# Patient Record
Sex: Female | Born: 1950
Health system: Southern US, Community
[De-identification: ages and names within clinical notes are randomized; demographics above are authoritative.]

## PROBLEM LIST (undated history)

## (undated) DIAGNOSIS — M1990 Unspecified osteoarthritis, unspecified site: Secondary | ICD-10-CM

## (undated) DIAGNOSIS — K5790 Diverticulosis of intestine, part unspecified, without perforation or abscess without bleeding: Secondary | ICD-10-CM

## (undated) DIAGNOSIS — J449 Chronic obstructive pulmonary disease, unspecified: Secondary | ICD-10-CM

## (undated) DIAGNOSIS — E119 Type 2 diabetes mellitus without complications: Secondary | ICD-10-CM

## (undated) DIAGNOSIS — R011 Cardiac murmur, unspecified: Secondary | ICD-10-CM

## (undated) DIAGNOSIS — N059 Unspecified nephritic syndrome with unspecified morphologic changes: Secondary | ICD-10-CM

## (undated) DIAGNOSIS — I10 Essential (primary) hypertension: Secondary | ICD-10-CM

## (undated) DIAGNOSIS — M1612 Unilateral primary osteoarthritis, left hip: Secondary | ICD-10-CM

## (undated) HISTORY — DX: Unspecified osteoarthritis, unspecified site: M19.90

## (undated) HISTORY — PX: TOTAL HIP ARTHROPLASTY: SHX124

---

## 1999-05-05 ENCOUNTER — Other Ambulatory Visit: Admission: RE | Admit: 1999-05-05 | Discharge: 1999-05-05 | Payer: Self-pay | Admitting: *Deleted

## 1999-10-03 ENCOUNTER — Encounter: Payer: Self-pay | Admitting: Family Medicine

## 1999-10-03 ENCOUNTER — Ambulatory Visit (HOSPITAL_COMMUNITY): Admission: RE | Admit: 1999-10-03 | Discharge: 1999-10-03 | Payer: Self-pay | Admitting: Family Medicine

## 1999-10-30 ENCOUNTER — Encounter: Payer: Self-pay | Admitting: *Deleted

## 1999-10-30 ENCOUNTER — Encounter: Admission: RE | Admit: 1999-10-30 | Discharge: 1999-10-30 | Payer: Self-pay | Admitting: *Deleted

## 2001-07-06 ENCOUNTER — Encounter: Payer: Self-pay | Admitting: *Deleted

## 2001-07-06 ENCOUNTER — Encounter: Admission: RE | Admit: 2001-07-06 | Discharge: 2001-07-06 | Payer: Self-pay | Admitting: *Deleted

## 2002-10-17 ENCOUNTER — Encounter: Admission: RE | Admit: 2002-10-17 | Discharge: 2002-10-17 | Payer: Self-pay | Admitting: *Deleted

## 2002-10-17 ENCOUNTER — Encounter: Payer: Self-pay | Admitting: *Deleted

## 2002-12-05 ENCOUNTER — Other Ambulatory Visit: Admission: RE | Admit: 2002-12-05 | Discharge: 2002-12-05 | Payer: Self-pay | Admitting: *Deleted

## 2004-04-20 ENCOUNTER — Encounter: Admission: RE | Admit: 2004-04-20 | Discharge: 2004-04-20 | Payer: Self-pay | Admitting: Family Medicine

## 2004-04-24 ENCOUNTER — Other Ambulatory Visit: Admission: RE | Admit: 2004-04-24 | Discharge: 2004-04-24 | Payer: Self-pay | Admitting: *Deleted

## 2004-04-29 ENCOUNTER — Encounter: Admission: RE | Admit: 2004-04-29 | Discharge: 2004-04-29 | Payer: Self-pay | Admitting: Family Medicine

## 2004-06-09 ENCOUNTER — Encounter: Admission: RE | Admit: 2004-06-09 | Discharge: 2004-09-07 | Payer: Self-pay

## 2004-07-22 ENCOUNTER — Encounter (INDEPENDENT_AMBULATORY_CARE_PROVIDER_SITE_OTHER): Payer: Self-pay | Admitting: Specialist

## 2004-07-22 ENCOUNTER — Ambulatory Visit (HOSPITAL_COMMUNITY): Admission: RE | Admit: 2004-07-22 | Discharge: 2004-07-22 | Payer: Self-pay | Admitting: Gastroenterology

## 2004-10-04 HISTORY — PX: COLONOSCOPY W/ POLYPECTOMY: SHX1380

## 2005-09-08 ENCOUNTER — Encounter: Admission: RE | Admit: 2005-09-08 | Discharge: 2005-09-08 | Payer: Self-pay | Admitting: Family Medicine

## 2006-05-06 ENCOUNTER — Other Ambulatory Visit: Admission: RE | Admit: 2006-05-06 | Discharge: 2006-05-06 | Payer: Self-pay | Admitting: *Deleted

## 2006-09-23 ENCOUNTER — Encounter: Admission: RE | Admit: 2006-09-23 | Discharge: 2006-09-23 | Payer: Self-pay | Admitting: Family Medicine

## 2007-09-14 ENCOUNTER — Other Ambulatory Visit: Admission: RE | Admit: 2007-09-14 | Discharge: 2007-09-14 | Payer: Self-pay | Admitting: Family Medicine

## 2007-10-30 ENCOUNTER — Encounter: Admission: RE | Admit: 2007-10-30 | Discharge: 2007-10-30 | Payer: Self-pay | Admitting: Family Medicine

## 2008-05-04 ENCOUNTER — Encounter: Admission: RE | Admit: 2008-05-04 | Discharge: 2008-05-04 | Payer: Self-pay | Admitting: Family Medicine

## 2008-09-17 ENCOUNTER — Other Ambulatory Visit: Admission: RE | Admit: 2008-09-17 | Discharge: 2008-09-17 | Payer: Self-pay | Admitting: Family Medicine

## 2008-11-06 ENCOUNTER — Encounter: Admission: RE | Admit: 2008-11-06 | Discharge: 2008-11-06 | Payer: Self-pay | Admitting: Family Medicine

## 2009-02-13 ENCOUNTER — Encounter: Admission: RE | Admit: 2009-02-13 | Discharge: 2009-02-13 | Payer: Self-pay | Admitting: Sports Medicine

## 2009-05-27 ENCOUNTER — Inpatient Hospital Stay (HOSPITAL_COMMUNITY): Admission: RE | Admit: 2009-05-27 | Discharge: 2009-05-30 | Payer: Self-pay | Admitting: Orthopedic Surgery

## 2009-11-19 ENCOUNTER — Encounter: Admission: RE | Admit: 2009-11-19 | Discharge: 2009-11-19 | Payer: Self-pay | Admitting: Family Medicine

## 2010-11-11 ENCOUNTER — Other Ambulatory Visit: Payer: Self-pay | Admitting: Family Medicine

## 2010-11-11 DIAGNOSIS — Z1231 Encounter for screening mammogram for malignant neoplasm of breast: Secondary | ICD-10-CM

## 2010-11-23 ENCOUNTER — Ambulatory Visit
Admission: RE | Admit: 2010-11-23 | Discharge: 2010-11-23 | Disposition: A | Discharge status: Home / Self Care | Payer: 59 | Source: Ambulatory Visit | Attending: Family Medicine | Admitting: Family Medicine

## 2010-11-23 DIAGNOSIS — Z1231 Encounter for screening mammogram for malignant neoplasm of breast: Secondary | ICD-10-CM

## 2011-01-09 LAB — BASIC METABOLIC PANEL
BUN: 3 mg/dL — ABNORMAL LOW (ref 6–23)
BUN: 4 mg/dL — ABNORMAL LOW (ref 6–23)
BUN: 7 mg/dL (ref 6–23)
CO2: 31 mEq/L (ref 19–32)
Calcium: 9.8 mg/dL (ref 8.4–10.5)
Chloride: 101 mEq/L (ref 96–112)
Chloride: 103 mEq/L (ref 96–112)
GFR calc Af Amer: >60 mL/min (ref >60)
GFR calc Af Amer: >60 mL/min (ref >60)
GFR calc non Af Amer: >60 mL/min (ref >60)
GFR calc non Af Amer: >60 mL/min (ref >60)
Glucose, Bld: 152 mg/dL — ABNORMAL HIGH (ref 70–99)
Glucose, Bld: 229 mg/dL — ABNORMAL HIGH (ref 70–99)
Potassium: 3.9 mEq/L (ref 3.5–5.1)
Potassium: 4 mEq/L (ref 3.5–5.1)
Potassium: 4.1 mEq/L (ref 3.5–5.1)
Potassium: 4.1 mEq/L (ref 3.5–5.1)
Sodium: 133 mEq/L — ABNORMAL LOW (ref 135–145)
Sodium: 134 mEq/L — ABNORMAL LOW (ref 135–145)
Sodium: 140 mEq/L (ref 135–145)

## 2011-01-09 LAB — URINALYSIS, ROUTINE W REFLEX MICROSCOPIC
Bilirubin Urine: NEGATIVE
Glucose, UA: 250 mg/dL — AB
Hgb urine dipstick: NEGATIVE
Ketones, ur: NEGATIVE mg/dL
Specific Gravity, Urine: 1.026 (ref 1.005–1.030)
pH: 5.5 (ref 5.0–8.0)

## 2011-01-09 LAB — CBC
HCT: 29 % — ABNORMAL LOW (ref 36.0–46.0)
Hemoglobin: 14.7 g/dL (ref 12.0–15.0)
MCHC: 34.1 g/dL (ref 30.0–36.0)
MCHC: 34.7 g/dL (ref 30.0–36.0)
MCV: 90.5 fL (ref 78.0–100.0)
MCV: 90.8 fL (ref 78.0–100.0)
Platelets: 246 10*3/uL (ref 150–400)
RBC: 3.19 MIL/uL — ABNORMAL LOW (ref 3.87–5.11)
RBC: 3.35 MIL/uL — ABNORMAL LOW (ref 3.87–5.11)
RBC: 3.37 MIL/uL — ABNORMAL LOW (ref 3.87–5.11)
RDW: 12.9 % (ref 11.5–15.5)
RDW: 13.4 % (ref 11.5–15.5)
RDW: 13.9 % (ref 11.5–15.5)
WBC: 10.6 10*3/uL — ABNORMAL HIGH (ref 4.0–10.5)
WBC: 11.2 10*3/uL — ABNORMAL HIGH (ref 4.0–10.5)
WBC: 9 10*3/uL (ref 4.0–10.5)

## 2011-01-09 LAB — TYPE AND SCREEN: ABO/RH(D): O POS

## 2011-01-09 LAB — PROTIME-INR
INR: 1.4 (ref 0.00–1.49)
Prothrombin Time: 13.7 seconds (ref 11.6–15.2)
Prothrombin Time: 17.2 seconds — ABNORMAL HIGH (ref 11.6–15.2)
Prothrombin Time: 17.2 seconds — ABNORMAL HIGH (ref 11.6–15.2)

## 2011-01-09 LAB — APTT: aPTT: 27 seconds (ref 24–37)

## 2011-02-16 NOTE — Discharge Summary (Signed)
NAME:  Melissa, Waters NO.:  [id number]   MEDICAL RECORD NO.:  [id number]          PATIENT TYPE:  INP   LOCATION:  5018                         FACILITY:  [facility]   PHYSICIAN:  Eulas Post, MD    DATE OF BIRTH:  08/13/1951   DATE OF ADMISSION:  05/27/2009  DATE OF DISCHARGE:  05/30/2009                               DISCHARGE SUMMARY   ADMISSION DIAGNOSIS:  Right hip osteoarthritis.   DISCHARGE DIAGNOSIS:  Right hip osteoarthritis.   PRIMARY PROCEDURE:  Right total hip arthroplasty performed on May 27, 2009.   DISCHARGE MEDICATIONS:  Coumadin, Phenergan, Percocet, and resume home  medications.   HOSPITAL COURSE:  Mrs. [name] is a 60 year old woman who had end-  stage right hip osteoarthritis.  She elected to undergo the above-named  procedures.  She tolerated the procedure well and did not have any  complications postoperatively.  Her pain was initially controlled on a  PCA Dilaudid, and then switched to p.o. analgesics.  She also received  perioperative Ancef for antimicrobial prophylaxis.  She had sequential  compression devices, early ambulation, and Coumadin for DVT prophylaxis.  She had made significant strides with physical therapy and her wounds  were clean, dry, and intact throughout her hospital stay.  Her  hemoglobin remained stable at approximately 10.  She benefited maximally  from her hospital stay and is being discharged home with followup with  me in approximately 2 weeks.  She will observe posterior total hip  precautions.      Eulas Post, MD  Electronically Signed     JPL/MEDQ  D:  05/29/2009  T:  05/30/2009  Job:  [id number]

## 2011-02-16 NOTE — Op Note (Signed)
NAME:  Melissa Waters, Melissa Waters NO.:  [id number]   MEDICAL RECORD NO.:  [id number]          PATIENT TYPE:  INP   LOCATION:  5018                         FACILITY:  [facility]   PHYSICIAN:  Eulas Post, MD    DATE OF BIRTH:  01-31-1951   DATE OF PROCEDURE:  05/27/2009  DATE OF DISCHARGE:                               OPERATIVE REPORT   ATTENDING SURGEON:  Eulas Post, MD   ASSISTANT:  Laural Benes. Su Hilt, PA-C   PREOPERATIVE DIAGNOSIS:  Right hip osteoarthritis.   POSTOPERATIVE DIAGNOSIS:  Right hip osteoarthritis.   OPERATIVE PROCEDURE:  Right total hip arthroplasty.   OPERATIVE IMPLANTS:  DePuy Pinnacle sector II size 50-mm acetabular cup  with a size 32-mm +4 10 degrees lipped acetabular liner pinnacle  marathon, and a single 6.5 x 25-mm cancellous bone screw with a size 4  standard femoral stem noncemented and an Articuleze +9 x 32-mm femoral  head.   PREOPERATIVE INDICATIONS:  Melissa Waters is a 60 year old woman who  complained of severe debilitating right hip pain.  She elected to  undergo a right total hip arthroplasty after failing conservative  measures.  The risks, benefits and alternatives were discussed  preoperatively including but not limited to risks of infection,  bleeding, nerve injury, the need for revision surgery, hip dislocation,  leg-length discrepancy, dislocation, cardiopulmonary complications,  blood clots, among others and she was willing to proceed.   OPERATIVE FINDINGS:  There was grade 4 chondromalacia of the femoral  head with severe deformity, as well as abnormalities on the acetabulum.   OPERATIVE PROCEDURE:  The patient was brought to the operating room and  placed in the supine position.  General anesthesia was administered.  Intravenous Ancef 1 g was given.  A Foley was placed.  The patient was  turned to the lateral decubitus position and all bony prominences were  padded.  Posterolateral approach was performed after a  sterile prep and  drape.  The external rotators were tagged along with the capsule.  A T-  type capsulotomy was performed.  The hip was dislocated and the femoral  neck resected.  She actually had a fairly short femoral neck.  I left  slightly less than a fingerbreadth, because a fingerbreadth actually was  the entire length of the femoral neck.  A cutting guide was also  utilized.  The femoral head was removed and the acetabulum exposed.  The  labrum was excised.  A metallic wing was used as a tractor.  This was  removed at the completion of the case.  The acetabulum was cleared of  all debris and the ligamentum teres removed.  I sequentially reamed up  to a size 49.  Initially, I placed the size 50 acetabular cup, but I  felt that the cup ended up somewhat flat, and I took an intraoperative x-  ray to confirm.  I then repositioned the cup into a more appropriate  position and secured this with the screw confirming the placement with  an additional x-ray.  On the first x-ray, I had also prepared the  femur  and trialed the +9, which eliminated the shock and provided good  stability and better restoration of leg length.  This was seen on the x-  ray as well.   Therefore, I did place a single 25 mm screw into the anterior most hole  on the acetabular cup, which was into the best bone stock.  This  provided satisfactory bite.  We trialed with a neutral as well as a  lipped liner and the lipped liner appeared to have better stability.   We had already prepared the femur.  We used the lateralizing reamer  followed by sequential broaching up to a size 4.  I did feel that she  could possibly have accommodated a 5, however, I did not want to push it  and had excellent stability with the 4 with rotation.  Therefore, the 4  was selected and after we had placed our acetabular liner, we impacted  our femoral component and placed the +9 femoral head.  The hip was now  extremely stable throughout  functional range of motion.  Leg length  restored to an equal position.  The wounds were irrigated copiously.  The capsule and external rotators were repaired through drill holes in  the greater tuberosity using FiberWire.  Excellent capsular closure was  achieved.  We then closed the fascia with Ethibond followed by 3-0  Vicryl for the subcutaneous tissue and 4-0 Monocryl with Steri-Strips  and sterile gauze.  The wounds were injected with Marcaine as well.  She  tolerated the procedure well and there were no complications.      Eulas Post, MD  Electronically Signed     JPL/MEDQ  D:  05/27/2009  T:  05/28/2009  Job:  [id number]

## 2011-02-19 NOTE — Op Note (Signed)
NAME:  Melissa Waters, Melissa Waters NO.:  [id number]   MEDICAL RECORD NO.:  [id number]          PATIENT TYPE:  AMB   LOCATION:  ENDO                         [facility]ACILITY:  [facility]   PHYSICIAN:  John C. Madilyn Fireman, M.D.    DATE OF BIRTH:  [birth year]   DATE OF PROCEDURE:  07/22/2004  DATE OF DISCHARGE:                                 OPERATIVE REPORT   PROCEDURE:  Colonoscopy.   INDICATIONS FOR PROCEDURE:  Average-risk colon cancer screening.   PROCEDURE:  The patient was placed in the left lateral decubitus position  and placed on the pulse monitor with continuous low flow oxygen delivered by  nasal cannula.  She was sedated with 62.5 mcg IV fentanyl and 6 mg IV  Versed.  The Olympus video colonoscope is inserted into the rectum and  advanced to the cecum, confirmed by transillumination at McBurney's point  and visualization of the ileocecal valve and appendiceal orifice.  Prep is  excellent.  The cecum, ascending, and transverse colon appeared normal with  no masses, polyps, diverticula, or other mucosal abnormalities.  Within the  descending colon and sigmoid colon, there were multiple diverticula, most  concentrated in the sigmoid.  In the proximal rectum, there were 3-4 small  polyps, no more than 5 mm in diameter, and the largest of these was hot  biopsied.  The remainder of the rectum appeared normal.  The scope was then  withdrawn, and the patient returned to the recovery room in stable  condition.  She tolerated the procedure well, and there were no immediate  complications.   IMPRESSION:  1.  Rectal polyp.  2.  Diverticulosis.   PLAN:  Await histology to determine method and interval for future colon  screening.      JCH/MEDQ  D:  07/22/2004  T:  07/22/2004  Job:  [id number]   cc:   Miguel Aschoff, M.D.  [address], [address]  [place]  [place] [address]  Fax: [phone]

## 2011-11-12 ENCOUNTER — Other Ambulatory Visit: Payer: Self-pay | Admitting: Family Medicine

## 2011-11-12 ENCOUNTER — Other Ambulatory Visit (HOSPITAL_COMMUNITY)
Admission: RE | Admit: 2011-11-12 | Discharge: 2011-11-12 | Disposition: A | Discharge status: Home / Self Care | Payer: 59 | Source: Ambulatory Visit | Attending: Family Medicine | Admitting: Family Medicine

## 2011-11-12 DIAGNOSIS — Z124 Encounter for screening for malignant neoplasm of cervix: Secondary | ICD-10-CM | POA: Insufficient documentation

## 2011-12-03 ENCOUNTER — Other Ambulatory Visit: Payer: Self-pay | Admitting: Family Medicine

## 2011-12-03 DIAGNOSIS — Z1231 Encounter for screening mammogram for malignant neoplasm of breast: Secondary | ICD-10-CM

## 2011-12-16 ENCOUNTER — Ambulatory Visit
Admission: RE | Admit: 2011-12-16 | Discharge: 2011-12-16 | Disposition: A | Discharge status: Home / Self Care | Payer: 59 | Source: Ambulatory Visit | Attending: Family Medicine | Admitting: Family Medicine

## 2011-12-16 DIAGNOSIS — Z1231 Encounter for screening mammogram for malignant neoplasm of breast: Secondary | ICD-10-CM

## 2013-01-04 ENCOUNTER — Other Ambulatory Visit: Payer: Self-pay

## 2013-01-04 DIAGNOSIS — Z1231 Encounter for screening mammogram for malignant neoplasm of breast: Secondary | ICD-10-CM

## 2013-02-01 ENCOUNTER — Ambulatory Visit
Admission: RE | Admit: 2013-02-01 | Discharge: 2013-02-01 | Disposition: A | Discharge status: Home / Self Care | Payer: 59 | Source: Ambulatory Visit

## 2013-02-01 DIAGNOSIS — Z1231 Encounter for screening mammogram for malignant neoplasm of breast: Secondary | ICD-10-CM

## 2013-05-23 ENCOUNTER — Encounter (HOSPITAL_COMMUNITY): Payer: Self-pay | Admitting: Pharmacy Technician

## 2013-05-26 NOTE — Pre-Procedure Instructions (Signed)
Melissa Waters  05/26/2013   Your procedure is scheduled on:  September 2  Report to [facility] at 05:30 AM.  Call this number if you have problems the morning of surgery: [phone]   Remember:   Do not eat food or drink liquids after midnight.   Take these medicines the morning of surgery with A SIP OF WATER: None   STOP Calcium, Cinnamon, Ibuprofen, Milk Thistle, Multiple Vitamins Wednesday August 27  Do not take Aspirin, Aleve, Naproxen, Advil, Ibuprofen, Vitamin, Herbs, or Supplements starting August 27  Do not wear jewelry, make-up or nail polish.  Do not wear lotions, powders, or perfumes. You may wear deodorant.  Do not shave 48 hours prior to surgery. Men may shave face and neck.  Do not bring valuables to the hospital.  [facility] is not responsible                   for any belongings or valuables.  Contacts, dentures or bridgework may not be worn into surgery.  Leave suitcase in the car. After surgery it may be brought to your room.  For patients admitted to the hospital, checkout time is 11:00 AM the day of  discharge.   Special Instructions: Shower using CHG 2 nights before surgery and the night before surgery.  If you shower the day of surgery use CHG.  Use special wash - you have one bottle of CHG for all showers.  You should use approximately 1/3 of the bottle for each shower.   Please read over the following fact sheets that you were given: Pain Booklet, Coughing and Deep Breathing, Blood Transfusion Information, Total Joint Packet and Surgical Site Infection Prevention

## 2013-05-28 ENCOUNTER — Encounter (HOSPITAL_COMMUNITY)
Admission: RE | Admit: 2013-05-28 | Discharge: 2013-05-28 | Disposition: A | Discharge status: Home / Self Care | Payer: 59 | Source: Ambulatory Visit | Attending: Orthopedic Surgery | Admitting: Orthopedic Surgery

## 2013-05-28 ENCOUNTER — Encounter (HOSPITAL_COMMUNITY): Payer: Self-pay

## 2013-05-28 ENCOUNTER — Encounter (HOSPITAL_COMMUNITY)
Admission: RE | Admit: 2013-05-28 | Discharge: 2013-05-28 | Disposition: A | Discharge status: Home / Self Care | Payer: 59 | Source: Ambulatory Visit | Attending: Anesthesiology | Admitting: Anesthesiology

## 2013-05-28 ENCOUNTER — Other Ambulatory Visit: Payer: Self-pay | Admitting: Orthopedic Surgery

## 2013-05-28 DIAGNOSIS — Z01811 Encounter for preprocedural respiratory examination: Secondary | ICD-10-CM | POA: Insufficient documentation

## 2013-05-28 DIAGNOSIS — Z01812 Encounter for preprocedural laboratory examination: Secondary | ICD-10-CM | POA: Insufficient documentation

## 2013-05-28 DIAGNOSIS — Z01818 Encounter for other preprocedural examination: Secondary | ICD-10-CM | POA: Insufficient documentation

## 2013-05-28 DIAGNOSIS — Z01810 Encounter for preprocedural cardiovascular examination: Secondary | ICD-10-CM | POA: Insufficient documentation

## 2013-05-28 HISTORY — DX: Essential (primary) hypertension: I10

## 2013-05-28 HISTORY — DX: Type 2 diabetes mellitus without complications: E11.9

## 2013-05-28 HISTORY — DX: Cardiac murmur, unspecified: R01.1

## 2013-05-28 HISTORY — DX: Chronic obstructive pulmonary disease, unspecified: J44.9

## 2013-05-28 LAB — BASIC METABOLIC PANEL
Calcium: 9.9 mg/dL (ref 8.4–10.5)
GFR calc non Af Amer: >90 mL/min (ref >90)
Sodium: 141 mEq/L (ref 135–145)

## 2013-05-28 LAB — PROTIME-INR: Prothrombin Time: 12.1 seconds (ref 11.6–15.2)

## 2013-05-28 LAB — APTT: aPTT: 30 seconds (ref 24–37)

## 2013-05-28 LAB — CBC
MCH: 31.6 pg (ref 26.0–34.0)
Platelets: 257 10*3/uL (ref 150–400)
RBC: 4.49 MIL/uL (ref 3.87–5.11)
RDW: 13.3 % (ref 11.5–15.5)
WBC: 8.2 10*3/uL (ref 4.0–10.5)

## 2013-05-28 LAB — SURGICAL PCR SCREEN: Staphylococcus aureus: NEGATIVE

## 2013-05-28 LAB — TYPE AND SCREEN: ABO/RH(D): O POS

## 2013-05-29 ENCOUNTER — Encounter (HOSPITAL_COMMUNITY): Payer: Self-pay | Admitting: Vascular Surgery

## 2013-05-29 NOTE — Progress Notes (Signed)
Anesthesia Chart Review:  Patient is a 62 year old female scheduled for left THA on 06/05/13 by Dr. Dion Saucier.  History includes smoking, HTN, COPD, diet controlled DM2, arthritis, mumur of infancy, right THA '10. BMI is 19.73. PCP is listed as Dr. Vianne Bulls.  EKg on 05/28/13 showed SB @ 58 bpm, marked sinus arrhythmia, T wave abnormality, consider inferolateral ischemia.  Anterior T wave abnormality has improved, but lateral T wave abnormality is new since 05/21/09.      CXR on 05/28/13 showed: Questionable nodule in the retrosternal clear space on the lateral examination only. CT of the chest is recommended without contrast for further evaluation.    Preoperative labs noted.  I reviewed above with anesthesiologist Dr. Michelle Piper.  Recommend preoperative cardiology evaluation due to abnormal EKG with multiple CAD risk factors.  I have notified Sherri at Dr. Shelba Flake office of need for cardiac preoperative evaluation and of abnormal CXR report.  She will review with Dr. Dion Saucier and make arrangements as necessary.  Velna Ochs [facility]/Anesthesiology Phone [phone] 05/29/2013 4:47 PM

## 2013-05-31 ENCOUNTER — Other Ambulatory Visit: Payer: Self-pay | Admitting: Orthopedic Surgery

## 2013-05-31 DIAGNOSIS — R911 Solitary pulmonary nodule: Secondary | ICD-10-CM

## 2013-05-31 DIAGNOSIS — M25551 Pain in right hip: Secondary | ICD-10-CM

## 2013-06-01 ENCOUNTER — Other Ambulatory Visit: Payer: 59

## 2013-06-01 ENCOUNTER — Ambulatory Visit
Admission: RE | Admit: 2013-06-01 | Discharge: 2013-06-01 | Disposition: A | Discharge status: Home / Self Care | Payer: 59 | Source: Ambulatory Visit | Attending: Orthopedic Surgery | Admitting: Orthopedic Surgery

## 2013-06-01 ENCOUNTER — Other Ambulatory Visit: Payer: Self-pay | Admitting: Orthopedic Surgery

## 2013-06-01 DIAGNOSIS — M25551 Pain in right hip: Secondary | ICD-10-CM

## 2013-06-01 DIAGNOSIS — R911 Solitary pulmonary nodule: Secondary | ICD-10-CM

## 2013-06-05 ENCOUNTER — Inpatient Hospital Stay (HOSPITAL_COMMUNITY): Admission: RE | Admit: 2013-06-05 | Payer: 59 | Source: Ambulatory Visit | Admitting: Orthopedic Surgery

## 2013-06-05 ENCOUNTER — Encounter (HOSPITAL_COMMUNITY): Admission: RE | Payer: Self-pay | Source: Ambulatory Visit

## 2013-06-05 SURGERY — ARTHROPLASTY, HIP, TOTAL,POSTERIOR APPROACH
Anesthesia: General | Laterality: Left

## 2013-06-06 ENCOUNTER — Encounter: Payer: Self-pay | Admitting: *Deleted

## 2013-06-11 ENCOUNTER — Ambulatory Visit (INDEPENDENT_AMBULATORY_CARE_PROVIDER_SITE_OTHER): Payer: 59 | Admitting: Cardiology

## 2013-06-11 ENCOUNTER — Encounter: Payer: Self-pay | Admitting: Cardiology

## 2013-06-11 VITALS — BP 126/66 | HR 68 | Ht 60.0 in | Wt 100.0 lb

## 2013-06-11 DIAGNOSIS — R9431 Abnormal electrocardiogram [ECG] [EKG]: Secondary | ICD-10-CM

## 2013-06-11 MED ORDER — CARVEDILOL 3.125 MG PO TABS: 3.1250 mg | ORAL_TABLET | Freq: Two times a day (BID) | ORAL | Status: DC

## 2013-06-11 NOTE — Progress Notes (Signed)
Patient ID: TEALE GOODGAME, female   DOB: 16-Jun-1951, 62 y.o.   MRN: [id number]   Patient Name: Melissa Waters Date of Encounter: 06/11/2013  Primary Care Provider:  Daisy Floro, MD Primary Cardiologist:  Tobias Alexander, MD  Patient Profile  Pre-operative evaluation  Problem List   Past Medical History  Diagnosis Date  . Hypertension   . Heart murmur     as a baby  . COPD (chronic obstructive pulmonary disease)   . Diabetes mellitus without complication     no meds   . Osteoarthritis     right hip   Past Surgical History  Procedure Laterality Date  . Total hip arthroplasty Right     05/27/2009  . Colonoscopy w/ polypectomy  2006    Allergies  Allergies  Allergen Reactions  . Amoxil [Amoxicillin] Diarrhea and Nausea And Vomiting    HPI  A delightful 62 year old female with h/o HTN, smoking, FH of CAD who was referred for a pre-operative evaluation. The patient was scheduled for a left hip replacement and as a part of pre-op exams an abnormal ECG was found. The patient is limited with physical activity by her hip pain but is walking with her dog without any chest pain or shortness of breath. She denies any prior chest pain, dizziness, syncope or palpitations as well as orthopnea or PND.  Home Medications  Prior to Admission medications   Medication Sig Start Date End Date Taking? Authorizing Provider  Calcium Carbonate-Vitamin D (CALCIUM 600 + D PO) Take 1 tablet by mouth daily.   Yes Historical Provider, MD  Carboxymethylcellul-Glycerin (CLEAR EYES FOR DRY EYES OP) Place 1 drop into both eyes 2 (two) times daily.   Yes Historical Provider, MD  Cinnamon 500 MG capsule Take 1,000 mg by mouth daily.   Yes Historical Provider, MD  clindamycin (CLEOCIN) 300 MG capsule Take 600 mg by mouth once as needed (prior to dental work).   Yes Historical Provider, MD  ibuprofen (ADVIL,MOTRIN) 200 MG tablet Take 400 mg by mouth 2 (two) times daily as needed for pain.   Yes  Historical Provider, MD  MILK THISTLE PO Take 350 mg by mouth daily.   Yes Historical Provider, MD  quinapril (ACCUPRIL) 20 MG tablet Take 20 mg by mouth daily before breakfast.    Yes Historical Provider, MD  carvedilol (COREG) 3.125 MG tablet Take 1 tablet (3.125 mg total) by mouth 2 (two) times daily. 06/11/13   Lars Masson, MD    Family History  Father - CABG in his 21' Mother - HTN Uncle - fatal MI in his 48'  Social History  History   Social History  . Marital Status: Married    Spouse Name: N/A    Number of Children: N/A  . Years of Education: N/A   Occupational History  . Not on file.   Social History Main Topics  . Smoking status: Current Every Day Smoker -- 0.20 packs/day for 44 years    Types: Cigarettes  . Smokeless tobacco: Never Used  . Alcohol Use: Yes     Comment: social  . Drug Use: No  . Sexual Activity: Not on file   Other Topics Concern  . Not on file   Social History Narrative  . No narrative on file     Review of Systems General:  No chills, fever, night sweats or weight changes.  Cardiovascular:  No chest pain, dyspnea on exertion, edema, orthopnea, palpitations, paroxysmal nocturnal dyspnea. Dermatological:  No rash, lesions/masses Respiratory: No cough, dyspnea Urologic: No hematuria, dysuria Abdominal:   No nausea, vomiting, diarrhea, bright red blood per rectum, melena, or hematemesis Neurologic:  No visual changes, wkns, changes in mental status. All other systems reviewed and are otherwise negative except as noted above.  Physical Exam  Blood pressure 126/66, pulse 68, height 5' (1.524 m), weight 100 lb (45.36 kg).  General: Pleasant, NAD Psych: Normal affect. Neuro: Alert and oriented X 3. Moves all extremities spontaneously. HEENT: Normal  Neck: Supple without bruits or JVD. Lungs:  Resp regular and unlabored, CTA. Heart: RRR no s3, s4, or murmurs. Abdomen: Soft, non-tender, non-distended, BS + x 4.  Extremities: No  clubbing, cyanosis or edema. DP/PT/Radials 2+ and equal bilaterally.  Accessory Clinical Findings  ECG - SR, HR 68/minute, PR 152, QRS 92, QT 418 ms, negative T waves in V3-5, nonspecific changes in I, II,III  Assessment & Plan  62 year old female in need for hip replacement (intermediate risk surgery) with abnormal ECG suggesting possible ischemia. The patient has risk factors including family history of CAD, HTN and ongoing smoking. She has no prior history of CAD. She is completely asymptomatic, has no active cardiac condition and despite her orthopedic limitations she is able to reach at least 4 METS without any symptoms. She feels that postponing the surgery would limit her daily activities. Therefore we will start her on carvedilol 3.125 mg PO BID that she should continue during and after the surgery. We won't proceed any any further testing prior to the surgery unless there is change in her condition or symptoms.  We will follow the patient in our clinic shortly after the surgery.   Tobias Alexander, Rexene Edison, MD 06/11/2013, 1:36 PM

## 2013-06-11 NOTE — Patient Instructions (Addendum)
Fasting Lab  ( lipid panel,tsh ) 06/12/13 7:30 am to 12:00 noon,nothing to eat or drink after midnight tonight 06/11/13.    Start Coreg 3.125 mg twice a day    Your physician recommends that you schedule a follow-up appointment in: 6 weeks

## 2013-06-12 ENCOUNTER — Other Ambulatory Visit (INDEPENDENT_AMBULATORY_CARE_PROVIDER_SITE_OTHER): Payer: 59

## 2013-06-12 DIAGNOSIS — R9431 Abnormal electrocardiogram [ECG] [EKG]: Secondary | ICD-10-CM

## 2013-06-12 LAB — TSH: TSH: 1.21 u[IU]/mL (ref 0.35–5.50)

## 2013-06-12 LAB — LIPID PANEL
Cholesterol: 180 mg/dL (ref 0–200)
HDL: 95.8 mg/dL (ref >39.00)
LDL Cholesterol: 78 mg/dL (ref 0–99)
Total CHOL/HDL Ratio: 2
Triglycerides: 30 mg/dL (ref 0.0–149.0)
VLDL: 6 mg/dL (ref 0.0–40.0)

## 2013-06-21 ENCOUNTER — Ambulatory Visit
Admission: RE | Admit: 2013-06-21 | Discharge: 2013-06-21 | Disposition: A | Discharge status: Home / Self Care | Payer: 59 | Source: Ambulatory Visit | Attending: Orthopedic Surgery | Admitting: Orthopedic Surgery

## 2013-06-21 DIAGNOSIS — M25551 Pain in right hip: Secondary | ICD-10-CM

## 2013-06-21 DIAGNOSIS — R911 Solitary pulmonary nodule: Secondary | ICD-10-CM

## 2013-06-29 ENCOUNTER — Telehealth: Payer: Self-pay | Admitting: Cardiology

## 2013-06-29 NOTE — Telephone Encounter (Signed)
New problem  Pt states that she was advised to have a stress test before her surgery/// she believe her scheduled OVt was a stress test and it is not... There are no orders for a stress test.. Request a call back to discuss why she was not scheduled for a stress test.

## 2013-06-29 NOTE — Telephone Encounter (Signed)
LMTCB

## 2013-07-02 NOTE — Telephone Encounter (Signed)
**Note De-Identified Melissa Waters Obfuscation** Pt thought that she had to have a stress test in order to be cleared for surgery. According to last OV notes Dr Delton See did not feel that she needed a stress test to be cleared for surgery. Pt was to f/u with Dr Delton See on 10/21 (shortly after surgery) but the pt states that her surgery has not been scheduled and will not be until November. She asked that I cancel the 10/21 appt and she states that she will call and reschedule when she finds out when surgery is. Also, last OV notes faxed to Dr. Dion Saucier (surgeon).

## 2013-07-02 NOTE — Telephone Encounter (Signed)
Follow up  ° ° °Returning call back to nurse from Friday  °

## 2013-07-05 ENCOUNTER — Telehealth: Payer: Self-pay | Admitting: Cardiology

## 2013-07-05 NOTE — Telephone Encounter (Signed)
Pt  was told to call with date of surgery,  08-14-13 , would like to talk with lynn, knows it will be tomorrow

## 2013-07-11 NOTE — Telephone Encounter (Signed)
**Note De-identified Melissa Waters Obfuscation** LMTCB

## 2013-07-17 NOTE — Telephone Encounter (Signed)
**Note De-Identified Cinthya Bors Obfuscation** LMTCB. Need to schedule a f/u appt with Dr Delton See after surgery.

## 2013-07-24 ENCOUNTER — Ambulatory Visit: Payer: 59 | Admitting: Cardiology

## 2013-07-27 ENCOUNTER — Telehealth: Payer: Self-pay | Admitting: Cardiology

## 2013-07-27 NOTE — Telephone Encounter (Signed)
New Problem:  Pt states she was wanting to speak to [facility] about her hip surgery and when she would need to follow up w/ Dr. Delton See. Please advise

## 2013-07-31 NOTE — Telephone Encounter (Signed)
Left detailed message on pts VM stating that she can call to schedule her F/u appt with Dr Delton See for 1 to 2 weeks after her surgery with anyone in this office as Im having a hard time reaching her.

## 2013-08-02 ENCOUNTER — Encounter (HOSPITAL_COMMUNITY): Payer: Self-pay | Admitting: Pharmacy Technician

## 2013-08-06 ENCOUNTER — Encounter (HOSPITAL_COMMUNITY): Payer: Self-pay

## 2013-08-06 ENCOUNTER — Encounter (HOSPITAL_COMMUNITY)
Admission: RE | Admit: 2013-08-06 | Discharge: 2013-08-06 | Disposition: A | Discharge status: Home / Self Care | Payer: 59 | Source: Ambulatory Visit | Attending: Orthopedic Surgery | Admitting: Orthopedic Surgery

## 2013-08-06 DIAGNOSIS — Z01812 Encounter for preprocedural laboratory examination: Secondary | ICD-10-CM | POA: Insufficient documentation

## 2013-08-06 DIAGNOSIS — Z01818 Encounter for other preprocedural examination: Secondary | ICD-10-CM | POA: Insufficient documentation

## 2013-08-06 HISTORY — DX: Unspecified nephritic syndrome with unspecified morphologic changes: N05.9

## 2013-08-06 HISTORY — DX: Diverticulosis of intestine, part unspecified, without perforation or abscess without bleeding: K57.90

## 2013-08-06 LAB — PROTIME-INR
INR: 0.96 (ref 0.00–1.49)
Prothrombin Time: 12.6 seconds (ref 11.6–15.2)

## 2013-08-06 LAB — CBC
MCH: 31.3 pg (ref 26.0–34.0)
Platelets: 252 10*3/uL (ref 150–400)
RBC: 4.48 MIL/uL (ref 3.87–5.11)
RDW: 13.1 % (ref 11.5–15.5)

## 2013-08-06 LAB — BASIC METABOLIC PANEL
CO2: 28 mEq/L (ref 19–32)
Calcium: 9.6 mg/dL (ref 8.4–10.5)
Creatinine, Ser: 0.41 mg/dL — ABNORMAL LOW (ref 0.50–1.10)
GFR calc Af Amer: >90 mL/min (ref >90)
GFR calc non Af Amer: >90 mL/min (ref >90)
Sodium: 141 mEq/L (ref 135–145)

## 2013-08-06 LAB — SURGICAL PCR SCREEN: Staphylococcus aureus: NEGATIVE

## 2013-08-06 NOTE — Progress Notes (Signed)
Anesthesia follow-up:  See my noted from 05/28/13.  Since then patient was referred to cardiologist Dr. Tobias Alexander. According to notes from 06/11/13, no further cardiac testing was ordered at that time because patient was asymptomatic from a CV standpoint and could reach at least 4 METS without any symptoms.  She was started on low dose Coreg to continue during the perioperative period.  Her PCP Dr. Duane Lope also cleared her from a medical standpoint.  Of noted, she had an abnormal CXR on 05/28/13 but chest CT was done on 06/01/13 and showed: No evidence of pulmonary mass lesion. Mild well-defined interstitial densities medial inferior anterior right middle lobe most consistent with scarring.  Preoperative labs noted.  If no acute changes then I would anticipate that she could proceed as planned.    Velna Ochs [facility]/Anesthesiology Phone [phone] 08/06/2013 6:30 PM

## 2013-08-07 LAB — TYPE AND SCREEN: Antibody Screen: NEGATIVE

## 2013-08-09 ENCOUNTER — Other Ambulatory Visit: Payer: Self-pay

## 2013-08-12 MED ORDER — VANCOMYCIN HCL IN DEXTROSE 1-5 GM/200ML-% IV SOLN
1000.0000 mg | INTRAVENOUS | Status: AC
  Administered 2013-08-13: 1000 mg via INTRAVENOUS

## 2013-08-13 ENCOUNTER — Encounter (HOSPITAL_COMMUNITY): Payer: Self-pay | Admitting: *Deleted

## 2013-08-13 ENCOUNTER — Inpatient Hospital Stay (HOSPITAL_COMMUNITY): Payer: 59

## 2013-08-13 ENCOUNTER — Encounter (HOSPITAL_COMMUNITY): Payer: 59 | Admitting: Vascular Surgery

## 2013-08-13 ENCOUNTER — Encounter (HOSPITAL_COMMUNITY)
Admission: RE | Disposition: A | Discharge status: Home Health | Payer: Self-pay | Source: Ambulatory Visit | Attending: Orthopedic Surgery

## 2013-08-13 ENCOUNTER — Inpatient Hospital Stay (HOSPITAL_COMMUNITY): Payer: 59 | Admitting: Certified Registered Nurse Anesthetist

## 2013-08-13 ENCOUNTER — Inpatient Hospital Stay (HOSPITAL_COMMUNITY)
Admission: RE | Admit: 2013-08-13 | Discharge: 2013-08-15 | DRG: 470 | Disposition: A | Discharge status: Home Health | Payer: 59 | Source: Ambulatory Visit | Attending: Orthopedic Surgery | Admitting: Orthopedic Surgery

## 2013-08-13 DIAGNOSIS — M1612 Unilateral primary osteoarthritis, left hip: Secondary | ICD-10-CM | POA: Diagnosis present

## 2013-08-13 DIAGNOSIS — E119 Type 2 diabetes mellitus without complications: Secondary | ICD-10-CM | POA: Diagnosis present

## 2013-08-13 DIAGNOSIS — Z23 Encounter for immunization: Secondary | ICD-10-CM

## 2013-08-13 DIAGNOSIS — F17200 Nicotine dependence, unspecified, uncomplicated: Secondary | ICD-10-CM | POA: Diagnosis present

## 2013-08-13 DIAGNOSIS — J4489 Other specified chronic obstructive pulmonary disease: Secondary | ICD-10-CM | POA: Diagnosis present

## 2013-08-13 DIAGNOSIS — Z7982 Long term (current) use of aspirin: Secondary | ICD-10-CM

## 2013-08-13 DIAGNOSIS — I10 Essential (primary) hypertension: Secondary | ICD-10-CM | POA: Diagnosis present

## 2013-08-13 DIAGNOSIS — M1610 Unilateral primary osteoarthritis, unspecified hip: Principal | ICD-10-CM | POA: Diagnosis present

## 2013-08-13 DIAGNOSIS — M169 Osteoarthritis of hip, unspecified: Principal | ICD-10-CM | POA: Diagnosis present

## 2013-08-13 DIAGNOSIS — J449 Chronic obstructive pulmonary disease, unspecified: Secondary | ICD-10-CM | POA: Diagnosis present

## 2013-08-13 DIAGNOSIS — Z79899 Other long term (current) drug therapy: Secondary | ICD-10-CM

## 2013-08-13 DIAGNOSIS — Z96649 Presence of unspecified artificial hip joint: Secondary | ICD-10-CM

## 2013-08-13 HISTORY — DX: Unilateral primary osteoarthritis, left hip: M16.12

## 2013-08-13 HISTORY — PX: TOTAL HIP ARTHROPLASTY: SHX124

## 2013-08-13 LAB — CBC
Hemoglobin: 13 g/dL (ref 12.0–15.0)
MCH: 31.2 pg (ref 26.0–34.0)
MCHC: 33.9 g/dL (ref 30.0–36.0)
MCV: 90 fL (ref 78.0–100.0)
Platelets: 226 10*3/uL (ref 150–400)
Platelets: 219 10*3/uL (ref 150–400)
RDW: 13.1 % (ref 11.5–15.5)
RDW: 13 % (ref 11.5–15.5)

## 2013-08-13 LAB — BASIC METABOLIC PANEL
BUN: 11 mg/dL (ref 6–23)
GFR calc Af Amer: >90 mL/min (ref >90)
GFR calc non Af Amer: >90 mL/min (ref >90)
Glucose, Bld: 122 mg/dL — ABNORMAL HIGH (ref 70–99)
Potassium: 3.9 mEq/L (ref 3.5–5.1)
Sodium: 140 mEq/L (ref 135–145)

## 2013-08-13 LAB — GLUCOSE, CAPILLARY
Glucose-Capillary: 137 mg/dL — ABNORMAL HIGH (ref 70–99)
Glucose-Capillary: 149 mg/dL — ABNORMAL HIGH (ref 70–99)

## 2013-08-13 LAB — CREATININE, SERUM: GFR calc non Af Amer: >90 mL/min (ref >90)

## 2013-08-13 SURGERY — ARTHROPLASTY, HIP, TOTAL,POSTERIOR APPROACH
Anesthesia: General | Site: Hip | Laterality: Left | Wound class: Clean

## 2013-08-13 MED ORDER — ENOXAPARIN SODIUM 40 MG/0.4ML ~~LOC~~ SOLN
40.0000 mg | SUBCUTANEOUS | Status: DC
  Administered 2013-08-14 – 2013-08-15 (×2): 40 mg via SUBCUTANEOUS
  Filled 2013-08-13 (×4): qty 0.4

## 2013-08-13 MED ORDER — HYDROMORPHONE HCL PF 1 MG/ML IJ SOLN: 0.5000 mg | INTRAMUSCULAR | Status: DC | PRN

## 2013-08-13 MED ORDER — ENOXAPARIN SODIUM 40 MG/0.4ML ~~LOC~~ SOLN: 40.0000 mg | SUBCUTANEOUS | Status: DC

## 2013-08-13 MED ORDER — CARVEDILOL 3.125 MG PO TABS
3.1250 mg | ORAL_TABLET | Freq: Two times a day (BID) | ORAL | Status: DC
  Administered 2013-08-13 – 2013-08-15 (×4): 3.125 mg via ORAL
  Filled 2013-08-13 (×5): qty 1

## 2013-08-13 MED ORDER — ZOLPIDEM TARTRATE 5 MG PO TABS: 5.0000 mg | ORAL_TABLET | Freq: Every evening | ORAL | Status: DC | PRN

## 2013-08-13 MED ORDER — MEPERIDINE HCL 25 MG/ML IJ SOLN: 6.2500 mg | INTRAMUSCULAR | Status: DC | PRN

## 2013-08-13 MED ORDER — LACTATED RINGERS IV SOLN
INTRAVENOUS | Status: DC | PRN
  Administered 2013-08-13 (×2): via INTRAVENOUS

## 2013-08-13 MED ORDER — PROPOFOL 10 MG/ML IV BOLUS
INTRAVENOUS | Status: DC | PRN
  Administered 2013-08-13: 100 mg via INTRAVENOUS

## 2013-08-13 MED ORDER — PHENYLEPHRINE HCL 10 MG/ML IJ SOLN
INTRAMUSCULAR | Status: DC | PRN
  Administered 2013-08-13 (×2): 80 ug via INTRAVENOUS

## 2013-08-13 MED ORDER — DOCUSATE SODIUM 100 MG PO CAPS
100.0000 mg | ORAL_CAPSULE | Freq: Two times a day (BID) | ORAL | Status: DC
  Administered 2013-08-13 – 2013-08-15 (×4): 100 mg via ORAL
  Filled 2013-08-13 (×4): qty 1

## 2013-08-13 MED ORDER — HYDROMORPHONE HCL PF 1 MG/ML IJ SOLN
INTRAMUSCULAR | Status: AC
  Filled 2013-08-13: qty 1

## 2013-08-13 MED ORDER — BISACODYL 5 MG PO TBEC: 5.0000 mg | DELAYED_RELEASE_TABLET | Freq: Every day | ORAL | Status: DC | PRN

## 2013-08-13 MED ORDER — OXYCODONE-ACETAMINOPHEN 5-325 MG PO TABS: 1.0000 | ORAL_TABLET | Freq: Four times a day (QID) | ORAL | Status: DC | PRN

## 2013-08-13 MED ORDER — PROMETHAZINE HCL 25 MG/ML IJ SOLN: 6.2500 mg | INTRAMUSCULAR | Status: DC | PRN

## 2013-08-13 MED ORDER — METOCLOPRAMIDE HCL 10 MG PO TABS: 5.0000 mg | ORAL_TABLET | Freq: Three times a day (TID) | ORAL | Status: DC | PRN

## 2013-08-13 MED ORDER — QUINAPRIL HCL 10 MG PO TABS
20.0000 mg | ORAL_TABLET | Freq: Every day | ORAL | Status: DC
  Administered 2013-08-14 – 2013-08-15 (×2): 20 mg via ORAL
  Filled 2013-08-13 (×3): qty 2

## 2013-08-13 MED ORDER — CLINDAMYCIN PHOSPHATE 900 MG/50ML IV SOLN
900.0000 mg | INTRAVENOUS | Status: DC
  Filled 2013-08-13: qty 50

## 2013-08-13 MED ORDER — SODIUM CHLORIDE 0.9 % IR SOLN
Status: DC | PRN
  Administered 2013-08-13: 1000 mL

## 2013-08-13 MED ORDER — OXYCODONE HCL 5 MG/5ML PO SOLN: 5.0000 mg | Freq: Once | ORAL | Status: DC | PRN

## 2013-08-13 MED ORDER — ACETAMINOPHEN 325 MG PO TABS
650.0000 mg | ORAL_TABLET | Freq: Four times a day (QID) | ORAL | Status: DC | PRN
  Administered 2013-08-14 (×2): 650 mg via ORAL
  Filled 2013-08-13 (×2): qty 2

## 2013-08-13 MED ORDER — DIPHENHYDRAMINE HCL 12.5 MG/5ML PO ELIX: 12.5000 mg | ORAL_SOLUTION | ORAL | Status: DC | PRN

## 2013-08-13 MED ORDER — MIDAZOLAM HCL 5 MG/5ML IJ SOLN
INTRAMUSCULAR | Status: DC | PRN
  Administered 2013-08-13: 2 mg via INTRAVENOUS

## 2013-08-13 MED ORDER — BISACODYL 10 MG RE SUPP: 10.0000 mg | Freq: Every day | RECTAL | Status: DC | PRN

## 2013-08-13 MED ORDER — POTASSIUM CHLORIDE IN NACL 20-0.45 MEQ/L-% IV SOLN
INTRAVENOUS | Status: DC
  Administered 2013-08-13 – 2013-08-14 (×2): via INTRAVENOUS
  Filled 2013-08-13 (×5): qty 1000

## 2013-08-13 MED ORDER — VANCOMYCIN HCL IN DEXTROSE 1-5 GM/200ML-% IV SOLN
INTRAVENOUS | Status: AC
  Filled 2013-08-13: qty 200

## 2013-08-13 MED ORDER — SENNA-DOCUSATE SODIUM 8.6-50 MG PO TABS: 2.0000 | ORAL_TABLET | Freq: Every day | ORAL | Status: DC

## 2013-08-13 MED ORDER — POLYETHYLENE GLYCOL 3350 17 G PO PACK: 17.0000 g | PACK | Freq: Every day | ORAL | Status: DC | PRN

## 2013-08-13 MED ORDER — METOCLOPRAMIDE HCL 5 MG/ML IJ SOLN: 5.0000 mg | Freq: Three times a day (TID) | INTRAMUSCULAR | Status: DC | PRN

## 2013-08-13 MED ORDER — OXYCODONE HCL 5 MG PO TABS
5.0000 mg | ORAL_TABLET | ORAL | Status: DC | PRN
  Administered 2013-08-13 – 2013-08-15 (×7): 10 mg via ORAL
  Filled 2013-08-13 (×7): qty 2

## 2013-08-13 MED ORDER — ACETAMINOPHEN 650 MG RE SUPP: 650.0000 mg | Freq: Four times a day (QID) | RECTAL | Status: DC | PRN

## 2013-08-13 MED ORDER — ROCURONIUM BROMIDE 100 MG/10ML IV SOLN
INTRAVENOUS | Status: DC | PRN
  Administered 2013-08-13: 40 mg via INTRAVENOUS

## 2013-08-13 MED ORDER — ONDANSETRON HCL 4 MG/2ML IJ SOLN: 4.0000 mg | Freq: Four times a day (QID) | INTRAMUSCULAR | Status: DC | PRN

## 2013-08-13 MED ORDER — METHOCARBAMOL 100 MG/ML IJ SOLN
500.0000 mg | Freq: Four times a day (QID) | INTRAMUSCULAR | Status: DC | PRN
  Filled 2013-08-13: qty 5

## 2013-08-13 MED ORDER — METHOCARBAMOL 500 MG PO TABS
500.0000 mg | ORAL_TABLET | Freq: Four times a day (QID) | ORAL | Status: DC | PRN
  Administered 2013-08-13 – 2013-08-15 (×3): 500 mg via ORAL
  Filled 2013-08-13 (×3): qty 1

## 2013-08-13 MED ORDER — SENNA 8.6 MG PO TABS
1.0000 | ORAL_TABLET | Freq: Two times a day (BID) | ORAL | Status: DC
  Administered 2013-08-13 – 2013-08-15 (×4): 8.6 mg via ORAL
  Filled 2013-08-13 (×6): qty 1

## 2013-08-13 MED ORDER — ALUM & MAG HYDROXIDE-SIMETH 200-200-20 MG/5ML PO SUSP: 30.0000 mL | ORAL | Status: DC | PRN

## 2013-08-13 MED ORDER — NEOSTIGMINE METHYLSULFATE 1 MG/ML IJ SOLN
INTRAMUSCULAR | Status: DC | PRN
  Administered 2013-08-13: 3 mg via INTRAVENOUS

## 2013-08-13 MED ORDER — GLYCOPYRROLATE 0.2 MG/ML IJ SOLN
INTRAMUSCULAR | Status: DC | PRN
  Administered 2013-08-13: 0.4 mg via INTRAVENOUS

## 2013-08-13 MED ORDER — FENTANYL CITRATE 0.05 MG/ML IJ SOLN
INTRAMUSCULAR | Status: DC | PRN
  Administered 2013-08-13: 150 ug via INTRAVENOUS
  Administered 2013-08-13 (×2): 50 ug via INTRAVENOUS

## 2013-08-13 MED ORDER — LACTATED RINGERS IV SOLN
INTRAVENOUS | Status: DC
  Administered 2013-08-13: 12:00:00 via INTRAVENOUS

## 2013-08-13 MED ORDER — BUPIVACAINE HCL 0.5 % IJ SOLN
INTRAMUSCULAR | Status: DC | PRN
  Administered 2013-08-13: 20 mL

## 2013-08-13 MED ORDER — POLYVINYL ALCOHOL 1.4 % OP SOLN
1.0000 [drp] | Freq: Two times a day (BID) | OPHTHALMIC | Status: DC
  Administered 2013-08-13 – 2013-08-15 (×4): 1 [drp] via OPHTHALMIC
  Filled 2013-08-13: qty 15

## 2013-08-13 MED ORDER — ONDANSETRON HCL 4 MG/2ML IJ SOLN
INTRAMUSCULAR | Status: DC | PRN
  Administered 2013-08-13: 4 mg via INTRAVENOUS

## 2013-08-13 MED ORDER — OXYCODONE HCL 5 MG PO TABS: 5.0000 mg | ORAL_TABLET | Freq: Once | ORAL | Status: DC | PRN

## 2013-08-13 MED ORDER — PHENOL 1.4 % MT LIQD: 1.0000 | OROMUCOSAL | Status: DC | PRN

## 2013-08-13 MED ORDER — HYDROMORPHONE HCL PF 1 MG/ML IJ SOLN
0.2500 mg | INTRAMUSCULAR | Status: DC | PRN
  Administered 2013-08-13 (×2): 0.5 mg via INTRAVENOUS

## 2013-08-13 MED ORDER — ONDANSETRON HCL 4 MG PO TABS: 4.0000 mg | ORAL_TABLET | Freq: Four times a day (QID) | ORAL | Status: DC | PRN

## 2013-08-13 MED ORDER — LIDOCAINE HCL (CARDIAC) 20 MG/ML IV SOLN
INTRAVENOUS | Status: DC | PRN
  Administered 2013-08-13: 20 mg via INTRAVENOUS

## 2013-08-13 MED ORDER — MENTHOL 3 MG MT LOZG: 1.0000 | LOZENGE | OROMUCOSAL | Status: DC | PRN

## 2013-08-13 MED ORDER — CARBOXYMETHYLCELLUL-GLYCERIN 1-0.25 % OP SOLN: 1.0000 [drp] | Freq: Two times a day (BID) | OPHTHALMIC | Status: DC

## 2013-08-13 MED ORDER — MAGNESIUM CITRATE PO SOLN
1.0000 | Freq: Once | ORAL | Status: AC | PRN
  Filled 2013-08-13: qty 296

## 2013-08-13 SURGICAL SUPPLY — 58 items
BENZOIN TINCTURE PRP APPL 2/3 (GAUZE/BANDAGES/DRESSINGS) ×2 IMPLANT
BLADE SAW SAG 73X25 THK (BLADE) ×1
BLADE SAW SGTL 73X25 THK (BLADE) ×1 IMPLANT
BRUSH FEMORAL CANAL (MISCELLANEOUS) IMPLANT
CAPT HIP PF MOP ×2 IMPLANT
CLOTH BEACON ORANGE TIMEOUT ST (SAFETY) ×2 IMPLANT
COVER BACK TABLE 24X17X13 BIG (DRAPES) IMPLANT
COVER SURGICAL LIGHT HANDLE (MISCELLANEOUS) ×2 IMPLANT
DRAPE INCISE IOBAN 66X45 STRL (DRAPES) IMPLANT
DRAPE ORTHO SPLIT 77X108 STRL (DRAPES) ×2
DRAPE PROXIMA HALF (DRAPES) ×2 IMPLANT
DRAPE SURG ORHT 6 SPLT 77X108 (DRAPES) ×2 IMPLANT
DRAPE U-SHAPE 47X51 STRL (DRAPES) ×2 IMPLANT
DRILL BIT 5/64 (BIT) ×2 IMPLANT
DRSG MEPILEX BORDER 4X12 (GAUZE/BANDAGES/DRESSINGS) IMPLANT
DRSG MEPILEX BORDER 4X8 (GAUZE/BANDAGES/DRESSINGS) ×2 IMPLANT
DRSG PAD ABDOMINAL 8X10 ST (GAUZE/BANDAGES/DRESSINGS) IMPLANT
DURAPREP 26ML APPLICATOR (WOUND CARE) ×2 IMPLANT
ELECT CAUTERY BLADE 6.4 (BLADE) ×2 IMPLANT
ELECT REM PT RETURN 9FT ADLT (ELECTROSURGICAL) ×2
ELECTRODE REM PT RTRN 9FT ADLT (ELECTROSURGICAL) ×1 IMPLANT
GLOVE BIOGEL PI ORTHO PRO SZ8 (GLOVE) ×1
GLOVE ORTHO TXT STRL SZ7.5 (GLOVE) ×2 IMPLANT
GLOVE PI ORTHO PRO STRL SZ8 (GLOVE) ×1 IMPLANT
GLOVE SURG ORTHO 8.0 STRL STRW (GLOVE) ×4 IMPLANT
GOWN STRL NON-REIN LRG LVL3 (GOWN DISPOSABLE) IMPLANT
HANDPIECE INTERPULSE COAX TIP (DISPOSABLE)
HEAD FEM STD 32X+9 STRL (Hips) ×2 IMPLANT
HOOD PEEL AWAY FACE SHEILD DIS (HOOD) ×4 IMPLANT
KIT BASIN OR (CUSTOM PROCEDURE TRAY) ×2 IMPLANT
KIT ROOM TURNOVER OR (KITS) ×2 IMPLANT
MANIFOLD NEPTUNE II (INSTRUMENTS) ×2 IMPLANT
NEEDLE HYPO 25GX1X1/2 BEV (NEEDLE) ×2 IMPLANT
NS IRRIG 1000ML POUR BTL (IV SOLUTION) ×2 IMPLANT
PACK TOTAL JOINT (CUSTOM PROCEDURE TRAY) ×2 IMPLANT
PAD ARMBOARD 7.5X6 YLW CONV (MISCELLANEOUS) ×4 IMPLANT
PILLOW ABDUCTION HIP (SOFTGOODS) IMPLANT
PRESSURIZER FEMORAL UNIV (MISCELLANEOUS) IMPLANT
RETRIEVER SUT HEWSON (MISCELLANEOUS) ×2 IMPLANT
SET HNDPC FAN SPRY TIP SCT (DISPOSABLE) IMPLANT
SPONGE GAUZE 4X4 12PLY (GAUZE/BANDAGES/DRESSINGS) ×2 IMPLANT
SPONGE LAP 4X18 X RAY DECT (DISPOSABLE) IMPLANT
STRIP CLOSURE SKIN 1/2X4 (GAUZE/BANDAGES/DRESSINGS) ×4 IMPLANT
SUCTION FRAZIER TIP 10 FR DISP (SUCTIONS) ×2 IMPLANT
SUT FIBERWIRE #2 38 REV NDL BL (SUTURE) ×6
SUT MNCRL AB 4-0 PS2 18 (SUTURE) IMPLANT
SUT VIC AB 0 CT1 27 (SUTURE) ×1
SUT VIC AB 0 CT1 27XBRD ANBCTR (SUTURE) ×1 IMPLANT
SUT VIC AB 2-0 CT1 27 (SUTURE) ×1
SUT VIC AB 2-0 CT1 TAPERPNT 27 (SUTURE) ×1 IMPLANT
SUT VIC AB 3-0 SH 8-18 (SUTURE) ×2 IMPLANT
SUTURE FIBERWR#2 38 REV NDL BL (SUTURE) ×3 IMPLANT
SYR CONTROL 10ML LL (SYRINGE) ×2 IMPLANT
TOWEL OR 17X24 6PK STRL BLUE (TOWEL DISPOSABLE) ×2 IMPLANT
TOWEL OR 17X26 10 PK STRL BLUE (TOWEL DISPOSABLE) ×2 IMPLANT
TOWER CARTRIDGE SMART MIX (DISPOSABLE) IMPLANT
TRAY FOLEY CATH 16FRSI W/METER (SET/KITS/TRAYS/PACK) ×2 IMPLANT
WATER STERILE IRR 1000ML POUR (IV SOLUTION) ×8 IMPLANT

## 2013-08-13 NOTE — Anesthesia Preprocedure Evaluation (Addendum)
Anesthesia Evaluation  Patient identified by MRN, date of birth, ID band Patient awake    Reviewed: Allergy & Precautions, H&P , NPO status , Patient's Chart, lab work & pertinent test results, reviewed documented beta blocker date and time   History of Anesthesia Complications Negative for: history of anesthetic complications  Airway Mallampati: I TM Distance: >3 FB Neck ROM: Full    Dental  (+) Dental Advisory Given and Teeth Intact   Pulmonary COPDCurrent Smoker,  breath sounds clear to auscultation  Pulmonary exam normal       Cardiovascular hypertension, Pt. on medications and Pt. on home beta blockers - angina- Valvular Problems/MurmursRhythm:Regular Rate:Normal     Neuro/Psych negative neurological ROS     GI/Hepatic negative GI ROS, Neg liver ROS,   Endo/Other  diabetes (glu 137)  Renal/GU negative Renal ROS     Musculoskeletal   Abdominal   Peds  Hematology   Anesthesia Other Findings   Reproductive/Obstetrics                        Anesthesia Physical Anesthesia Plan  ASA: III  Anesthesia Plan: General   Post-op Pain Management:    Induction: Intravenous  Airway Management Planned: Oral ETT  Additional Equipment:   Intra-op Plan:   Post-operative Plan: Extubation in OR  Informed Consent: I have reviewed the patients History and Physical, chart, labs and discussed the procedure including the risks, benefits and alternatives for the proposed anesthesia with the patient or authorized representative who has indicated his/her understanding and acceptance.   Dental advisory given  Plan Discussed with: Anesthesiologist, Surgeon and CRNA  Anesthesia Plan Comments: (Plan routine monitors, GETA)       Anesthesia Quick Evaluation

## 2013-08-13 NOTE — Anesthesia Procedure Notes (Signed)
Procedure Name: Intubation Date/Time: 08/13/2013 1:44 PM Performed by: Teryl Lucy P Pre-anesthesia Checklist: Patient identified, Emergency Drugs available, Suction available and Patient being monitored Patient Re-evaluated:Patient Re-evaluated prior to inductionOxygen Delivery Method: Circle system utilized Preoxygenation: Pre-oxygenation with 100% oxygen Intubation Type: IV induction Ventilation: Mask ventilation without difficulty Laryngoscope Size: Miller and 2 Grade View: Grade I Tube type: Oral Tube size: 7.0 mm Number of attempts: 1 Airway Equipment and Method: Stylet Placement Confirmation: ETT inserted through vocal cords under direct vision,  positive ETCO2 and breath sounds checked- equal and bilateral Secured at: 22 cm Tube secured with: Tape Dental Injury: Teeth and Oropharynx as per pre-operative assessment

## 2013-08-13 NOTE — Anesthesia Postprocedure Evaluation (Signed)
  Anesthesia Post-op Note  Patient: CANDELA KRUL  Procedure(s) Performed: Procedure(s): TOTAL HIP ARTHROPLASTY (Left)  Patient Location: PACU  Anesthesia Type:General  Level of Consciousness: awake, alert , oriented and patient cooperative  Airway and Oxygen Therapy: Patient Spontanous Breathing and Patient connected to nasal cannula oxygen  Post-op Pain: mild  Post-op Assessment: Post-op Vital signs reviewed, Patient's Cardiovascular Status Stable, Respiratory Function Stable, Patent Airway, No signs of Nausea or vomiting and Pain level controlled  Post-op Vital Signs: Reviewed and stable  Complications: No apparent anesthesia complications

## 2013-08-13 NOTE — Preoperative (Signed)
Beta Blockers   Reason not to administer Beta Blockers:Not Applicable, patient took Carvedilol 08/13/13.

## 2013-08-13 NOTE — Transfer of Care (Signed)
Immediate Anesthesia Transfer of Care Note  Patient: Melissa Waters  Procedure(s) Performed: Procedure(s): TOTAL HIP ARTHROPLASTY (Left)  Patient Location: PACU  Anesthesia Type:General  Level of Consciousness: awake and alert   Airway & Oxygen Therapy: Patient Spontanous Breathing and Patient connected to nasal cannula oxygen  Post-op Assessment: Report given to PACU RN, Post -op Vital signs reviewed and stable and Patient moving all extremities X 4  Post vital signs: Reviewed and stable  Complications: No apparent anesthesia complications

## 2013-08-13 NOTE — Op Note (Signed)
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 female who has a diagnosis of Osteoarthritis of left hip and elected for surgical management after failing conservative treatment.  The risks benefits and alternatives were discussed with the patient including but not limited to the risks of nonoperative treatment, versus surgical intervention including infection, bleeding, nerve injury, periprosthetic fracture, the need for revision surgery, dislocation, leg length discrepancy, blood clots, cardiopulmonary complications, morbidity, mortality, among others, and they were willing to proceed.    She had a right total hip replacement done by myself about 4 years ago, and was complaining of a snapping sound that occurred when she twisted and moved on her right hip. This was intermittent, and didn't begin until about 2 or 3 years after the surgery. She denies significant pain with this, but there was concern about the possibility of subluxation of the prosthesis, and the possibility of instability.   OPERATIVE REPORT     SURGEON:  Teryl Lucy, MD    ASSISTANT:  Janace Litten, OPA-C  (Present throughout the entire procedure,  necessary for completion of procedure in a timely manner, assisting with retraction, instrumentation, and closure)     ANESTHESIA:  General    COMPLICATIONS:  None.     COMPONENTS:  [employer] fit femur size 4 with a 32 mm +5 head ball and a gription acetabular shell size 50 with a 10 degree neutral polyethylene liner    PROCEDURE IN DETAIL:   The  patient was met in the holding area and  identified.  The appropriate hip was identified and marked at the operative site.  The patient was then transported to the OR  and  placed under general anesthesia.  I examined her left hip, which had well-healed surgical wounds, and remarkably excellent motion. She had at least 0-120 of flexion, although I did not push it too far. She internally and externally rotated to at least 30. I was able to palpate a click with internal rotation as her iliotibial band snapped over the greater trochanter. This is reproducible. She did not actually dislocate, and I was able to confirm that her leg lengths remained equal during the course of the examination. The hip did not feel unstable at all.  At that point, the patient was  placed in the lateral decubitus position with the operative side up and  secured to the operating room table and all bony prominences padded.     The operative lower extremity was prepped from the iliac crest to the distal leg.  Sterile draping was performed.  Time out was performed prior to incision.      A routine posterolateral approach was utilized via sharp dissection  carried down to the subcutaneous tissue.  Gross bleeders were Bovie coagulated.  The iliotibial band was identified and incised along the length of the skin incision.  Self-retaining retractors were  inserted.  With the hip internally rotated, the short external rotators  were identified. The piriformis and capsule was tagged with FiberWire, and the hip capsule released in a T-type fashion.  The femoral neck was exposed, and I  resected the femoral neck using the appropriate jig. This was performed at approximately three quarters thumb's breadth above the lesser trochanter. She had a fairly short neck. The anatomy of the femoral head was extremely distorted.    I then exposed the deep acetabulum, cleared out any tissue including the ligamentum teres.  A wing retractor was placed.   After adequate visualization, I excised the labrum, and then sequentially reamed.  I medialized initially, going down to the medial wall, and there was a fairly substantial rim of osteophytes circumferentially around the acetabulum. I placed the trial acetabulum, which seated nicely, and then impacted the real cup into place.  Appropriate version and inclination was confirmed clinically matching her bony anatomy, and also with the use of the jig.  A trial polyethylene liner was placed and the wing retractor removed.    I then prepared the proximal femur using the cookie-cutter, the lateralizing reamer, and then sequentially reamed and broached.  A trial broach, neck, and head was utilized, and I reduced the hip and it was found to have excellent stability with functional range of motion. I debated using the 5 neck, as this had excellent stability, and a smooth arc of motion, although there was some slight shuck, and I was concerned that she might be slightly short compared to the contralateral side, versus a 9, which felt more equal in leg length, but was fairly tight. I elected to go with a 9, in order to match her other side. The trial components were then removed, and the real polyethylene liner was placed.  I then impacted the real femoral prosthesis into place into the appropriate version, matching her normal anatomy, and I impacted the real +9 head ball into place. The hip was then reduced and taken through functional range of motion and found to have excellent stability. I was concerned however because the trochanter was slightly prominent, and the external rotators did not have enough length to get back to the bone, and I was concerned that I may have over lengthened her, and this could potentially contribute to a external snapping syndrome on the left side and so I actually removed the +9 head ball and converted it to a +5 head ball, which had a more natural feel, and did not over lengthened the  leg.  Leg lengths were restored.  I then used a 2 mm drill bits to pass the FiberWire suture from the capsule and piriformis through the greater trochanter, and secured this. Excellent posterior capsular repair was achieved. I also closed the T in the capsule.  I then irrigated the hip copiously again with pulse lavage, and repaired the fascia with Vicryl, followed by Vicryl for the subcutaneous tissue, Monocryl for the skin, Steri-Strips and sterile gauze. The wounds were injected. The patient was then awakened and returned to PACU in stable and satisfactory condition. There were no complications.  Teryl Lucy, MD Orthopedic Surgeon [phone]   08/13/2013 3:46 PM

## 2013-08-13 NOTE — Plan of Care (Signed)
Problem: Consults Goal: Diagnosis- Total Joint Replacement Primary Total Hip-left        

## 2013-08-13 NOTE — H&P (Signed)
PREOPERATIVE H&P  Chief Complaint: DJD LEFT HIP  HPI: Melissa Waters is a 63 y.o. female who presents for preoperative history and physical with a diagnosis of DJD LEFT HIP. Symptoms are rated as moderate to severe, and have been worsening.  This is significantly impairing activities of daily living.  She has elected for surgical management. She has had a previous right total hip placement, and did well, although she had some clunking around the right hip that was somewhat episodic, workup with a CAT scan that demonstrated some heterotopic ossification around the greater trochanter, but well positioned components. Her right hip has decreased in the frequency of clunking, and is nonpainful. Her left hip however has severe pain, located around the groin, and has failed NSAIDs, use of a cane, etc.  Past Medical History  Diagnosis Date  . Hypertension   . Heart murmur     as a baby  . COPD (chronic obstructive pulmonary disease)   . Osteoarthritis     right hip  . Diabetes mellitus without complication     no meds   . Nephritis     Hx: of 1958  . Diverticulosis     Hx: of   Past Surgical History  Procedure Laterality Date  . Total hip arthroplasty Right     05/27/2009  . Colonoscopy w/ polypectomy  2006   History   Social History  . Marital Status: Married    Spouse Name: N/A    Number of Children: N/A  . Years of Education: N/A   Social History Main Topics  . Smoking status: Current Every Day Smoker -- 0.20 packs/day for 44 years    Types: Cigarettes  . Smokeless tobacco: Never Used  . Alcohol Use: Yes     Comment: social  . Drug Use: No  . Sexual Activity: None   Other Topics Concern  . None   Social History Narrative  . None   Family History  Problem Relation Age of Onset  . Heart Problems Father 106    CABG  . Hypertension Mother   . Heart attack  60    fatal MI   Allergies  Allergen Reactions  . Amoxil [Amoxicillin] Diarrhea and Nausea And Vomiting    Prior to Admission medications   Medication Sig Start Date End Date Taking? Authorizing Provider  Calcium Carbonate-Vitamin D (CALCIUM 600 + D PO) Take 1 tablet by mouth daily.   Yes Historical Provider, MD  Carboxymethylcellul-Glycerin (CLEAR EYES FOR DRY EYES OP) Place 1 drop into both eyes 2 (two) times daily.   Yes Historical Provider, MD  carvedilol (COREG) 3.125 MG tablet Take 1 tablet (3.125 mg total) by mouth 2 (two) times daily. 06/11/13  Yes Lars Masson, MD  Cinnamon 500 MG capsule Take 1,000 mg by mouth daily.   Yes Historical Provider, MD  clindamycin (CLEOCIN) 300 MG capsule Take 600 mg by mouth once as needed (prior to dental work).   Yes Historical Provider, MD  ibuprofen (ADVIL,MOTRIN) 200 MG tablet Take 400 mg by mouth 2 (two) times daily as needed for pain.   Yes Historical Provider, MD  MILK THISTLE PO Take 350 mg by mouth daily.   Yes Historical Provider, MD  quinapril (ACCUPRIL) 20 MG tablet Take 20 mg by mouth daily before breakfast.    Yes Historical Provider, MD  Skin Protectants, Misc. (AMERIGEL BARRIER EX) Apply 1 application topically daily as needed (applies to dry skin on feet).   Yes Historical Provider, MD  Positive ROS: All other systems have been reviewed and were otherwise negative with the exception of those mentioned in the HPI and as above.  Physical Exam: General: Alert, no acute distress Cardiovascular: No pedal edema Respiratory: No cyanosis, no use of accessory musculature GI: No organomegaly, abdomen is soft and non-tender Skin: No lesions in the area of chief complaint Neurologic: Sensation intact distally Psychiatric: Patient is competent for consent with normal mood and affect Lymphatic: No axillary or cervical lymphadenopathy  MUSCULOSKELETAL: left hip with range of motion 0-90 deg, 10 deg  IR at most, EHL, FHL firing.  Assessment: DJD LEFT HIP  Plan: Plan for Procedure(s): TOTAL HIP ARTHROPLASTY  The risks benefits and  alternatives were discussed with the patient including but not limited to the risks of nonoperative treatment, versus surgical intervention including infection, bleeding, nerve injury, periprosthetic fracture, the need for revision surgery, dislocation, leg length discrepancy, blood clots, cardiopulmonary complications, morbidity, mortality, among others, and they were willing to proceed.     Eulas Post, MD Cell [phone]   08/13/2013 1:25 PM

## 2013-08-14 LAB — BASIC METABOLIC PANEL
BUN: 6 mg/dL (ref 6–23)
CO2: 24 mEq/L (ref 19–32)
Calcium: 8.8 mg/dL (ref 8.4–10.5)
Creatinine, Ser: 0.34 mg/dL — ABNORMAL LOW (ref 0.50–1.10)
GFR calc Af Amer: >90 mL/min (ref >90)
GFR calc non Af Amer: >90 mL/min (ref >90)
Glucose, Bld: 122 mg/dL — ABNORMAL HIGH (ref 70–99)
Potassium: 4 mEq/L (ref 3.5–5.1)
Sodium: 133 mEq/L — ABNORMAL LOW (ref 135–145)

## 2013-08-14 LAB — CBC
Hemoglobin: 11.9 g/dL — ABNORMAL LOW (ref 12.0–15.0)
MCH: 30.7 pg (ref 26.0–34.0)
MCV: 90.4 fL (ref 78.0–100.0)
Platelets: 213 10*3/uL (ref 150–400)
RBC: 3.87 MIL/uL (ref 3.87–5.11)
RDW: 13 % (ref 11.5–15.5)

## 2013-08-14 LAB — GLUCOSE, CAPILLARY
Glucose-Capillary: 121 mg/dL — ABNORMAL HIGH (ref 70–99)
Glucose-Capillary: 192 mg/dL — ABNORMAL HIGH (ref 70–99)
Glucose-Capillary: 191 mg/dL — ABNORMAL HIGH (ref 70–99)

## 2013-08-14 MED ORDER — PNEUMOCOCCAL VAC POLYVALENT 25 MCG/0.5ML IJ INJ
0.5000 mL | INJECTION | INTRAMUSCULAR | Status: DC
  Filled 2013-08-14: qty 0.5

## 2013-08-14 NOTE — Progress Notes (Signed)
Physical Therapy Treatment Patient Details Name: Melissa Waters MRN: [id number] DOB: 1951/07/22 Today's Date: 08/14/2013 Time: 4098-1191 PT Time Calculation (min): 29 min  PT Assessment / Plan / Recommendation  History of Present Illness Pt is a 62 y/o female admitted s/p L THA on 08/13/13.   PT Comments   Pt progressing well towards physical therapy goals. She was able to improve ambulation distance with no reports of fatigue or SOB. Needs to practice steps prior to d/c.  Follow Up Recommendations  Home health PT     Does the patient have the potential to tolerate intense rehabilitation     Barriers to Discharge        Equipment Recommendations  None recommended by PT    Recommendations for Other Services    Frequency 7X/week   Progress towards PT Goals Progress towards PT goals: Progressing toward goals  Plan Current plan remains appropriate    Precautions / Restrictions Precautions Precautions: Fall;Posterior Hip Required Braces or Orthoses: Knee Immobilizer - Left Knee Immobilizer - Left: On when out of bed or walking Restrictions Weight Bearing Restrictions: Yes LLE Weight Bearing: Weight bearing as tolerated   Pertinent Vitals/Pain At beginning of session, pt reports that her pain has been well controlled.    Mobility  Bed Mobility Bed Mobility: Sit to Supine;Scooting to HOB Supine to Sit: 5: Supervision Sit to Supine: 4: Min assist Scooting to [facility]: 5: Supervision Details for Bed Mobility Assistance: VC's for sequencing and technique to elevate LE's into bed. Pt able to scoot up in bed well while maintaining hip precautions. Transfers Transfers: Sit to Stand;Stand to Sit Sit to Stand: 4: Min guard;From chair/3-in-1;With upper extremity assist Stand to Sit: 4: Min guard;To chair/3-in-1;With upper extremity assist Details for Transfer Assistance: Pt demonstrated proper hand placement and safety awareness with transfers. Ambulation/Gait Ambulation/Gait  Assistance: 4: Min guard Ambulation Distance (Feet): 125 Feet Assistive device: Rolling walker Ambulation/Gait Assistance Details: VC's for increased heel strike, step length and encouraged step-through gait pattern. Pt able to make corrective changes with cues. Gait Pattern: Step-to pattern;Step-through pattern;Decreased stride length Gait velocity: Decreased Stairs: No    Exercises Total Joint Exercises Ankle Circles/Pumps: 10 reps Quad Sets: 10 reps Heel Slides: 5 reps Long Arc Quad: 15 reps   PT Diagnosis:    PT Problem List:   PT Treatment Interventions:     PT Goals (current goals can now be found in the care plan section) Acute Rehab PT Goals Patient Stated Goal: To return home with husband PT Goal Formulation: With patient Time For Goal Achievement: 08/21/13 Potential to Achieve Goals: Good  Visit Information  Last PT Received On: 08/14/13 Assistance Needed: +1 History of Present Illness: Pt is a 62 y/o female admitted s/p L THA on 08/13/13.    Subjective Data  Subjective: "I am feeling better this afternoon." Patient Stated Goal: To return home with husband   Cognition  Cognition Arousal/Alertness: Awake/alert Behavior During Therapy: WFL for tasks assessed/performed Overall Cognitive Status: Within Functional Limits for tasks assessed    Balance  Static Sitting Balance Static Sitting - Level of Assistance: 6: Modified independent (Device/Increase time) Static Standing Balance Static Standing - Level of Assistance: 5: Stand by assistance  End of Session PT - End of Session Equipment Utilized During Treatment: Gait belt Activity Tolerance: Patient tolerated treatment well Patient left: in bed;with call bell/phone within reach Nurse Communication: Mobility status   GP     Melissa Waters 08/14/2013, 4:26 PM  Melissa Waters, PT, DPT  [phone]    

## 2013-08-14 NOTE — Progress Notes (Signed)
Patient ID: Melissa Waters, female   DOB: 10-03-1951, 62 y.o.   MRN: [id number]     Subjective:  Patient reports pain as mild.  Patient alert and aware of time and place.  Waiting on PT to get up  Objective:   VITALS:   Filed Vitals:   08/13/13 1800 08/13/13 2042 08/14/13 0143 08/14/13 0530  BP: 117/55 130/73 136/79 127/72  Pulse: 61 80 84 87  Temp: 98.8 F (37.1 C) 98.5 F (36.9 C) 99.8 F (37.7 C) 100 F (37.8 C)  TempSrc:      Resp: 16 18 16 16   SpO2: 98% 100% 97% 95%    ABD soft Sensation intact distally Dorsiflexion/Plantar flexion intact Incision: dressing C/D/I and scant drainage   Lab Results  Component Value Date   WBC 9.8 08/14/2013   HGB 11.9* 08/14/2013   HCT 35.0* 08/14/2013   MCV 90.4 08/14/2013   PLT 213 08/14/2013     Assessment/Plan: 1 Day Post-Op   Principal Problem:   Osteoarthritis of left hip   Advance diet Up with therapy WBAT Dry Dressing PRN Plan for DC Wed or Thurs   Haskel Khan 08/14/2013, 7:20 AM   Teryl Lucy, MD Cell [phone]

## 2013-08-14 NOTE — Evaluation (Signed)
Physical Therapy Evaluation Patient Details Name: Melissa Waters MRN: [id number] DOB: 02/04/1951 Today's Date: 08/14/2013 Time: 0940-1003 PT Time Calculation (min): 23 min  PT Assessment / Plan / Recommendation History of Present Illness  Pt is a 62 y/o female admitted s/p L THA on 08/13/13.  Clinical Impression  This patient presents with acute pain and decreased functional independence following the above mentioned procedure. At the time of PT eval, pt required min guard and occasional min assist to perform functional mobility. Plan is to d/c home with husband to help. This patient is appropriate for skilled PT interventions to address functional limitations, improve safety and independence with functional mobility, and return to PLOF.    PT Assessment  Patient needs continued PT services    Follow Up Recommendations  Home health PT    Does the patient have the potential to tolerate intense rehabilitation      Barriers to Discharge        Equipment Recommendations  None recommended by PT    Recommendations for Other Services     Frequency 7X/week    Precautions / Restrictions Precautions Precautions: Fall;Knee Required Braces or Orthoses: Knee Immobilizer - Left Knee Immobilizer - Left: On when out of bed or walking Restrictions Weight Bearing Restrictions: Yes LLE Weight Bearing: Weight bearing as tolerated   Pertinent Vitals/Pain 6/10 at rest      Mobility  Bed Mobility Bed Mobility: Supine to Sit;Sitting - Scoot to Edge of Bed Supine to Sit: 4: Min assist;HOB flat;With rails Sitting - Scoot to Edge of Bed: 4: Min guard Details for Bed Mobility Assistance: VC's for sequencing and technique. Some assist for LLE support as pt scooted to EOB. Transfers Transfers: Sit to Stand;Stand to Sit Sit to Stand: 4: Min guard;From bed;With upper extremity assist Stand to Sit: 4: Min guard;To chair/3-in-1;With upper extremity assist Details for Transfer Assistance: VC's for  hand placement on seated surface.  Ambulation/Gait Ambulation/Gait Assistance: 4: Min guard Ambulation Distance (Feet): 30 Feet Assistive device: Rolling walker Ambulation/Gait Assistance Details: VC's for sequencing and safety awareness with the walker, as well as for increased heel strike on LLE. Pt was reminded of WBAT status, and cued for increased step length on R to encourage step-through gait pattern. Gait Pattern: Step-to pattern;Decreased stride length;Trunk flexed Gait velocity: Decreased Stairs: No    Exercises Total Joint Exercises Ankle Circles/Pumps: 10 reps Quad Sets: 10 reps   PT Diagnosis: Difficulty walking;Acute pain  PT Problem List: Decreased strength;Decreased range of motion;Decreased activity tolerance;Decreased balance;Decreased mobility;Decreased knowledge of use of DME;Decreased safety awareness;Decreased knowledge of precautions;Pain PT Treatment Interventions: DME instruction;Gait training;Stair training;Functional mobility training;Therapeutic activities;Therapeutic exercise;Neuromuscular re-education;Patient/family education     PT Goals(Current goals can be found in the care plan section) Acute Rehab PT Goals Patient Stated Goal: To return home with husband PT Goal Formulation: With patient Time For Goal Achievement: 08/21/13 Potential to Achieve Goals: Good  Visit Information  Last PT Received On: 08/14/13 Assistance Needed: +1 History of Present Illness: Pt is a 62 y/o female admitted s/p L THA on 08/13/13.       Prior Functioning  Home Living Family/patient expects to be discharged to:: Private residence Living Arrangements: Spouse/significant other Available Help at Discharge: Family;Friend(s);Available 24 hours/day Type of Home: House Home Access: Stairs to enter [employer] of Steps: 2 Entrance Stairs-Rails: None Home Layout: One level Home Equipment: Walker - 2 wheels;Bedside commode;Tub bench;Shower seat;Cane - single  point Prior Function Level of Independence: Independent Communication Communication: No difficulties  Dominant Hand: Right    Cognition  Cognition Arousal/Alertness: Awake/alert Behavior During Therapy: WFL for tasks assessed/performed Overall Cognitive Status: Within Functional Limits for tasks assessed    Extremity/Trunk Assessment Upper Extremity Assessment Upper Extremity Assessment: Defer to OT evaluation Lower Extremity Assessment Lower Extremity Assessment: LLE deficits/detail LLE Deficits / Details: Decreased strength and AROM consistent with THA LLE: Unable to fully assess due to pain Cervical / Trunk Assessment Cervical / Trunk Assessment: Normal   Balance Balance Balance Assessed: Yes Static Sitting Balance Static Sitting - Balance Support: Feet supported;Bilateral upper extremity supported Static Sitting - Level of Assistance: 5: Stand by assistance Static Sitting - Comment/# of Minutes: 3 minutes at EOB Static Standing Balance Static Standing - Balance Support: Bilateral upper extremity supported Static Standing - Level of Assistance: 5: Stand by assistance (min guard) Static Standing - Comment/# of Minutes: 30 seconds prior to initiating ambulation  End of Session PT - End of Session Equipment Utilized During Treatment: Gait belt;Left knee immobilizer Activity Tolerance: Patient tolerated treatment well Patient left: in chair;with call bell/phone within reach Nurse Communication: Mobility status  GP     [name], [name] 08/14/2013, 12:38 PM  Ruthann Cancer, PT, DPT [phone]

## 2013-08-14 NOTE — Progress Notes (Signed)
08/14/13 Set up with HHPT, HHOT and HHRN with [facility] by MD office.Spoke with patient and her husband, no change in d/c plan. Patient states that she has a rolling walker and a 3N1. No other d/c needs identified. Jacquelynn Cree RN, BSN, CCM

## 2013-08-14 NOTE — Evaluation (Signed)
Occupational Therapy Evaluation Patient Details Name: Melissa Waters MRN: [id number] DOB: 25-May-1951 Today's Date: 08/14/2013 Time: 0865-7846 OT Time Calculation (min): 28 min  OT Assessment / Plan / Recommendation History of present illness Pt is a 62 y/o female admitted s/p L THA on 08/13/13.   Clinical Impression   Pt would benefit from continued skilled Melissa Waters continue to focus on dynamic standing balance, activity tolerance and functional mobility with ADL tasks while maintaining hip precautions.     OT Assessment  Patient needs continued OT Services    Follow Up Recommendations  No OT follow up    Barriers to Discharge      Equipment Recommendations       Recommendations for Other Services    Frequency  Min 2X/week    Precautions / Restrictions Precautions Precautions: Posterior Hip Required Braces or Orthoses: Knee Immobilizer - Left Knee Immobilizer - Left: On when out of bed or walking Restrictions LLE Weight Bearing: Weight bearing as tolerated   Pertinent Vitals/Pain No c/o pain; applied ice after session     ADL  Lower Body Dressing: Simulated;Minimal assistance Toilet Transfer: Simulated;Minimal assistance Toilet Transfer Method: Sit to stand Transfers/Ambulation Related to ADLs: Pt in bed finishing with PT when arrived. Performed bed mobility to come to EOB off left side of bed, sit to stand and reviewed hip precautions and AE for LB B/D tasks. Pt with h/o right THR and with knowledge of AE and DME. Pt recalled 2/3 precautions.     OT Diagnosis: Generalized weakness  OT Problem List: Decreased strength;Decreased activity tolerance;Decreased safety awareness;Cardiopulmonary status limiting activity;Pain OT Treatment Interventions: Self-care/ADL training;Therapeutic exercise;Patient/family education;Modalities;Neuromuscular education;Therapeutic activities;Balance training   OT Goals(Current goals can be found in the care plan section) Acute Rehab OT  Goals Patient Stated Goal: To return home with husband OT Goal Formulation: With patient/family Time For Goal Achievement: 08/17/13 Potential to Achieve Goals: Good ADL Goals Pt Will Perform Lower Body Bathing: with modified independence;with adaptive equipment Pt Will Perform Lower Body Dressing: with modified independence;with adaptive equipment;sit to/from stand Pt Will Transfer to Toilet: with modified independence;ambulating;stand pivot transfer Pt Will Perform Toileting - Clothing Manipulation and hygiene: with modified independence;sit to/from stand Pt Will Perform Tub/Shower Transfer: with set-up;ambulating;tub bench  Visit Information  Last OT Received On: 08/14/13 Assistance Needed: +1 History of Present Illness: Pt is a 62 y/o female admitted s/p L THA on 08/13/13.       Prior Functioning     Home Living Family/patient expects to be discharged to:: Private residence Living Arrangements: Spouse/significant other Available Help at Discharge: Family;Friend(s);Available 24 hours/day Type of Home: House Home Access: Stairs to enter [employer] of Steps: 2 Entrance Stairs-Rails: None Home Layout: One level Home Equipment: Walker - 2 wheels;Bedside commode;Tub bench;Shower seat;Cane - single point         Vision/Perception Vision - History Baseline Vision: No visual deficits Patient Visual Report: No change from baseline Vision - Assessment Eye Alignment: Within Functional Limits Perception Perception: Within Functional Limits Praxis Praxis: Intact   Cognition  Cognition Arousal/Alertness: Awake/alert Behavior During Therapy: WFL for tasks assessed/performed Overall Cognitive Status: Within Functional Limits for tasks assessed    Extremity/Trunk Assessment Upper Extremity Assessment Upper Extremity Assessment: Overall WFL for tasks assessed Lower Extremity Assessment Lower Extremity Assessment: LLE deficits/detail LLE Deficits / Details:  Decreased strength and AROM consistent with THA     Mobility Bed Mobility Supine to Sit: 5: Supervision Sit to Supine: 5: Supervision Details for Bed Mobility Assistance: VC's  for sequencing and technique.  Transfers Transfers: Sit to Stand;Stand to Sit Sit to Stand: 5: Supervision;4: Min guard Stand to Sit: 5: Supervision     Exercise     Balance Static Sitting Balance Static Sitting - Level of Assistance: 6: Modified independent (Device/Increase time) Static Standing Balance Static Standing - Level of Assistance: 5: Stand by assistance   End of Session OT - End of Session Activity Tolerance: Patient tolerated treatment well Patient left: in bed;with family/visitor present  GO     Melissa Waters 08/14/2013, 3:12 PM

## 2013-08-15 ENCOUNTER — Encounter (HOSPITAL_COMMUNITY): Payer: Self-pay | Admitting: Orthopedic Surgery

## 2013-08-15 LAB — CBC
HCT: 31.5 % — ABNORMAL LOW (ref 36.0–46.0)
Hemoglobin: 10.8 g/dL — ABNORMAL LOW (ref 12.0–15.0)
MCH: 30.8 pg (ref 26.0–34.0)
MCHC: 34.3 g/dL (ref 30.0–36.0)
RBC: 3.51 MIL/uL — ABNORMAL LOW (ref 3.87–5.11)
RDW: 13 % (ref 11.5–15.5)
WBC: 10.9 10*3/uL — ABNORMAL HIGH (ref 4.0–10.5)

## 2013-08-15 LAB — BASIC METABOLIC PANEL
BUN: 6 mg/dL (ref 6–23)
Calcium: 8.6 mg/dL (ref 8.4–10.5)
GFR calc Af Amer: >90 mL/min (ref >90)
GFR calc non Af Amer: >90 mL/min (ref >90)
Glucose, Bld: 153 mg/dL — ABNORMAL HIGH (ref 70–99)
Potassium: 3.9 mEq/L (ref 3.5–5.1)

## 2013-08-15 LAB — GLUCOSE, CAPILLARY

## 2013-08-15 MED ORDER — ASPIRIN EC 325 MG PO TBEC: 325.0000 mg | DELAYED_RELEASE_TABLET | Freq: Two times a day (BID) | ORAL | Status: DC

## 2013-08-15 NOTE — Progress Notes (Signed)
Physical Therapy Treatment Patient Details Name: Melissa Waters MRN: [id number] DOB: October 16, 1950 Today's Date: 08/15/2013 Time: 1275-1700 PT Time Calculation (min): 33 min  PT Assessment / Plan / Recommendation  History of Present Illness Pt is a 62 y/o female admitted s/p L THA on 08/13/13.   PT Comments   Pt is progressing well towards physical therapy goals. Pt was able to improve in gait distance as well as negotiate 2 steps with RW for safe entry into home. HEP was given and pt demonstrates understanding with technique. From a mobility standpoint pt is safe to d/c home with HHPT to follow.  Follow Up Recommendations  Home health PT     Does the patient have the potential to tolerate intense rehabilitation     Barriers to Discharge        Equipment Recommendations  None recommended by PT    Recommendations for Other Services    Frequency 7X/week   Progress towards PT Goals Progress towards PT goals: Progressing toward goals  Plan Current plan remains appropriate    Precautions / Restrictions Precautions Precautions: Fall;Posterior Hip Restrictions Weight Bearing Restrictions: Yes LLE Weight Bearing: Weight bearing as tolerated   Pertinent Vitals/Pain Pt reports no pain during session, and describes hip as "sore".    Mobility  Bed Mobility Bed Mobility: Supine to Sit;Sitting - Scoot to Edge of Bed Supine to Sit: 5: Supervision;HOB flat Sitting - Scoot to Edge of Bed: 5: Supervision Details for Bed Mobility Assistance: Supervision to transition to EOB. Pt did not need any assist and did not use bed rails for support. Transfers Transfers: Sit to Stand;Stand to Sit Sit to Stand: 5: Supervision;From bed;With upper extremity assist Stand to Sit: 5: Supervision;To chair/3-in-1;With upper extremity assist Details for Transfer Assistance: Proper hand placement and did not require physical assist to achieve full standing. Ambulation/Gait Ambulation/Gait Assistance: 5:  Supervision;4: Min guard Ambulation Distance (Feet): 150 Feet Assistive device: Rolling walker Ambulation/Gait Assistance Details: VC's for increased heel strike, and to keep heel down on R when stepping with L as pt was lifting herself up to advance her L leg forward. Gait Pattern: Step-through pattern;Step-to pattern;Decreased stride length Gait velocity: Decreased Stairs: Yes Stairs Assistance: 4: Min guard Stair Management Technique: Backwards;With walker Number of Stairs: 2 (ascending and descending) Wheelchair Mobility Wheelchair Mobility: No    Exercises Total Joint Exercises Ankle Circles/Pumps: 10 reps Quad Sets: 10 reps Towel Squeeze: 10 reps Short Arc Quad: 10 reps Heel Slides: 10 reps Hip ABduction/ADduction: 10 reps   PT Diagnosis:    PT Problem List:   PT Treatment Interventions:     PT Goals (current goals can now be found in the care plan section) Acute Rehab PT Goals Patient Stated Goal: To return home with husband PT Goal Formulation: With patient Time For Goal Achievement: 08/21/13 Potential to Achieve Goals: Good  Visit Information  Last PT Received On: 08/15/13 Assistance Needed: +1 History of Present Illness: Pt is a 62 y/o female admitted s/p L THA on 08/13/13.    Subjective Data  Subjective: "I need to talk to my husband about possibly going home today." Patient Stated Goal: To return home with husband   Cognition  Cognition Arousal/Alertness: Awake/alert Behavior During Therapy: WFL for tasks assessed/performed Overall Cognitive Status: Within Functional Limits for tasks assessed    Balance     End of Session PT - End of Session Equipment Utilized During Treatment: Gait belt Activity Tolerance: Patient tolerated treatment well Patient left: in chair;with call  bell/phone within reach Nurse Communication: Mobility status   GP     Rashaunda, Rahl 08/15/2013, 11:57 AM  Ruthann Cancer, PT, DPT [phone]

## 2013-08-15 NOTE — Progress Notes (Signed)
Physical Therapy Treatment Patient Details Name: TYIANA HILL MRN: [id number] DOB: 01-06-1951 Today's Date: 08/15/2013 Time: 1447-1510 PT Time Calculation (min): 23 min  PT Assessment / Plan / Recommendation  History of Present Illness Pt is a 62 y/o female admitted s/p L THA on 08/13/13.   PT Comments   Pt progressing with physical therapy and has met all current PT goals. Husband present for afternoon session and he and pt were educated on safe stair negotiation. HEP was briefly reviewed, as it was reviewed in detail during morning session. Pt reports she feels comfortable returning home at this point and does not have any further questions. At this time, pt is d/c from acute skilled PT services, with HHPT to follow upon pt return home.  Follow Up Recommendations  Home health PT     Does the patient have the potential to tolerate intense rehabilitation     Barriers to Discharge        Equipment Recommendations  None recommended by PT    Recommendations for Other Services    Frequency 7X/week   Progress towards PT Goals Progress towards PT goals: Goals met/education completed, patient discharged from PT  Plan Current plan remains appropriate    Precautions / Restrictions Precautions Precautions: Fall;Posterior Hip Restrictions Weight Bearing Restrictions: Yes LLE Weight Bearing: Weight bearing as tolerated   Pertinent Vitals/Pain Pt reports no pain throughout session.     Mobility  Bed Mobility Bed Mobility: Not assessed Supine to Sit: 5: Supervision;HOB flat Sitting - Scoot to Edge of Bed: 5: Supervision Details for Bed Mobility Assistance: Pt received in gym to perform stair training. Transfers Transfers: Sit to Stand;Stand to Sit Sit to Stand: 5: Supervision;From chair/3-in-1;With upper extremity assist Stand to Sit: 5: Supervision;To chair/3-in-1;With upper extremity assist Details for Transfer Assistance: No physical assist required to come to full stand. Pt  demonstrated proper hand placement and safety awareness. Ambulation/Gait Ambulation/Gait Assistance: 5: Supervision Ambulation Distance (Feet): 250 Feet Assistive device: Rolling walker Ambulation/Gait Assistance Details: VC's for increased heel strike, increased toe off, and increased step/stride length to encourage step-through gait pattern.  Gait Pattern: Step-to pattern;Step-through pattern;Decreased stride length Gait velocity: Decreased Stairs: Yes Stairs Assistance: 5: Supervision Stair Management Technique: Backwards;With walker Number of Stairs: 4 (2 steps ascending and descending x 2) Wheelchair Mobility Wheelchair Mobility: No    Exercises Total Joint Exercises Ankle Circles/Pumps: 10 reps Quad Sets: 10 reps Towel Squeeze: 10 reps Short Arc Quad: 10 reps Heel Slides: 10 reps Hip ABduction/ADduction: 10 reps   PT Diagnosis:    PT Problem List:   PT Treatment Interventions:     PT Goals (current goals can now be found in the care plan section) Acute Rehab PT Goals Patient Stated Goal: To return home with husband PT Goal Formulation: With patient Time For Goal Achievement: 08/21/13 Potential to Achieve Goals: Good  Visit Information  Last PT Received On: 08/15/13 Assistance Needed: +1 History of Present Illness: Pt is a 62 y/o female admitted s/p L THA on 08/13/13.    Subjective Data  Subjective: "I really do feel good about going home today." Patient Stated Goal: To return home with husband   Cognition  Cognition Arousal/Alertness: Awake/alert Behavior During Therapy: WFL for tasks assessed/performed Overall Cognitive Status: Within Functional Limits for tasks assessed    Balance     End of Session PT - End of Session Equipment Utilized During Treatment: Gait belt Activity Tolerance: Patient tolerated treatment well Patient left: in chair;with call bell/phone within  reach;with family/visitor present Nurse Communication: Mobility status   GP      Retta, Pitcher 08/15/2013, 3:29 PM  Ruthann Cancer, PT, DPT [phone]

## 2013-08-15 NOTE — Progress Notes (Signed)
Patient ID: Melissa Waters, female   DOB: 1950-12-03, 62 y.o.   MRN: [id number]     Subjective:  Patient reports pain as mild.  Patient is better today.  Was able to rest last night    Objective:   VITALS:   Filed Vitals:   08/14/13 1608 08/14/13 2046 08/15/13 0442 08/15/13 0512  BP:  98/62  116/65  Pulse:  93  86  Temp: 99.1 F (37.3 C) 101.7 F (38.7 C) 98.8 F (37.1 C) 99.9 F (37.7 C)  TempSrc:   Oral   Resp:  18  18  SpO2:  95%  98%    ABD soft Sensation intact distally Dorsiflexion/Plantar flexion intact Incision: dressing C/D/I and scant drainage   Lab Results  Component Value Date   WBC 10.9* 08/15/2013   HGB 10.8* 08/15/2013   HCT 31.5* 08/15/2013   MCV 89.7 08/15/2013   PLT 198 08/15/2013     Assessment/Plan: 2 Days Post-Op   Principal Problem:   Osteoarthritis of left hip   Advance diet Up with therapy Plan for discharge tomorrow but may be able to go home today if passes PT and pain is controlled.  WBAT Dry dressing PRN    Haskel Khan 08/15/2013, 7:28 AM   Teryl Lucy, MD Cell [phone]

## 2013-08-15 NOTE — Progress Notes (Signed)
Occupational Therapy Treatment Patient Details Name: Melissa Waters MRN: [id number] DOB: May 13, 1951 Today's Date: 08/15/2013 Time: 2130-8657 OT Time Calculation (min): 56 min  OT Assessment / Plan / Recommendation  History of present illness Pt is a 62 y/o female admitted s/p L THA on 08/13/13.   OT comments  Patient is learning to recall THP use of AE and DME from former hip replacement ~4 years ago.  Patient's husband is independent providing vcs when needed to adhere to THP.  Patient has all AE & DME from former hip surgery.  Patient is ready for discharge home when medically stable.   Follow Up Recommendations  No OT follow up    Progress towards OT Goals Progress towards OT goals: Goals met/education completed, patient discharged from OT  Plan All goals met and education completed, patient discharged from OT services    Precautions / Restrictions Precautions Precautions: Fall;Posterior Hip Restrictions Weight Bearing Restrictions: Yes LLE Weight Bearing: Weight bearing as tolerated   Pertinent Vitals/Pain 3/10 left hip, repositioned, rest    ADL  Grooming: Performed;Supervision/safety Lower Body Bathing: Simulated;Supervision/safety Lower Body Dressing: Supervision/safety;Performed Toilet Transfer: Research scientist (life sciences) Method: Sit to stand;Stand pivot Acupuncturist: Raised toilet seat with arms (or 3-in-1 over toilet) Toileting - Clothing Manipulation and Hygiene: Performed;Supervision/safety Tub/Shower Transfer: Performed;Set up Tub/Shower Transfer Method: Stand pivot Tub/Shower Transfer Equipment: Counsellor Used: Long-handled shoe horn;Long-handled sponge;Reacher;Rolling walker;Sock aid Transfers/Ambulation Related to ADLs: vcs to adhere to hip precautions ADL Comments: vcs to adhere to hip precautions.  Patient's husband independent assisting patient when needed.  Patient will have 24/7 until no longer needed.    OT Goals(current goals can now be found in the care plan section) Acute Rehab OT Goals Patient Stated Goal: To return home with husband  Visit Information  Last OT Received On: 08/15/13 Assistance Needed: +1 History of Present Illness: Pt is a 62 y/o female admitted s/p L THA on 08/13/13.    Cognition  Cognition Arousal/Alertness: Awake/alert Behavior During Therapy: WFL for tasks assessed/performed Overall Cognitive Status: Within Functional Limits for tasks assessed    Mobility  Bed Mobility Bed Mobility: Supine to Sit;Sitting - Scoot to Edge of Bed Supine to Sit: 5: Supervision;HOB flat Sitting - Scoot to Edge of Bed: 5: Supervision Details for Bed Mobility Assistance: Supervision w/ vcs to transition to EOB. Pt did not need any assist and did not use bed rails for support. Transfers Sit to Stand: 5: Supervision;From bed;With upper extremity assist;From chair/3-in-1 Stand to Sit: 5: Supervision;To chair/3-in-1;With upper extremity assist Details for Transfer Assistance: Proper hand placement and did not require physical assist to achieve full standing. vcs to adhere to THP    Exercises  Total Joint Exercises Ankle Circles/Pumps: 10 reps Quad Sets: 10 reps Towel Squeeze: 10 reps Short Arc Quad: 10 reps Heel Slides: 10 reps Hip ABduction/ADduction: 10 reps   End of Session OT - End of Session Equipment Utilized During Treatment: Rolling walker Activity Tolerance: Patient tolerated treatment well Patient left: with family/visitor present (handed off to PT in therapy gym)  GO     Melissa Waters 08/15/2013, 3:00 PM

## 2013-08-15 NOTE — Discharge Summary (Signed)
Physician Discharge Summary  Patient ID: Melissa Waters MRN: [id number] DOB/AGE: 1951/02/24 62 y.o.  Admit date: 08/13/2013 Discharge date: 08/15/2013  Admission Diagnoses:  Osteoarthritis of left hip  Discharge Diagnoses:  Principal Problem:   Osteoarthritis of left hip   Past Medical History  Diagnosis Date  . Hypertension   . Heart murmur     as a baby  . COPD (chronic obstructive pulmonary disease)   . Osteoarthritis     right hip  . Diabetes mellitus without complication     no meds   . Nephritis     Hx: of 1958  . Diverticulosis     Hx: of  . Osteoarthritis of left hip 08/13/2013    Surgeries: Procedure(s): TOTAL HIP ARTHROPLASTY on 08/13/2013   Consultants (if any):    Discharged Condition: Improved  Hospital Course: Melissa Waters is an 62 y.o. female who was admitted 08/13/2013 with a diagnosis of Osteoarthritis of left hip and went to the operating room on 08/13/2013 and underwent the above named procedures.    She was given perioperative antibiotics:  Anti-infectives   Start     Dose/Rate Route Frequency Ordered Stop   08/14/13 0600  clindamycin (CLEOCIN) IVPB 900 mg  Status:  Discontinued     900 mg 100 mL/hr over 30 Minutes Intravenous On call to O.R. 08/13/13 1313 08/13/13 1720   08/13/13 1325  vancomycin (VANCOCIN) 1 GM/200ML IVPB    Comments:  Melissa Waters   : cabinet override      08/13/13 1325 08/14/13 0129   08/13/13 0600  vancomycin (VANCOCIN) IVPB 1000 mg/200 mL premix     1,000 mg 200 mL/hr over 60 Minutes Intravenous On call to O.R. 08/12/13 1410 08/13/13 1345    .  She was given sequential compression devices, early ambulation, and lovenox initially for DVT prophylaxis, switched to aspirin for discharge per patient's request..  She benefited maximally from the hospital stay and there were no complications.    Recent vital signs:  Filed Vitals:   08/15/13 0512  BP: 116/65  Pulse: 86  Temp: 99.9 F (37.7 C)  Resp: 18     Recent laboratory studies:  Lab Results  Component Value Date   HGB 10.8* 08/15/2013   HGB 11.9* 08/14/2013   HGB 11.5* 08/13/2013   Lab Results  Component Value Date   WBC 10.9* 08/15/2013   PLT 198 08/15/2013   Lab Results  Component Value Date   INR 0.96 08/06/2013   Lab Results  Component Value Date   NA 134* 08/15/2013   K 3.9 08/15/2013   CL 98 08/15/2013   CO2 25 08/15/2013   BUN 6 08/15/2013   CREATININE 0.31* 08/15/2013   GLUCOSE 153* 08/15/2013    Discharge Medications:     Medication List    STOP taking these medications       ibuprofen 200 MG tablet  Commonly known as:  ADVIL,MOTRIN      TAKE these medications       AMERIGEL BARRIER EX  Apply 1 application topically daily as needed (applies to dry skin on feet).     aspirin EC 325 MG tablet  Take 1 tablet (325 mg total) by mouth 2 (two) times daily.     bisacodyl 5 MG EC tablet  Commonly known as:  DULCOLAX  Take 1 tablet (5 mg total) by mouth daily as needed for moderate constipation.     CALCIUM 600 + D PO  Take 1 tablet by  mouth daily.     carvedilol 3.125 MG tablet  Commonly known as:  COREG  Take 1 tablet (3.125 mg total) by mouth 2 (two) times daily.     Cinnamon 500 MG capsule  Take 1,000 mg by mouth daily.     CLEAR EYES FOR DRY EYES OP  Place 1 drop into both eyes 2 (two) times daily.     clindamycin 300 MG capsule  Commonly known as:  CLEOCIN  Take 600 mg by mouth once as needed (prior to dental work).     MILK THISTLE PO  Take 350 mg by mouth daily.     oxyCODONE-acetaminophen 5-325 MG per tablet  Commonly known as:  ROXICET  Take 1-2 tablets by mouth every 6 (six) hours as needed for severe pain.     quinapril 20 MG tablet  Commonly known as:  ACCUPRIL  Take 20 mg by mouth daily before breakfast.     sennosides-docusate sodium 8.6-50 MG tablet  Commonly known as:  SENOKOT-S  Take 2 tablets by mouth daily.        Diagnostic Studies: Dg Pelvis  Portable  08/13/2013   CLINICAL DATA:  Postop total left hip.  EXAM: PORTABLE PELVIS 1-2 VIEWS  COMPARISON:  AP pelvis 05/27/2009.  FINDINGS: Patient status post total left hip replacement with good anatomic alignment on AP view. Subcutaneous air is noted over the left hip consistent with recent surgery. Foley catheter noted. Right total hip replacement appears stable from prior study.  IMPRESSION: Total left hip with good anatomic alignment on AP view .   Electronically Signed   By: Maisie Fus  Register   On: 08/13/2013 16:57   Dg Hip Portable 1 View Left  08/13/2013   CLINICAL DATA:  62year old female status post left total hip arthroplasty.  EXAM: PORTABLE LEFT HIP - 1 VIEW  COMPARISON:  1641 hr the same day.  FINDINGS: Portable cross-table lateral view at 1643 hrs. Left hip bipolar arthroplasty components appear intact and normally aligned.  IMPRESSION: Normal alignment of left hip arthroplasty components.   Electronically Signed   By: Augusto Gamble M.D.   On: 08/13/2013 16:54    Disposition:       Discharge Orders   Future Appointments Provider Department Dept Phone   08/28/2013 9:30 AM Lars Masson, MD [facility] [place] Office [phone]   Future Orders Complete By Expires   Call MD / Call 911  As directed    Comments:     If you experience chest pain or shortness of breath, CALL 911 and be transported to the hospital emergency room.  If you develope a fever above 101 F, pus (white drainage) or increased drainage or redness at the wound, or calf pain, call your surgeon's office.   Change dressing  As directed    Comments:     You may change your dressing in 3 days, then change the dressing daily with sterile 4 x 4 inch gauze dressing and paper tape.  You may clean the incision with alcohol prior to redressing   Constipation Prevention  As directed    Comments:     Drink plenty of fluids.  Prune juice may be helpful.  You may use a stool softener, such as Colace (over the counter)  100 mg twice a day.  Use MiraLax (over the counter) for constipation as needed.   Diet general  As directed    Discharge instructions  As directed    Comments:  Change dressing in 3 days and reapply fresh dressing, unless you have a splint (half cast).  If you have a splint/cast, just leave in place until your follow-up appointment.    Keep wounds dry for 3 weeks.  Leave steri-strips in place on skin.  Do not apply lotion or anything to the wound.   Follow the hip precautions as taught in Physical Therapy  As directed    Posterior total hip precautions  As directed    TED hose  As directed    Comments:     Use stockings (TED hose) for 2 weeks on both leg(s).  You may remove them at night for sleeping.   Weight bearing as tolerated  As directed    Questions:     Laterality:     Extremity:        Follow-up Information   Follow up with Eulas Post, MD. Schedule an appointment as soon as possible for a visit in 2 weeks.   Specialty:  Orthopedic Surgery   Contact information:   [address] [place] [address] [phone]        Signed: Eulas Post 08/15/2013, 8:26 AM

## 2013-08-28 ENCOUNTER — Ambulatory Visit (INDEPENDENT_AMBULATORY_CARE_PROVIDER_SITE_OTHER): Payer: 59 | Admitting: Cardiology

## 2013-08-28 ENCOUNTER — Encounter: Payer: Self-pay | Admitting: Cardiology

## 2013-08-28 VITALS — BP 102/70 | HR 56 | Ht 60.0 in | Wt 104.0 lb

## 2013-08-28 DIAGNOSIS — R9431 Abnormal electrocardiogram [ECG] [EKG]: Secondary | ICD-10-CM

## 2013-08-28 NOTE — Patient Instructions (Addendum)
Your physician recommends that you schedule a follow-up appointment in: February 2015 with Dr Delton See  Your physician has requested that you have an echocardiogram. Echocardiography is a painless test that uses sound waves to create images of your heart. It provides your doctor with information about the size and shape of your heart and how well your heart's chambers and valves are working. This procedure takes approximately one hour. There are no restrictions for this procedure.

## 2013-08-28 NOTE — Progress Notes (Signed)
Patient ID: Melissa Waters, female   DOB: [birth year], 62 y.o.   MRN: [id number]  Patient Name: Melissa Waters Date of Encounter: 08/28/2013  Primary Care Provider:   Duane Lope, MD Primary Cardiologist:  Tobias Alexander, MD  Patient Profile  Pre-operative evaluation  Problem List   Past Medical History  Diagnosis Date  . Hypertension   . Heart murmur     as a baby  . COPD (chronic obstructive pulmonary disease)   . Osteoarthritis     right hip  . Diabetes mellitus without complication     no meds   . Nephritis     Hx: of 1958  . Diverticulosis     Hx: of  . Osteoarthritis of left hip 08/13/2013   Past Surgical History  Procedure Laterality Date  . Total hip arthroplasty Right     05/27/2009  . Colonoscopy w/ polypectomy  2006  . Total hip arthroplasty Left 08/13/2013    Procedure: TOTAL HIP ARTHROPLASTY;  Surgeon: Eulas Post, MD;  Location: [facility] OR;  Service: Orthopedics;  Laterality: Left;    Allergies  Allergies  Allergen Reactions  . Amoxil [Amoxicillin] Diarrhea and Nausea And Vomiting    HPI  A delightful 62 year old female with h/o HTN, smoking, FH of CAD who was referred for a pre-operative evaluation for a hip replacement that was on 08/14/2013.  The patient tolerated surgery very well and has started rehabilitation. She denies any symptoms currently specifically she has no chest pain, shortness of breath, palpitations, dizziness or syncope. She is still actively smoking.  Home Medications  Prior to Admission medications   Medication Sig Start Date End Date Taking? Authorizing Provider  Calcium Carbonate-Vitamin D (CALCIUM 600 + D PO) Take 1 tablet by mouth daily.   Yes Historical Provider, MD  Carboxymethylcellul-Glycerin (CLEAR EYES FOR DRY EYES OP) Place 1 drop into both eyes 2 (two) times daily.   Yes Historical Provider, MD  Cinnamon 500 MG capsule Take 1,000 mg by mouth daily.   Yes Historical Provider, MD  clindamycin (CLEOCIN) 300 MG capsule  Take 600 mg by mouth once as needed (prior to dental work).   Yes Historical Provider, MD  ibuprofen (ADVIL,MOTRIN) 200 MG tablet Take 400 mg by mouth 2 (two) times daily as needed for pain.   Yes Historical Provider, MD  MILK THISTLE PO Take 350 mg by mouth daily.   Yes Historical Provider, MD  quinapril (ACCUPRIL) 20 MG tablet Take 20 mg by mouth daily before breakfast.    Yes Historical Provider, MD  carvedilol (COREG) 3.125 MG tablet Take 1 tablet (3.125 mg total) by mouth 2 (two) times daily. 06/11/13   Lars Masson, MD    Family History  Father - CABG in his 81' Mother - HTN Uncle - fatal MI in his 30'  Social History  History   Social History  . Marital Status: Married    Spouse Name: N/A    Number of Children: N/A  . Years of Education: N/A   Occupational History  . Not on file.   Social History Main Topics  . Smoking status: Current Every Day Smoker -- 0.20 packs/day for 44 years    Types: Cigarettes  . Smokeless tobacco: Never Used  . Alcohol Use: Yes     Comment: social  . Drug Use: No  . Sexual Activity: Not on file   Other Topics Concern  . Not on file   Social History Narrative  . No  narrative on file     Review of Systems General:  No chills, fever, night sweats or weight changes.  Cardiovascular:  No chest pain, dyspnea on exertion, edema, orthopnea, palpitations, paroxysmal nocturnal dyspnea. Dermatological: No rash, lesions/masses Respiratory: No cough, dyspnea Urologic: No hematuria, dysuria Abdominal:   No nausea, vomiting, diarrhea, bright red blood per rectum, melena, or hematemesis Neurologic:  No visual changes, wkns, changes in mental status. All other systems reviewed and are otherwise negative except as noted above.  Physical Exam  Blood pressure 102/70, pulse 56, height 5' (1.524 m), weight 104 lb (47.174 kg), SpO2 89.00%.  General: Pleasant, NAD Psych: Normal affect. Neuro: Alert and oriented X 3. Moves all extremities  spontaneously. HEENT: Normal  Neck: Supple without bruits or JVD. Lungs:  Resp regular and unlabored, CTA. Heart: RRR no s3, s4, or murmurs. Abdomen: Soft, non-tender, non-distended, BS + x 4.  Extremities: No clubbing, cyanosis or edema. DP/PT/Radials 2+ and equal bilaterally.  Accessory Clinical Findings  ECG - SR, HR 68/minute, PR 152, QRS 92, QT 418 ms, negative T waves in V3-5, nonspecific changes in I, II,III  Assessment & Plan  62 year old female with h/o HTN, smoking, family h/o CAD, no known heart disease but abnormal ECG. The patient is completely asymptomatic and doesn't wish to proceed with further stress testing. We will order an echocardiogram to evaluate for left ventricular function, wall motion, and right sided pressures.  The patient will start more intense rehab, if she developes any symptoms we will consider further testing then.  Follow up in 3 months.  Hypertension - controlled on current regimen  Tobias Alexander, Rexene Edison, MD 08/28/2013, 9:50 AM

## 2013-11-14 ENCOUNTER — Ambulatory Visit (HOSPITAL_COMMUNITY): Payer: 59 | Attending: Cardiology | Admitting: Radiology

## 2013-11-14 DIAGNOSIS — J449 Chronic obstructive pulmonary disease, unspecified: Secondary | ICD-10-CM | POA: Insufficient documentation

## 2013-11-14 DIAGNOSIS — I079 Rheumatic tricuspid valve disease, unspecified: Secondary | ICD-10-CM | POA: Insufficient documentation

## 2013-11-14 DIAGNOSIS — E119 Type 2 diabetes mellitus without complications: Secondary | ICD-10-CM | POA: Insufficient documentation

## 2013-11-14 DIAGNOSIS — I10 Essential (primary) hypertension: Secondary | ICD-10-CM | POA: Insufficient documentation

## 2013-11-14 DIAGNOSIS — J4489 Other specified chronic obstructive pulmonary disease: Secondary | ICD-10-CM | POA: Insufficient documentation

## 2013-11-14 DIAGNOSIS — F17200 Nicotine dependence, unspecified, uncomplicated: Secondary | ICD-10-CM | POA: Insufficient documentation

## 2013-11-14 DIAGNOSIS — R9431 Abnormal electrocardiogram [ECG] [EKG]: Secondary | ICD-10-CM | POA: Insufficient documentation

## 2013-11-14 DIAGNOSIS — Z8249 Family history of ischemic heart disease and other diseases of the circulatory system: Secondary | ICD-10-CM | POA: Insufficient documentation

## 2013-11-14 DIAGNOSIS — R011 Cardiac murmur, unspecified: Secondary | ICD-10-CM | POA: Insufficient documentation

## 2013-11-14 NOTE — Progress Notes (Signed)
Echocardiogram performed.  

## 2013-11-23 ENCOUNTER — Ambulatory Visit (INDEPENDENT_AMBULATORY_CARE_PROVIDER_SITE_OTHER): Payer: 59 | Admitting: Cardiology

## 2013-11-23 VITALS — BP 148/85 | HR 68 | Wt 104.0 lb

## 2013-11-23 DIAGNOSIS — R9431 Abnormal electrocardiogram [ECG] [EKG]: Secondary | ICD-10-CM | POA: Insufficient documentation

## 2013-11-23 DIAGNOSIS — I10 Essential (primary) hypertension: Secondary | ICD-10-CM | POA: Insufficient documentation

## 2013-11-23 DIAGNOSIS — R079 Chest pain, unspecified: Secondary | ICD-10-CM | POA: Insufficient documentation

## 2013-11-23 MED ORDER — ASPIRIN EC 81 MG PO TBEC: 81.0000 mg | DELAYED_RELEASE_TABLET | Freq: Every day | ORAL | Status: DC

## 2013-11-23 MED ORDER — NITROGLYCERIN 0.4 MG SL SUBL: 0.4000 mg | SUBLINGUAL_TABLET | SUBLINGUAL | Status: DC | PRN

## 2013-11-23 NOTE — Progress Notes (Signed)
Patient ID: Melissa Waters, female   DOB: 01-Jul-1951, 63 y.o.   MRN: [id number]    Patient Name: Melissa Waters Date of Encounter: 11/23/2013  Primary Care Provider:   Duane Lopeoss, Alan, MD Primary Cardiologist:  Tobias AlexanderKatarina Kaleigh Spiegelman, MD  Patient Profile  Pre-operative evaluation  Problem List   Past Medical History  Diagnosis Date  . Hypertension   . Heart murmur     as a baby  . COPD (chronic obstructive pulmonary disease)   . Osteoarthritis     right hip  . Diabetes mellitus without complication     no meds   . Nephritis     Hx: of 1958  . Diverticulosis     Hx: of  . Osteoarthritis of left hip 08/13/2013   Past Surgical History  Procedure Laterality Date  . Total hip arthroplasty Right     05/27/2009  . Colonoscopy w/ polypectomy  2006  . Total hip arthroplasty Left 08/13/2013    Procedure: TOTAL HIP ARTHROPLASTY;  Surgeon: Eulas PostJoshua P Landau, MD;  Location: [facility] OR;  Service: Orthopedics;  Laterality: Left;   Allergies  Allergies  Allergen Reactions  . Amoxil [Amoxicillin] Diarrhea and Nausea And Vomiting   HPI  A delightful 63 year old female with h/o HTN, smoking, FH of CAD who was referred for a pre-operative evaluation for a hip replacement that was on 08/14/2013.  The patient tolerated surgery very well and has started rehabilitation. She denies any symptoms currently specifically she has no chest pain, shortness of breath, palpitations, dizziness or syncope. She is still actively smoking.  This is 3 months follow up. The patient underwent rehabilitation after her hip replacement and tolerated it very well. She is trying to stay active and is asymptomatic. She states that about 3x/year she develops chest tightness that might last up to half an hour, she takes aspirin for it. No syncope, palpitations.   Home Medications  Prior to Admission medications   Medication Sig Start Date End Date Taking? Authorizing Provider  Calcium Carbonate-Vitamin D (CALCIUM 600 + D PO) Take  1 tablet by mouth daily.   Yes Historical Provider, MD  Carboxymethylcellul-Glycerin (CLEAR EYES FOR DRY EYES OP) Place 1 drop into both eyes 2 (two) times daily.   Yes Historical Provider, MD  Cinnamon 500 MG capsule Take 1,000 mg by mouth daily.   Yes Historical Provider, MD  clindamycin (CLEOCIN) 300 MG capsule Take 600 mg by mouth once as needed (prior to dental work).   Yes Historical Provider, MD  ibuprofen (ADVIL,MOTRIN) 200 MG tablet Take 400 mg by mouth 2 (two) times daily as needed for pain.   Yes Historical Provider, MD  MILK THISTLE PO Take 350 mg by mouth daily.   Yes Historical Provider, MD  quinapril (ACCUPRIL) 20 MG tablet Take 20 mg by mouth daily before breakfast.    Yes Historical Provider, MD  carvedilol (COREG) 3.125 MG tablet Take 1 tablet (3.125 mg total) by mouth 2 (two) times daily. 06/11/13   Lars MassonKatarina H Kengo Sturges, MD    Family History  Father - CABG in his 5260' Mother - HTN Uncle - fatal MI in his 6760'  Social History  History   Social History  . Marital Status: Married    Spouse Name: N/A    Number of Children: N/A  . Years of Education: N/A   Occupational History  . Not on file.   Social History Main Topics  . Smoking status: Current Every Day Smoker -- 0.20 packs/day  for 44 years    Types: Cigarettes  . Smokeless tobacco: Never Used  . Alcohol Use: Yes     Comment: social  . Drug Use: No  . Sexual Activity: Not on file   Other Topics Concern  . Not on file   Social History Narrative  . No narrative on file     Review of Systems General:  No chills, fever, night sweats or weight changes.  Cardiovascular:  No chest pain, dyspnea on exertion, edema, orthopnea, palpitations, paroxysmal nocturnal dyspnea. Dermatological: No rash, lesions/masses Respiratory: No cough, dyspnea Urologic: No hematuria, dysuria Abdominal:   No nausea, vomiting, diarrhea, bright red blood per rectum, melena, or hematemesis Neurologic:  No visual changes, wkns, changes in  mental status. All other systems reviewed and are otherwise negative except as noted above.  Physical Exam  Blood pressure 148/85, pulse 68, weight 104 lb (47.174 kg).  General: Pleasant, NAD Psych: Normal affect. Neuro: Alert and oriented X 3. Moves all extremities spontaneously. HEENT: Normal  Neck: Supple without bruits or JVD. Lungs:  Resp regular and unlabored, CTA. Heart: RRR no s3, s4, or murmurs. Abdomen: Soft, non-tender, non-distended, BS + x 4.  Extremities: No clubbing, cyanosis or edema. DP/PT/Radials 2+ and equal bilaterally.  Accessory Clinical Findings  ECG - SR, HR 68/minute, PR 152, QRS 92, QT 418 ms, negative T waves in V3-5, nonspecific changes in I, II,III  Echocardiogram 11/14/2013  - Left ventricle: The cavity size was normal. Systolic function was normal. The estimated ejection fraction was in the range of 60% to 65%. Wall motion was normal; there were no regional wall motion abnormalities. Doppler parameters are consistent with abnormal left ventricular relaxation (grade 1 diastolic dysfunction). There was no evidence of elevated ventricular filling pressure by Doppler parameters. - Aortic valve: Trileaflet; normal thickness leaflets. No regurgitation. - Aortic root: The aortic root was normal in size. - Mitral valve: Structurally normal valve. No regurgitation. - Left atrium: The atrium was normal in size. - Right ventricle: Systolic function was normal. - Tricuspid valve: Mild regurgitation. - Pulmonic valve: No regurgitation. - Pulmonary arteries: Systolic pressure was within the normal range.    Assessment & Plan  63 year old female with h/o HTN, smoking, family h/o CAD, no known heart disease  1. abnormal ECG, occasional chest pains - she doesn't wish to proceed with further stress testing. The echocardiogram showed normal systolic LVEF, impaired relaxation with normal filling pressures and normal RVSP.  She has occasional chest pains but  still doesn't want to have a stress test.  We will prescribe daily aspirin 81 mg po daily and 0.4 sl NTG PRN.  She was explained in details warning signs of unstable angina or MI. She understands and will seek medical attention if she develops any of those symptoms.   2. Hypertension - controlled on current regimen  3. Lipids - followed by PCP.   4. Smoking cessation - counseling provided  Follow up in 6 months  Tobias Alexander, Rexene Edison, MD 11/23/2013, 9:51 AM

## 2013-11-23 NOTE — Patient Instructions (Addendum)
Your physician has recommended you make the following change in your medication: start taking Aspirin 81 mg daily and Nitroglycerin 0.4 mg as needed for chest pain. Please folllow instructions given to you at today's office visit and on the medications bottle.  Your physician wants you to follow-up in: 6 months. You will receive a reminder letter in the mail two months in advance. If you don't receive a letter, please call our office to schedule the follow-up appointment.

## 2014-01-16 ENCOUNTER — Other Ambulatory Visit: Payer: Self-pay

## 2014-01-16 DIAGNOSIS — R9431 Abnormal electrocardiogram [ECG] [EKG]: Secondary | ICD-10-CM

## 2014-01-16 MED ORDER — CARVEDILOL 3.125 MG PO TABS: 3.1250 mg | ORAL_TABLET | Freq: Two times a day (BID) | ORAL | Status: DC

## 2014-01-16 NOTE — Telephone Encounter (Signed)
LMTCB

## 2014-01-18 NOTE — Telephone Encounter (Signed)
This is an old message from 10/14. The pt has no questions or concerns at this time.

## 2014-04-18 ENCOUNTER — Other Ambulatory Visit: Payer: Self-pay

## 2014-04-18 DIAGNOSIS — Z1231 Encounter for screening mammogram for malignant neoplasm of breast: Secondary | ICD-10-CM

## 2014-04-29 ENCOUNTER — Ambulatory Visit
Admission: RE | Admit: 2014-04-29 | Discharge: 2014-04-29 | Disposition: A | Discharge status: Home / Self Care | Payer: 59 | Source: Ambulatory Visit

## 2014-04-29 DIAGNOSIS — Z1231 Encounter for screening mammogram for malignant neoplasm of breast: Secondary | ICD-10-CM

## 2014-05-27 ENCOUNTER — Ambulatory Visit (INDEPENDENT_AMBULATORY_CARE_PROVIDER_SITE_OTHER): Payer: 59 | Admitting: Cardiology

## 2014-05-27 VITALS — BP 110/72 | HR 57 | Ht 60.0 in | Wt 106.0 lb

## 2014-05-27 DIAGNOSIS — R9431 Abnormal electrocardiogram [ECG] [EKG]: Secondary | ICD-10-CM

## 2014-05-27 DIAGNOSIS — R072 Precordial pain: Secondary | ICD-10-CM

## 2014-05-27 DIAGNOSIS — I10 Essential (primary) hypertension: Secondary | ICD-10-CM

## 2014-05-27 NOTE — Progress Notes (Signed)
Patient ID: CORRINNA KARAPETYAN, female   DOB: 10/27/1950, 63 y.o.   MRN: [id number]    Patient Name: Melissa Waters Date of Encounter: 05/27/2014  Primary Care Provider:   Duane Lope, MD Primary Cardiologist:  Tobias Alexander, MD  Patient Profile  Pre-operative evaluation  Problem List   Past Medical History  Diagnosis Date  . Hypertension   . Heart murmur     as a baby  . COPD (chronic obstructive pulmonary disease)   . Osteoarthritis     right hip  . Diabetes mellitus without complication     no meds   . Nephritis     Hx: of 1958  . Diverticulosis     Hx: of  . Osteoarthritis of left hip 08/13/2013   Past Surgical History  Procedure Laterality Date  . Total hip arthroplasty Right     05/27/2009  . Colonoscopy w/ polypectomy  2006  . Total hip arthroplasty Left 08/13/2013    Procedure: TOTAL HIP ARTHROPLASTY;  Surgeon: Eulas Post, MD;  Location: [facility] OR;  Service: Orthopedics;  Laterality: Left;   Allergies  Allergies  Allergen Reactions  . Amoxil [Amoxicillin] Diarrhea and Nausea And Vomiting   HPI  A delightful 63 year old female with h/o HTN, smoking, FH of CAD who was referred for a pre-operative evaluation for a hip replacement that was on 08/14/2013.  The patient tolerated surgery very well and has started rehabilitation. She denies any symptoms currently specifically she has no chest pain, shortness of breath, palpitations, dizziness or syncope. She is still actively smoking.  This is 6 months follow up. The patient is experiencing exertional chest pains that are relived by NTG. No palpitations or syncope. Dyspnea on moderate exertion. She continues to smoke. He father had CABG x 4.    Home Medications  Prior to Admission medications   Medication Sig Start Date End Date Taking? Authorizing Provider  Calcium Carbonate-Vitamin D (CALCIUM 600 + D PO) Take 1 tablet by mouth daily.   Yes Historical Provider, MD  Carboxymethylcellul-Glycerin (CLEAR EYES FOR  DRY EYES OP) Place 1 drop into both eyes 2 (two) times daily.   Yes Historical Provider, MD  Cinnamon 500 MG capsule Take 1,000 mg by mouth daily.   Yes Historical Provider, MD  clindamycin (CLEOCIN) 300 MG capsule Take 600 mg by mouth once as needed (prior to dental work).   Yes Historical Provider, MD  ibuprofen (ADVIL,MOTRIN) 200 MG tablet Take 400 mg by mouth 2 (two) times daily as needed for pain.   Yes Historical Provider, MD  MILK THISTLE PO Take 350 mg by mouth daily.   Yes Historical Provider, MD  quinapril (ACCUPRIL) 20 MG tablet Take 20 mg by mouth daily before breakfast.    Yes Historical Provider, MD  carvedilol (COREG) 3.125 MG tablet Take 1 tablet (3.125 mg total) by mouth 2 (two) times daily. 06/11/13   Lars Masson, MD    Family History  Father - CABG in his 75' Mother - HTN Uncle - fatal MI in his 64'  Social History  History   Social History  . Marital Status: Married    Spouse Name: N/A    Number of Children: N/A  . Years of Education: N/A   Occupational History  . Not on file.   Social History Main Topics  . Smoking status: Current Every Day Smoker -- 0.20 packs/day for 44 years    Types: Cigarettes  . Smokeless tobacco: Never Used  .  Alcohol Use: Yes     Comment: social  . Drug Use: No  . Sexual Activity: Not on file   Other Topics Concern  . Not on file   Social History Narrative  . No narrative on file     Review of Systems General:  No chills, fever, night sweats or weight changes.  Cardiovascular:  No chest pain, dyspnea on exertion, edema, orthopnea, palpitations, paroxysmal nocturnal dyspnea. Dermatological: No rash, lesions/masses Respiratory: No cough, dyspnea Urologic: No hematuria, dysuria Abdominal:   No nausea, vomiting, diarrhea, bright red blood per rectum, melena, or hematemesis Neurologic:  No visual changes, wkns, changes in mental status. All other systems reviewed and are otherwise negative except as noted  above.  Physical Exam  Blood pressure 110/72, pulse 57, height 5' (1.524 m), weight 106 lb (48.081 kg).  General: Pleasant, NAD Psych: Normal affect. Neuro: Alert and oriented X 3. Moves all extremities spontaneously. HEENT: Normal  Neck: Supple without bruits or JVD. Lungs:  Resp regular and unlabored, CTA. Heart: RRR no s3, s4, or murmurs. Abdomen: Soft, non-tender, non-distended, BS + x 4.  Extremities: No clubbing, cyanosis or edema. DP/PT/Radials 2+ and equal bilaterally.  Accessory Clinical Findings  ECG - SR, HR 68/minute, PR 152, QRS 92, QT 418 ms, negative T waves in V3-5, nonspecific changes in I, II,III  Echocardiogram 11/14/2013  - Left ventricle: The cavity size was normal. Systolic function was normal. The estimated ejection fraction was in the range of 60% to 65%. Wall motion was normal; there were no regional wall motion abnormalities. Doppler parameters are consistent with abnormal left ventricular relaxation (grade 1 diastolic dysfunction). There was no evidence of elevated ventricular filling pressure by Doppler parameters. - Aortic valve: Trileaflet; normal thickness leaflets. No regurgitation. - Aortic root: The aortic root was normal in size. - Mitral valve: Structurally normal valve. No regurgitation. - Left atrium: The atrium was normal in size. - Right ventricle: Systolic function was normal. - Tricuspid valve: Mild regurgitation. - Pulmonic valve: No regurgitation. - Pulmonary arteries: Systolic pressure was within the normal range.    Assessment & Plan  63 year old female with h/o HTN, smoking, family h/o CAD, no known heart disease  1. abnormal ECG, exertional chest pains - she didn't wish to proceed with further stress testing in February but is agreeable now. We will schedule a Lexiscan nuclear stress test. We will continue daily aspirin 81 mg po daily and 0.4 sl NTG PRN.  She was explained in details warning signs of unstable angina or  MI. She understands and will seek medical attention if she develops any of those symptoms.   2. Hypertension - controlled on current regimen  3. Lipids - followed by PCP.   4. Smoking cessation - counseling provided  Follow up in 6 months  Lars Masson, MD 05/27/2014, 12:19 PM

## 2014-05-27 NOTE — Patient Instructions (Signed)
Your physician has requested that you have a lexiscan myoview. For further information please visit [email or web address] Please follow instruction sheet, as given.  Your physician recommends that you continue on your current medications as directed. Please refer to the Current Medication list given to you today.  Your physician recommends that you schedule a follow-up appointment after myoview with Dr. Delton See.

## 2014-06-06 ENCOUNTER — Encounter (HOSPITAL_COMMUNITY): Payer: 59

## 2014-06-17 ENCOUNTER — Ambulatory Visit: Payer: 59 | Admitting: Cardiology

## 2014-08-23 ENCOUNTER — Other Ambulatory Visit: Payer: Self-pay

## 2014-08-23 DIAGNOSIS — R9431 Abnormal electrocardiogram [ECG] [EKG]: Secondary | ICD-10-CM

## 2014-08-23 MED ORDER — CARVEDILOL 3.125 MG PO TABS: 3.1250 mg | ORAL_TABLET | Freq: Two times a day (BID) | ORAL | Status: DC

## 2014-11-20 ENCOUNTER — Other Ambulatory Visit (HOSPITAL_COMMUNITY)
Admission: RE | Admit: 2014-11-20 | Discharge: 2014-11-20 | Disposition: A | Discharge status: Home / Self Care | Payer: 59 | Source: Ambulatory Visit | Attending: Family Medicine | Admitting: Family Medicine

## 2014-11-20 ENCOUNTER — Other Ambulatory Visit: Payer: Self-pay | Admitting: Family Medicine

## 2014-11-20 DIAGNOSIS — Z124 Encounter for screening for malignant neoplasm of cervix: Secondary | ICD-10-CM | POA: Insufficient documentation

## 2014-11-21 ENCOUNTER — Other Ambulatory Visit: Payer: Self-pay | Admitting: Family Medicine

## 2014-11-21 DIAGNOSIS — N6459 Other signs and symptoms in breast: Secondary | ICD-10-CM

## 2014-11-21 LAB — CYTOLOGY - PAP

## 2014-11-22 ENCOUNTER — Other Ambulatory Visit: Payer: Self-pay | Admitting: Family Medicine

## 2014-11-22 DIAGNOSIS — N6459 Other signs and symptoms in breast: Secondary | ICD-10-CM

## 2014-11-26 ENCOUNTER — Ambulatory Visit (INDEPENDENT_AMBULATORY_CARE_PROVIDER_SITE_OTHER): Payer: 59 | Admitting: Cardiology

## 2014-11-26 VITALS — BP 122/80 | HR 58 | Ht 60.0 in | Wt 108.0 lb

## 2014-11-26 DIAGNOSIS — R9431 Abnormal electrocardiogram [ECG] [EKG]: Secondary | ICD-10-CM

## 2014-11-26 DIAGNOSIS — R079 Chest pain, unspecified: Secondary | ICD-10-CM

## 2014-11-26 DIAGNOSIS — Z720 Tobacco use: Secondary | ICD-10-CM

## 2014-11-26 DIAGNOSIS — F17200 Nicotine dependence, unspecified, uncomplicated: Secondary | ICD-10-CM

## 2014-11-26 DIAGNOSIS — I10 Essential (primary) hypertension: Secondary | ICD-10-CM

## 2014-11-26 NOTE — Progress Notes (Signed)
Patient ID: Melissa PionLaura L Samudio, female   DOB: 1951-09-09, 64 y.o.   MRN: [id number]    Patient Name: Melissa PionLaura L Bahe Date of Encounter: 11/26/2014  Primary Care Provider:   Duane Lopeoss, Alan, MD Primary Cardiologist:  Tobias AlexanderKatarina Dusten Ellinwood, MD  Patient Profile  Pre-operative evaluation  Problem List   Past Medical History  Diagnosis Date  . Hypertension   . Heart murmur     as a baby  . COPD (chronic obstructive pulmonary disease)   . Osteoarthritis     right hip  . Diabetes mellitus without complication     no meds   . Nephritis     Hx: of 1958  . Diverticulosis     Hx: of  . Osteoarthritis of left hip 08/13/2013   Past Surgical History  Procedure Laterality Date  . Total hip arthroplasty Right     05/27/2009  . Colonoscopy w/ polypectomy  2006  . Total hip arthroplasty Left 08/13/2013    Procedure: TOTAL HIP ARTHROPLASTY;  Surgeon: Eulas PostJoshua P Landau, MD;  Location: [facility] OR;  Service: Orthopedics;  Laterality: Left;   Allergies  Allergies  Allergen Reactions  . Amoxil [Amoxicillin] Diarrhea and Nausea And Vomiting   HPI  A delightful 64 year old female with h/o HTN, smoking, FH of CAD who was referred for a pre-operative evaluation for a hip replacement that was on 08/14/2013.  The patient tolerated surgery very well and has started rehabilitation. She denies any symptoms currently specifically she has no chest pain, shortness of breath, palpitations, dizziness or syncope. She is still actively smoking.  This is 6 months follow up. The patient is experiencing exertional chest pains that are relived by NTG. No palpitations or syncope. Dyspnea on moderate exertion. She continues to smoke. He father had CABG x 4.  11/26/2014 - the patient is coming after 6 months, at the last visit she was scheduled for a stress test for abnormal ECG and DOE. She has cancelled the appointment as she was anxious about it. She has been having the same symptoms along with exertional chest tightness. She  continues to smoke. No syncope, no palpitations, no LE edema.     Home Medications  Prior to Admission medications   Medication Sig Start Date End Date Taking? Authorizing Provider  Calcium Carbonate-Vitamin D (CALCIUM 600 + D PO) Take 1 tablet by mouth daily.   Yes Historical Provider, MD  Carboxymethylcellul-Glycerin (CLEAR EYES FOR DRY EYES OP) Place 1 drop into both eyes 2 (two) times daily.   Yes Historical Provider, MD  Cinnamon 500 MG capsule Take 1,000 mg by mouth daily.   Yes Historical Provider, MD  clindamycin (CLEOCIN) 300 MG capsule Take 600 mg by mouth once as needed (prior to dental work).   Yes Historical Provider, MD  ibuprofen (ADVIL,MOTRIN) 200 MG tablet Take 400 mg by mouth 2 (two) times daily as needed for pain.   Yes Historical Provider, MD  MILK THISTLE PO Take 350 mg by mouth daily.   Yes Historical Provider, MD  quinapril (ACCUPRIL) 20 MG tablet Take 20 mg by mouth daily before breakfast.    Yes Historical Provider, MD  carvedilol (COREG) 3.125 MG tablet Take 1 tablet (3.125 mg total) by mouth 2 (two) times daily. 06/11/13   Lars MassonKatarina H Endre Coutts, MD    Family History  Father - CABG in his 5860' Mother - HTN Uncle - fatal MI in his 2260'  Social History  History   Social History  . Marital Status: Married  Spouse Name: N/A  . Number of Children: N/A  . Years of Education: N/A   Occupational History  . Not on file.   Social History Main Topics  . Smoking status: Current Every Day Smoker -- 0.20 packs/day for 44 years    Types: Cigarettes  . Smokeless tobacco: Never Used  . Alcohol Use: Yes     Comment: social  . Drug Use: No  . Sexual Activity: Not on file   Other Topics Concern  . Not on file   Social History Narrative  . No narrative on file     Review of Systems General:  No chills, fever, night sweats or weight changes.  Cardiovascular:  No chest pain, dyspnea on exertion, edema, orthopnea, palpitations, paroxysmal nocturnal  dyspnea. Dermatological: No rash, lesions/masses Respiratory: No cough, dyspnea Urologic: No hematuria, dysuria Abdominal:   No nausea, vomiting, diarrhea, bright red blood per rectum, melena, or hematemesis Neurologic:  No visual changes, wkns, changes in mental status. All other systems reviewed and are otherwise negative except as noted above.  Physical Exam  Blood pressure 122/80, pulse 58, height 5' (1.524 m), weight 108 lb (48.988 kg).  General: Pleasant, NAD Psych: Normal affect. Neuro: Alert and oriented X 3. Moves all extremities spontaneously. HEENT: Normal  Neck: Supple without bruits or JVD. Lungs:  Resp regular and unlabored, CTA. Heart: RRR no s3, s4, or murmurs. Abdomen: Soft, non-tender, non-distended, BS + x 4.  Extremities: No clubbing, cyanosis or edema. DP/PT/Radials 2+ and equal bilaterally.  Accessory Clinical Findings  ECG - SR, HR 68/minute, PR 152, QRS 92, QT 418 ms, negative T waves in V3-5, nonspecific changes in I, II,III  Echocardiogram 11/14/2013  - Left ventricle: The cavity size was normal. Systolic function was normal. The estimated ejection fraction was in the range of 60% to 65%. Wall motion was normal; there were no regional wall motion abnormalities. Doppler parameters are consistent with abnormal left ventricular relaxation (grade 1 diastolic dysfunction). There was no evidence of elevated ventricular filling pressure by Doppler parameters. - Aortic valve: Trileaflet; normal thickness leaflets. No regurgitation. - Aortic root: The aortic root was normal in size. - Mitral valve: Structurally normal valve. No regurgitation. - Left atrium: The atrium was normal in size. - Right ventricle: Systolic function was normal. - Tricuspid valve: Mild regurgitation. - Pulmonic valve: No regurgitation. - Pulmonary arteries: Systolic pressure was within the normal range.    Assessment & Plan  64 year old female with h/o HTN, smoking, family h/o  CAD, no known heart disease  1. Abnormal ECG, exertional chest pains - she didn't wish to proceed with further stress testing in February but is agreeable now. He r ECG now shows new inferolateral STD and negative T waves, previously only in lateral leads. I recommended cardiac cath, she prefers a stress test.  We will schedule a Lexiscan nuclear stress test. We will continue daily aspirin 81 mg po daily and 0.4 sl NTG PRN.  She was explained in details warning signs of unstable angina or MI. She understands and will seek medical attention if she develops any of those symptoms.   2. Hypertension - controlled on current regimen  3. Lipids - followed by PCP.   4. Smoking cessation - counseling provided  Follow up in 1 months.  Lars Masson, MD 11/26/2014, 11:28 AM

## 2014-11-26 NOTE — Patient Instructions (Signed)
Your physician recommends that you continue on your current medications as directed. Please refer to the Current Medication list given to you today.    Your physician has requested that you have a lexiscan myoview. For further information please visit [email or web address]. Please follow instruction sheet, as given.    Your physician recommends that you schedule a follow-up appointment in: WITH DR NELSON BASED ON YOUR STRESS TEST RESULTS

## 2014-11-28 ENCOUNTER — Ambulatory Visit
Admission: RE | Admit: 2014-11-28 | Discharge: 2014-11-28 | Disposition: A | Discharge status: Home / Self Care | Payer: 59 | Source: Ambulatory Visit | Attending: Family Medicine | Admitting: Family Medicine

## 2014-11-28 DIAGNOSIS — N6459 Other signs and symptoms in breast: Secondary | ICD-10-CM

## 2014-12-12 ENCOUNTER — Ambulatory Visit (HOSPITAL_COMMUNITY): Payer: 59 | Attending: Cardiology | Admitting: Radiology

## 2014-12-12 DIAGNOSIS — R9431 Abnormal electrocardiogram [ECG] [EKG]: Secondary | ICD-10-CM | POA: Insufficient documentation

## 2014-12-12 DIAGNOSIS — I10 Essential (primary) hypertension: Secondary | ICD-10-CM | POA: Diagnosis not present

## 2014-12-12 DIAGNOSIS — R079 Chest pain, unspecified: Secondary | ICD-10-CM | POA: Diagnosis not present

## 2014-12-12 DIAGNOSIS — R0609 Other forms of dyspnea: Secondary | ICD-10-CM | POA: Diagnosis not present

## 2014-12-12 MED ORDER — TECHNETIUM TC 99M SESTAMIBI GENERIC - CARDIOLITE
33.0000 | Freq: Once | INTRAVENOUS | Status: AC | PRN
  Administered 2014-12-12: 33 via INTRAVENOUS

## 2014-12-12 MED ORDER — TECHNETIUM TC 99M SESTAMIBI GENERIC - CARDIOLITE
11.0000 | Freq: Once | INTRAVENOUS | Status: AC | PRN
  Administered 2014-12-12: 11 via INTRAVENOUS

## 2014-12-12 MED ORDER — REGADENOSON 0.4 MG/5ML IV SOLN
0.4000 mg | Freq: Once | INTRAVENOUS | Status: AC
  Administered 2014-12-12: 0.4 mg via INTRAVENOUS

## 2014-12-12 NOTE — Progress Notes (Signed)
[facility] 3 NUCLEAR MED [address] [place], [place] [address] [phone]    Cardiology Nuclear Med Study  Melissa Waters is a 64 y.o. female     MRN : [id number]     DOB: Jun 10, 1951  Procedure Date: 12/12/2014  Nuclear Med Background Indication for Stress Test:  Evaluation for Ischemia and Abnormal EKG History:  No known CAD Cardiac Risk Factors: Hypertension  Symptoms:  Chest Pain and DOE   Nuclear Pre-Procedure Caffeine/Decaff Intake:  None NPO After: 7:30am   Lungs:  clear O2 Sat: 98% on room air. IV 0.9% NS with Angio Cath:  22g  IV Site: R Antecubital  IV Started by:  Bonnita Levan, RN  Chest Size (in):  36 Cup Size: B  Height: 4' 11.5" (1.511 m)  Weight:  108 lb (48.988 kg)  BMI:  Body mass index is 21.46 kg/(m^2). Tech Comments:  N/A    Nuclear Med Study 1 or 2 day study: 1 day  Stress Test Type:  Lexiscan  Reading MD: N/A  Order Authorizing Provider:  Tobias Alexander, MD  Resting Radionuclide: Technetium 5m Sestamibi  Resting Radionuclide Dose: 11.0 mCi   Stress Radionuclide:  Technetium 34m Sestamibi  Stress Radionuclide Dose: 33.0 mCi           Stress Protocol Rest HR: 62 Stress HR: 86  Rest BP: 127/85 Stress BP: 131/83  Exercise Time (min): n/a METS: n/a   Predicted Max HR: 157 bpm % Max HR: 54.78 bpm Rate Pressure Product: 21308   Dose of Adenosine (mg):  n/a Dose of Lexiscan: 0.4 mg  Dose of Atropine (mg): n/a Dose of Dobutamine: n/a mcg/kg/min (at max HR)  Stress Test Technologist: Nelson Chimes, BS-ES  Nuclear Technologist:  Kerby Nora, CNMT     Rest Procedure:  Myocardial perfusion imaging was performed at rest 45 minutes following the intravenous administration of Technetium 59m Sestamibi. Rest ECG: NSR , TWI in the anterior lateral leads.   Stress Procedure:  The patient received IV Lexiscan 0.4 mg over 15-seconds.  Technetium 48m Sestamibi injected at 30-seconds.  Quantitative spect images were obtained after a 45  minute delay.  During the infusion of Lexiscan the patient complained of headache and SOB that began to subside in recovery.  Stress ECG: No significant change from baseline ECG  QPS Raw Data Images:  Normal; no motion artifact; normal heart/lung ratio. Stress Images:  There is a small area of moderate attenuation of the distal anterior wall.    Rest Images:  There is a small area of moderate attenuation of the distal anterior wall. Subtraction (SDS):  No evidence of ischemia. Transient Ischemic Dilatation (Normal <1.22):  1.14 Lung/Heart Ratio (Normal <0.45):  0.21  Quantitative Gated Spect Images QGS EDV:  96 ml QGS ESV:  47 ml  Impression Exercise Capacity:  Lexiscan with no exercise. BP Response:  Normal blood pressure response. Clinical Symptoms:  No significant symptoms noted. ECG Impression:  No significant ST segment change suggestive of ischemia. Comparison with Prior Nuclear Study: No images to compare  Overall Impression:  Low risk stress nuclear study .   There is attenuation of the distal anterior wall but no evidence of ischemia.  The contractility in that area is well preserved. .  LV Ejection Fraction: 51%.  LV Wall Motion:  NL LV Function; NL Wall Motion.     Visually the LV function is greater than the 51% calculated.    Vesta Mixer, Montez Hageman., MD, [facility] 12/12/2014,  5:14 PM [address],  [address] Office [phone]- [phone] Pager [phone]- [phone]

## 2014-12-16 ENCOUNTER — Telehealth: Payer: Self-pay | Admitting: *Deleted

## 2014-12-16 MED ORDER — ISOSORBIDE MONONITRATE ER 30 MG PO TB24: 30.0000 mg | ORAL_TABLET | Freq: Every day | ORAL | Status: DC

## 2014-12-16 NOTE — Telephone Encounter (Signed)
Notified the pt that per Dr Delton SeeNelson, she recommends the pt add Imdur 30 mg po daily to her med regimen, and schedule a follow-up appt with her in 2 months.  Confirmed the pharmacy of choice with the pt. Informed the pt that someone from our scheduling dept will be contacting her to have her 2 month follow-up appt scheduled with Dr Delton SeeNelson.  Pt verbalized understanding and agrees with this plan.

## 2014-12-16 NOTE — Telephone Encounter (Signed)
-----   Message from Lars MassonKatarina H Nelson, MD sent at 12/16/2014  8:25 AM EDT ----- I would like to add Imdur 30 mg po daily to her medical regimen and follow her in 2 months.    ----- Message -----    From: Julio SicksJennifer L Bowers, LPN    Sent: 9/[id number]/45403/08/2015   1:03 PM      To: Lars MassonKatarina H Nelson, MD  Informed pt of stress test results. Pt inquired about follow up. Informed pt from last OV it says it would be based on stress test results. Informed pt that I would contact Dr. Delton SeeNelson and see when she would like for her to follow up. Pt verbalized understanding and was in agreement with this plan.

## 2015-01-11 ENCOUNTER — Other Ambulatory Visit: Payer: Self-pay | Admitting: Cardiology

## 2015-02-07 ENCOUNTER — Encounter: Payer: Self-pay | Admitting: Cardiology

## 2015-02-07 ENCOUNTER — Ambulatory Visit (INDEPENDENT_AMBULATORY_CARE_PROVIDER_SITE_OTHER): Payer: 59 | Admitting: Cardiology

## 2015-02-07 VITALS — BP 118/54 | HR 70 | Ht 59.0 in | Wt 109.6 lb

## 2015-02-07 DIAGNOSIS — E785 Hyperlipidemia, unspecified: Secondary | ICD-10-CM | POA: Diagnosis not present

## 2015-02-07 DIAGNOSIS — Z720 Tobacco use: Secondary | ICD-10-CM

## 2015-02-07 DIAGNOSIS — R072 Precordial pain: Secondary | ICD-10-CM | POA: Diagnosis not present

## 2015-02-07 DIAGNOSIS — F17200 Nicotine dependence, unspecified, uncomplicated: Secondary | ICD-10-CM

## 2015-02-07 DIAGNOSIS — I10 Essential (primary) hypertension: Secondary | ICD-10-CM

## 2015-02-07 NOTE — Progress Notes (Signed)
Patient ID: Melissa Waters, female   DOB: May 15, 1951, 64 y.o.   MRN: [id number]    Patient Name: Melissa PionLaura L Waters Date of Encounter: 02/07/2015  Primary Care Provider:   Duane Lopeoss, Alan, MD Primary Cardiologist:  Tobias AlexanderKatarina Dalayah Deahl, MD  Chief complain: chest pain  Problem List   Past Medical History  Diagnosis Date  . Hypertension   . Heart murmur     as a baby  . COPD (chronic obstructive pulmonary disease)   . Osteoarthritis     right hip  . Diabetes mellitus without complication     no meds   . Nephritis     Hx: of 1958  . Diverticulosis     Hx: of  . Osteoarthritis of left hip 08/13/2013   Past Surgical History  Procedure Laterality Date  . Total hip arthroplasty Right     05/27/2009  . Colonoscopy w/ polypectomy  2006  . Total hip arthroplasty Left 08/13/2013    Procedure: TOTAL HIP ARTHROPLASTY;  Surgeon: Eulas PostJoshua P Landau, MD;  Location: [facility] OR;  Service: Orthopedics;  Laterality: Left;   Allergies  Allergies  Allergen Reactions  . Amoxil [Amoxicillin] Diarrhea and Nausea And Vomiting   HPI  A delightful 64 year old female with h/o HTN, smoking, FH of CAD who was referred for a pre-operative evaluation for a hip replacement that was on 08/14/2013.  The patient tolerated surgery very well and has started rehabilitation. She denies any symptoms currently specifically she has no chest pain, shortness of breath, palpitations, dizziness or syncope. She is still actively smoking.  This is 6 months follow up. The patient is experiencing exertional chest pains that are relived by NTG. No palpitations or syncope. Dyspnea on moderate exertion. She continues to smoke. He father had CABG x 4.  11/26/2014 - the patient is coming after 6 months, at the last visit she was scheduled for a stress test for abnormal ECG and DOE. She has cancelled the appointment as she was anxious about it. She has been having the same symptoms along with exertional chest tightness. She continues to smoke. No  syncope, no palpitations, no LE edema.  02/07/2015 - patient is coming after 3 months, she underwent Lexiscan nuclear stress test that was negative for infarct or ischemia. The patient states that she is she started indoor her chest pain has improved significantly and since the last visit she only had one episode. She continues to smokes 5-6 cigarettes at night. She is minimally active as she has arthritis in her left foot and is awaiting surgery. She otherwise denies palpitations or syncope. She has stable dyspnea on exertion. No orthopnea or paroxysmal nocturnal dyspnea.    Home Medications  Prior to Admission medications   Medication Sig Start Date End Date Taking? Authorizing Provider  Calcium Carbonate-Vitamin D (CALCIUM 600 + D PO) Take 1 tablet by mouth daily.   Yes Historical Provider, MD  Carboxymethylcellul-Glycerin (CLEAR EYES FOR DRY EYES OP) Place 1 drop into both eyes 2 (two) times daily.   Yes Historical Provider, MD  Cinnamon 500 MG capsule Take 1,000 mg by mouth daily.   Yes Historical Provider, MD  clindamycin (CLEOCIN) 300 MG capsule Take 600 mg by mouth once as needed (prior to dental work).   Yes Historical Provider, MD  ibuprofen (ADVIL,MOTRIN) 200 MG tablet Take 400 mg by mouth 2 (two) times daily as needed for pain.   Yes Historical Provider, MD  MILK THISTLE PO Take 350 mg by mouth daily.  Yes Historical Provider, MD  quinapril (ACCUPRIL) 20 MG tablet Take 20 mg by mouth daily before breakfast.    Yes Historical Provider, MD  carvedilol (COREG) 3.125 MG tablet Take 1 tablet (3.125 mg total) by mouth 2 (two) times daily. 06/11/13   Lars Masson, MD    Family History  Father - CABG in his 25' Mother - HTN Uncle - fatal MI in his 21'  Social History  History   Social History  . Marital Status: Married    Spouse Name: N/A  . Number of Children: N/A  . Years of Education: N/A   Occupational History  . Not on file.   Social History Main Topics  . Smoking  status: Current Every Day Smoker -- 0.20 packs/day for 44 years    Types: Cigarettes  . Smokeless tobacco: Never Used  . Alcohol Use: Yes     Comment: social  . Drug Use: No  . Sexual Activity: Not on file   Other Topics Concern  . Not on file   Social History Narrative  . No narrative on file     Review of Systems General:  No chills, fever, night sweats or weight changes.  Cardiovascular:  No chest pain, dyspnea on exertion, edema, orthopnea, palpitations, paroxysmal nocturnal dyspnea. Dermatological: No rash, lesions/masses Respiratory: No cough, dyspnea Urologic: No hematuria, dysuria Abdominal:   No nausea, vomiting, diarrhea, bright red blood per rectum, melena, or hematemesis Neurologic:  No visual changes, wkns, changes in mental status. All other systems reviewed and are otherwise negative except as noted above.  Physical Exam  There were no vitals taken for this visit.  General: Pleasant, NAD Psych: Normal affect. Neuro: Alert and oriented X 3. Moves all extremities spontaneously. HEENT: Normal  Neck: Supple without bruits or JVD. Lungs:  Resp regular and unlabored, CTA. Heart: RRR no s3, s4, or murmurs. Abdomen: Soft, non-tender, non-distended, BS + x 4.  Extremities: No clubbing, cyanosis or edema. DP/PT/Radials 2+ and equal bilaterally.  Accessory Clinical Findings  ECG - SR, HR 68/minute, PR 152, QRS 92, QT 418 ms, negative T waves in V3-5, nonspecific changes in I, II,III  Echocardiogram 11/14/2013  - Left ventricle: The cavity size was normal. Systolic function was normal. The estimated ejection fraction was in the range of 60% to 65%. Wall motion was normal; there were no regional wall motion abnormalities. Doppler parameters are consistent with abnormal left ventricular relaxation (grade 1 diastolic dysfunction). There was no evidence of elevated ventricular filling pressure by Doppler parameters. - Aortic valve: Trileaflet; normal thickness  leaflets. No regurgitation. - Aortic root: The aortic root was normal in size. - Mitral valve: Structurally normal valve. No regurgitation. - Left atrium: The atrium was normal in size. - Right ventricle: Systolic function was normal. - Tricuspid valve: Mild regurgitation. - Pulmonic valve: No regurgitation. - Pulmonary arteries: Systolic pressure was within the normal range.  Lexiscan nuclear stress test: 12/12/2014 Impression Exercise Capacity: Lexiscan with no exercise. BP Response: Normal blood pressure response. Clinical Symptoms: No significant symptoms noted. ECG Impression: No significant ST segment change suggestive of ischemia. Comparison with Prior Nuclear Study: No images to compare  Overall Impression: Low risk stress nuclear study . There is attenuation of the distal anterior wall but no evidence of ischemia. The contractility in that area is well preserved. .  LV Ejection Fraction: 51%. LV Wall Motion: NL LV Function; NL Wall Motion. Visually the LV function is greater than the 51% calculated.  Assessment & Plan  64 year old female with h/o HTN, smoking, family h/o CAD, no known heart disease  1. Abnormal ECG, exertional chest pains - she didn't wish to proceed with further stress testing in February but is agreeable now. Her ECG now shows new inferolateral STD and negative T waves, a Lexiscan nuclear stress test was negative for infarct or ischemia. Her chest pain has improved with Imdur 30 mg daily we will continue, we will also continue daily aspirin 81 mg po daily and 0.4 sl NTG PRN.  She is advised to increase her physical activity.  2. Hypertension - controlled on current regimen  3. Lipids - followed by PCP.   4. Smoking cessation - counseling provided  Follow up in 1 year.  Lars MassonNELSON, Carleigh Buccieri H, MD 02/07/2015, 1:07 PM

## 2015-02-07 NOTE — Patient Instructions (Signed)

## 2015-02-14 ENCOUNTER — Ambulatory Visit: Payer: 59 | Admitting: Cardiology

## 2015-03-12 ENCOUNTER — Other Ambulatory Visit: Payer: Self-pay | Admitting: Cardiology

## 2015-04-24 ENCOUNTER — Other Ambulatory Visit: Payer: Self-pay

## 2015-04-24 DIAGNOSIS — Z1231 Encounter for screening mammogram for malignant neoplasm of breast: Secondary | ICD-10-CM

## 2015-05-29 ENCOUNTER — Ambulatory Visit
Admission: RE | Admit: 2015-05-29 | Discharge: 2015-05-29 | Disposition: A | Discharge status: Home / Self Care | Payer: Commercial Managed Care - PPO | Source: Ambulatory Visit

## 2015-05-29 DIAGNOSIS — Z1231 Encounter for screening mammogram for malignant neoplasm of breast: Secondary | ICD-10-CM

## 2015-12-08 ENCOUNTER — Other Ambulatory Visit: Payer: Self-pay | Admitting: Cardiology

## 2016-02-09 ENCOUNTER — Ambulatory Visit (INDEPENDENT_AMBULATORY_CARE_PROVIDER_SITE_OTHER): Payer: Commercial Managed Care - PPO | Admitting: Cardiology

## 2016-02-09 ENCOUNTER — Encounter: Payer: Self-pay | Admitting: Cardiology

## 2016-02-09 VITALS — BP 120/78 | HR 54 | Ht 59.0 in | Wt 109.8 lb

## 2016-02-09 DIAGNOSIS — I10 Essential (primary) hypertension: Secondary | ICD-10-CM | POA: Diagnosis not present

## 2016-02-09 DIAGNOSIS — E785 Hyperlipidemia, unspecified: Secondary | ICD-10-CM | POA: Diagnosis not present

## 2016-02-09 DIAGNOSIS — R9431 Abnormal electrocardiogram [ECG] [EKG]: Secondary | ICD-10-CM | POA: Diagnosis not present

## 2016-02-09 DIAGNOSIS — IMO0001 Reserved for inherently not codable concepts without codable children: Secondary | ICD-10-CM

## 2016-02-09 DIAGNOSIS — R079 Chest pain, unspecified: Secondary | ICD-10-CM | POA: Diagnosis not present

## 2016-02-09 DIAGNOSIS — F17200 Nicotine dependence, unspecified, uncomplicated: Secondary | ICD-10-CM

## 2016-02-09 DIAGNOSIS — Z720 Tobacco use: Secondary | ICD-10-CM

## 2016-02-09 NOTE — Patient Instructions (Signed)

## 2016-02-09 NOTE — Progress Notes (Signed)
Patient ID: LYNSEY ANGE, female   DOB: 28-Nov-1950, 65 y.o.   MRN: [id number]    Patient Name: Melissa Waters Date of Encounter: 02/09/2016  Primary Care Provider:   Duane Lope, MD Primary Cardiologist:  Tobias Alexander, MD  Chief complain: chest pain  Problem List   Past Medical History  Diagnosis Date  . Hypertension   . Heart murmur     as a baby  . COPD (chronic obstructive pulmonary disease) (HCC)   . Osteoarthritis     right hip  . Diabetes mellitus without complication (HCC)     no meds   . Nephritis     Hx: of 1958  . Diverticulosis     Hx: of  . Osteoarthritis of left hip 08/13/2013   Past Surgical History  Procedure Laterality Date  . Total hip arthroplasty Right     05/27/2009  . Colonoscopy w/ polypectomy  2006  . Total hip arthroplasty Left 08/13/2013    Procedure: TOTAL HIP ARTHROPLASTY;  Surgeon: Eulas Post, MD;  Location: [facility] OR;  Service: Orthopedics;  Laterality: Left;   Allergies  Allergies  Allergen Reactions  . Amoxil [Amoxicillin] Diarrhea and Nausea And Vomiting   HPI  A delightful 65 year old female with h/o HTN, smoking, FH of CAD who was referred for a pre-operative evaluation for a hip replacement that was on 08/14/2013.  The patient tolerated surgery very well and has started rehabilitation. She denies any symptoms currently specifically she has no chest pain, shortness of breath, palpitations, dizziness or syncope. She is still actively smoking.  This is 6 months follow up. The patient is experiencing exertional chest pains that are relived by NTG. No palpitations or syncope. Dyspnea on moderate exertion. She continues to smoke. He father had CABG x 4.  11/26/2014 - the patient is coming after 6 months, at the last visit she was scheduled for a stress test for abnormal ECG and DOE. She has cancelled the appointment as she was anxious about it. She has been having the same symptoms along with exertional chest tightness. She continues  to smoke. No syncope, no palpitations, no LE edema.  02/09/2016 - 1 year follow-up, the patient remains active, denies any chest pain or shortness of breath. No palpitations dizziness or syncope. No claudications. She continues to smoke a few cigarettes at night. She is suffering from right is and hammertoe in her right lower extremity but hasn't had surgery and she is anxious about it. No lower extremity edema orthopnea or personal nocturnal dyspnea.    Home Medications  Prior to Admission medications   Medication Sig Start Date End Date Taking? Authorizing Provider  Calcium Carbonate-Vitamin D (CALCIUM 600 + D PO) Take 1 tablet by mouth daily.   Yes Historical Provider, MD  Carboxymethylcellul-Glycerin (CLEAR EYES FOR DRY EYES OP) Place 1 drop into both eyes 2 (two) times daily.   Yes Historical Provider, MD  Cinnamon 500 MG capsule Take 1,000 mg by mouth daily.   Yes Historical Provider, MD  clindamycin (CLEOCIN) 300 MG capsule Take 600 mg by mouth once as needed (prior to dental work).   Yes Historical Provider, MD  ibuprofen (ADVIL,MOTRIN) 200 MG tablet Take 400 mg by mouth 2 (two) times daily as needed for pain.   Yes Historical Provider, MD  MILK THISTLE PO Take 350 mg by mouth daily.   Yes Historical Provider, MD  quinapril (ACCUPRIL) 20 MG tablet Take 20 mg by mouth daily before breakfast.  Yes Historical Provider, MD  carvedilol (COREG) 3.125 MG tablet Take 1 tablet (3.125 mg total) by mouth 2 (two) times daily. 06/11/13   Melissa MassonKatarina H Gurbani Figge, MD    Family History  Father - CABG in his 4360' Mother - HTN Uncle - fatal MI in his 5660'  Social History  Social History   Social History  . Marital Status: Married    Spouse Name: N/A  . Number of Children: N/A  . Years of Education: N/A   Occupational History  . Not on file.   Social History Main Topics  . Smoking status: Current Every Day Smoker -- 0.20 packs/day for 44 years    Types: Cigarettes  . Smokeless tobacco: Never Used      Comment: 5 per day  . Alcohol Use: 0.0 oz/week    0 Standard drinks or equivalent per week     Comment: social  . Drug Use: No  . Sexual Activity: Not on file   Other Topics Concern  . Not on file   Social History Narrative     Review of Systems General:  No chills, fever, night sweats or weight changes.  Cardiovascular:  No chest pain, dyspnea on exertion, edema, orthopnea, palpitations, paroxysmal nocturnal dyspnea. Dermatological: No rash, lesions/masses Respiratory: No cough, dyspnea Urologic: No hematuria, dysuria Abdominal:   No nausea, vomiting, diarrhea, bright red blood per rectum, melena, or hematemesis Neurologic:  No visual changes, wkns, changes in mental status. All other systems reviewed and are otherwise negative except as noted above.  Physical Exam  Blood pressure 120/78, pulse 54, height 4\' 11"  (1.499 m), weight 109 lb 12.8 oz (49.805 kg).  General: Pleasant, NAD Psych: Normal affect. Neuro: Alert and oriented X 3. Moves all extremities spontaneously. HEENT: Normal  Neck: Supple without bruits or JVD. Lungs:  Resp regular and unlabored, CTA. Heart: RRR no s3, s4, or murmurs. Abdomen: Soft, non-tender, non-distended, BS + x 4.  Extremities: No clubbing, cyanosis or edema. DP/PT/Radials 2+ and equal bilaterally.  Accessory Clinical Findings  ECG - SR, HR 68/minute, PR 152, QRS 92, QT 418 ms, negative T waves in V3-5, nonspecific changes in I, II,III  Echocardiogram 11/14/2013  - Left ventricle: The cavity size was normal. Systolic function was normal. The estimated ejection fraction was in the range of 60% to 65%. Wall motion was normal; there were no regional wall motion abnormalities. Doppler parameters are consistent with abnormal left ventricular relaxation (grade 1 diastolic dysfunction). There was no evidence of elevated ventricular filling pressure by Doppler parameters. - Aortic valve: Trileaflet; normal thickness leaflets.  No regurgitation. - Aortic root: The aortic root was normal in size. - Mitral valve: Structurally normal valve. No regurgitation. - Left atrium: The atrium was normal in size. - Right ventricle: Systolic function was normal. - Tricuspid valve: Mild regurgitation. - Pulmonic valve: No regurgitation. - Pulmonary arteries: Systolic pressure was within the normal range.  Lexiscan nuclear stress test: 12/12/2014 Impression Exercise Capacity: Lexiscan with no exercise. BP Response: Normal blood pressure response. Clinical Symptoms: No significant symptoms noted. ECG Impression: No significant ST segment change suggestive of ischemia. Comparison with Prior Nuclear Study: No images to compare  Overall Impression: Low risk stress nuclear study . There is attenuation of the distal anterior wall but no evidence of ischemia. The contractility in that area is well preserved. .  LV Ejection Fraction: 51%. LV Wall Motion: NL LV Function; NL Wall Motion. Visually the LV function is greater than the 51% calculated.  EKG performed today 02/09/2016 shows sinus bradycardia with PACs otherwise nonspecific T-wave abnormalities unchanged from last year.   Assessment & Plan  65 year old female with h/o HTN, smoking, family h/o CAD, no known heart disease  1. Abnormal ECG, exertional chest pains - a Lexiscan nuclear stress test was negative for infarct or ischemia. Her chest pain has improved with Imdur 30 mg daily we will continue, we will also continue daily aspirin 81 mg po daily and 0.4 sl NTG PRN. She did need to use nitroglycerin in the last year, she is asymptomatic now no ischemic workup necessary at this point, will follow-up in one year.  2. Hypertension - controlled on current regimen  3. Lipids - followed by PCP. Her lipids in 2014 all at goal.  4. Smoking cessation - counseling provided, she continues to have a few at night.  Follow up in 1 year.  Melissa Masson,  MD 02/09/2016, 11:29 AM

## 2016-02-27 ENCOUNTER — Other Ambulatory Visit: Payer: Self-pay | Admitting: Cardiology

## 2016-03-06 ENCOUNTER — Other Ambulatory Visit: Payer: Self-pay | Admitting: Cardiology

## 2016-07-02 ENCOUNTER — Other Ambulatory Visit: Payer: Self-pay | Admitting: Obstetrics and Gynecology

## 2016-07-02 ENCOUNTER — Ambulatory Visit: Payer: Self-pay | Admitting: Surgery

## 2016-07-02 DIAGNOSIS — Z1231 Encounter for screening mammogram for malignant neoplasm of breast: Secondary | ICD-10-CM

## 2016-07-04 ENCOUNTER — Ambulatory Visit: Payer: Self-pay | Admitting: Surgery

## 2016-07-04 NOTE — H&P (Signed)
History of Present Illness Melissa Waters. Melissa Pember MD; 07/04/2016 4:10 PM) Patient words: GB.  The patient is a 65 year old female who presents with an inguinal hernia. Referred by Dr. Beverley Fiedler for left inguinal hernia  This is a 65 yo female who presents with a two week history of a left inguinal bulge. The patient does not remember any exacerbating event, but has experienced some discomfort in this area. The patient smokes about a half ppd, but denies any chronic cough. She denies any obstructive symptoms. She comes in today for surgical evaluation.   Other Problems (Melissa Eversole, LPN; 01/10/8118 14:78 AM) Arthritis Back Pain Chronic Obstructive Lung Disease Diabetes Mellitus High blood pressure Inguinal Hernia  Past Surgical History (Melissa Eversole, LPN; 2/95/6213 08:65 AM) Hip Surgery Bilateral.  Diagnostic Studies History (Melissa Eversole, LPN; 7/84/6962 95:28 AM) Colonoscopy 5-10 years ago Mammogram within last year Pap Smear 1-5 years ago  Allergies (Melissa Eversole, LPN; 01/15/2439 10:27 AM) AMOXICILLIN  Medication History (Melissa Eversole, LPN; 2/53/6644 03:47 AM) Carvedilol (3.125MG  Tablet, Oral) Active. Clindamycin HCl (300MG  Capsule, Oral) Active. Isosorbide Mononitrate ER (30MG  Tablet ER 24HR, Oral) Active. Quinapril HCl (20MG  Tablet, Oral) Active. Aspirin (81MG  Tablet Chewable, Oral) Active. Calcium 600 (1500 (600 Ca)MG Tablet, Oral) Active. Clear Eyes (0.012% Solution, Ophthalmic) Active. Ibuprofen (200MG  Capsule, Oral) Active. Milk Thistle (300MG  Capsule, Oral) Active. Nitroglycerin (0.3MG  Tab Sublingual, Sublingual) Active. Vitamin C (100MG  Tablet, Oral) Active. Medications Reconciled  Social History (Melissa Eversole, LPN; 01/26/9562 87:56 AM) Alcohol use Moderate alcohol use. Caffeine use Carbonated beverages, Coffee, Tea. Illicit drug use Remotely quit drug use. Tobacco use Current every day smoker.  Family History Melissa Pilling, LPN; 4/33/2951 88:41 AM) Breast Cancer Mother. Diabetes Mellitus Mother. Heart Disease Family Members In General, Father. Heart disease in female family member before age 55 Hypertension Mother. Ovarian Cancer Family Members In General.  Pregnancy / Birth History Melissa Pilling, LPN; 6/60/6301 60:10 AM) Age at menarche 13 years. Age of menopause 76-55 Gravida 0 Para 0     Review of Systems (Melissa Eversole LPN; 9/32/3557 32:20 AM) General Not Present- Appetite Loss, Chills, Fatigue, Fever, Night Sweats, Weight Gain and Weight Loss. Skin Not Present- Change in Wart/Mole, Dryness, Hives, Jaundice, New Lesions, Non-Healing Wounds, Rash and Ulcer. HEENT Present- Wears glasses/contact lenses. Not Present- Earache, Hearing Loss, Hoarseness, Nose Bleed, Oral Ulcers, Ringing in the Ears, Seasonal Allergies, Sinus Pain, Sore Throat, Visual Disturbances and Yellow Eyes. Respiratory Not Present- Bloody sputum, Chronic Cough, Difficulty Breathing, Snoring and Wheezing. Breast Not Present- Breast Mass, Breast Pain, Nipple Discharge and Skin Changes. Cardiovascular Not Present- Chest Pain, Difficulty Breathing Lying Down, Leg Cramps, Palpitations, Rapid Heart Rate, Shortness of Breath and Swelling of Extremities. Gastrointestinal Present- Bloating and Gets full quickly at meals. Not Present- Abdominal Pain, Bloody Stool, Change in Bowel Habits, Chronic diarrhea, Constipation, Difficulty Swallowing, Excessive gas, Hemorrhoids, Indigestion, Nausea, Rectal Pain and Vomiting. Female Genitourinary Not Present- Frequency, Nocturia, Painful Urination, Pelvic Pain and Urgency. Musculoskeletal Not Present- Back Pain, Joint Pain, Joint Stiffness, Muscle Pain, Muscle Weakness and Swelling of Extremities. Neurological Present- Trouble walking. Not Present- Decreased Memory, Fainting, Headaches, Numbness, Seizures, Tingling, Tremor and Weakness. Psychiatric Not Present- Anxiety, Bipolar, Change  in Sleep Pattern, Depression, Fearful and Frequent crying. Endocrine Present- Hair Changes. Not Present- Cold Intolerance, Excessive Hunger, Heat Intolerance, Hot flashes and New Diabetes. Hematology Present- Blood Thinners. Not Present- Easy Bruising, Excessive bleeding, Gland problems, HIV and Persistent Infections.  Vitals (Melissa Eversole LPN; 2/54/2706 23:76 AM) 07/02/2016 11:26 AM  Weight: 108 lb Height: 59.5in Body Surface Area: 1.43 m Body Mass Index: 21.45 kg/m  Temp.: 97.105F(Oral)  Pulse: 65 (Regular)  BP: 132/70 (Sitting, Left Arm, Standard)      Physical Exam Melissa Waters(Melissa Bennick K. Dyamond Tolosa MD; 07/04/2016 4:12 PM)  The physical exam findings are as follows: Note:WDWN in NAD Eyes: Pupils equal, round; sclera anicteric HENT: Oral mucosa moist; good dentition Neck: No masses palpated, no thyromegaly Lungs: CTA bilaterally; normal respiratory effort CV: Regular rate and rhythm; no murmurs; extremities well-perfused with no edema Abd: +bowel sounds, soft, non-tender, no palpable organomegaly; no umbilical hernia Small spontaneously reducible left inguinal hernia - minimal tenderness No sign of right inguinal hernia Skin: Warm, dry; no sign of jaundice Psychiatric - alert and oriented x 4; calm mood and affect    Assessment & Plan Melissa Waters(Melissa Nishida K. Shawnee Gambone MD; 07/04/2016 4:06 PM)  INGUINAL HERNIA OF LEFT SIDE WITHOUT OBSTRUCTION OR GANGRENE (K40.90)  Current Plans Schedule for Surgery - left inguinal hernia repair with mesh. The surgical procedure has been discussed with the patient. Potential risks, benefits, alternative treatments, and expected outcomes have been explained. All of the patient's questions at this time have been answered. The likelihood of reaching the patient's treatment goal is good. The patient understand the proposed surgical procedure and wishes to proceed.  Melissa ArmsMatthew K. Corliss Skainssuei, MD, [facility]  General/ Trauma Surgery  07/04/2016 4:12 PM

## 2016-07-22 ENCOUNTER — Ambulatory Visit
Admission: RE | Admit: 2016-07-22 | Discharge: 2016-07-22 | Disposition: A | Discharge status: Home / Self Care | Payer: Commercial Managed Care - PPO | Source: Ambulatory Visit | Attending: Obstetrics and Gynecology | Admitting: Obstetrics and Gynecology

## 2016-07-22 DIAGNOSIS — Z1231 Encounter for screening mammogram for malignant neoplasm of breast: Secondary | ICD-10-CM

## 2017-02-02 ENCOUNTER — Other Ambulatory Visit: Payer: Self-pay | Admitting: Cardiology

## 2017-03-05 ENCOUNTER — Other Ambulatory Visit: Payer: Self-pay | Admitting: Cardiology

## 2017-03-06 ENCOUNTER — Other Ambulatory Visit: Payer: Self-pay | Admitting: Cardiology

## 2017-03-28 ENCOUNTER — Ambulatory Visit: Payer: Self-pay | Admitting: Surgery

## 2017-03-28 NOTE — H&P (Signed)
History of Present Illness Patient words: hernia.  The patient is a 66 year old female who presents with an inguinal hernia. Referred by Dr. Beverley Fiedler for left inguinal hernia  This is a 66 yo female who presents with a 10 month history of a left inguinal bulge. The patient does not remember any exacerbating event, but has experienced some discomfort in this area. The patient smokes about a half ppd, but denies any chronic cough. She denies any obstructive symptoms. She comes in today for surgical evaluation.  She was seen in September and surgery was recommended, but she declined to schedule.  The bulge is slightly enlarged.     Other Problems  Arthritis Back Pain Chronic Obstructive Lung Disease Diabetes Mellitus High blood pressure Inguinal Hernia  Past Surgical History  Hip Surgery Bilateral.  Diagnostic Studies History  Colonoscopy 5-10 years ago Mammogram within last year Pap Smear 1-5 years ago  Allergies AMOXICILLIN  Medication History  Carvedilol (3.125MG  Tablet, Oral) Active. Clindamycin HCl (300MG  Capsule, Oral) Active. Isosorbide Mononitrate ER (30MG  Tablet ER 24HR, Oral) Active. Quinapril HCl (20MG  Tablet, Oral) Active. Aspirin (81MG  Tablet Chewable, Oral) Active. Calcium 600 (1500 (600 Ca)MG Tablet, Oral) Active. Clear Eyes (0.012% Solution, Ophthalmic) Active. Ibuprofen (200MG  Capsule, Oral) Active. Milk Thistle (300MG  Capsule, Oral) Active. Nitroglycerin (0.3MG  Tab Sublingual, Sublingual) Active. Vitamin C (100MG  Tablet, Oral) Active. Medications Reconciled  Social History Alcohol use Moderate alcohol use. Caffeine use Carbonated beverages, Coffee, Tea. Illicit drug use Remotely quit drug use. Tobacco use Current every day smoker.  Family History  Breast Cancer Mother. Diabetes Mellitus Mother. Heart Disease Family Members In General, Father. Heart disease in female family member before age  66 Hypertension Mother. Ovarian Cancer Family Members In General.  Pregnancy / Birth History  Age at menarche 13 years. Age of menopause 31-55 Gravida 0 Para 0     Review of Systems  General Not Present- Appetite Loss, Chills, Fatigue, Fever, Night Sweats, Weight Gain and Weight Loss. Skin Not Present- Change in Wart/Mole, Dryness, Hives, Jaundice, New Lesions, Non-Healing Wounds, Rash and Ulcer. HEENT Present- Wears glasses/contact lenses. Not Present- Earache, Hearing Loss, Hoarseness, Nose Bleed, Oral Ulcers, Ringing in the Ears, Seasonal Allergies, Sinus Pain, Sore Throat, Visual Disturbances and Yellow Eyes. Respiratory Not Present- Bloody sputum, Chronic Cough, Difficulty Breathing, Snoring and Wheezing. Breast Not Present- Breast Mass, Breast Pain, Nipple Discharge and Skin Changes. Cardiovascular Not Present- Chest Pain, Difficulty Breathing Lying Down, Leg Cramps, Palpitations, Rapid Heart Rate, Shortness of Breath and Swelling of Extremities. Gastrointestinal Present- Bloating and Gets full quickly at meals. Not Present- Abdominal Pain, Bloody Stool, Change in Bowel Habits, Chronic diarrhea, Constipation, Difficulty Swallowing, Excessive gas, Hemorrhoids, Indigestion, Nausea, Rectal Pain and Vomiting. Female Genitourinary Not Present- Frequency, Nocturia, Painful Urination, Pelvic Pain and Urgency. Musculoskeletal Not Present- Back Pain, Joint Pain, Joint Stiffness, Muscle Pain, Muscle Weakness and Swelling of Extremities. Neurological Present- Trouble walking. Not Present- Decreased Memory, Fainting, Headaches, Numbness, Seizures, Tingling, Tremor and Weakness. Psychiatric Not Present- Anxiety, Bipolar, Change in Sleep Pattern, Depression, Fearful and Frequent crying. Endocrine Present- Hair Changes. Not Present- Cold Intolerance, Excessive Hunger, Heat Intolerance, Hot flashes and New Diabetes. Hematology Present- Blood Thinners. Not Present- Easy Bruising, Excessive  bleeding, Gland problems, HIV and Persistent Infections.  Vitals  Weight: 108 lb Height: 59.5in Body Surface Area: 1.43 m Body Mass Index: 21.45 kg/m  Temp.: 97.78F(Oral)  Pulse: 65 (Regular)  BP: 132/70 (Sitting, Left Arm, Standard)      Physical Exam  The  physical exam findings are as follows: Note:WDWN in NAD Eyes: Pupils equal, round; sclera anicteric HENT: Oral mucosa moist; good dentition Neck: No masses palpated, no thyromegaly Lungs: CTA bilaterally; normal respiratory effort CV: Regular rate and rhythm; no murmurs; extremities well-perfused with no edema Abd: +bowel sounds, soft, non-tender, no palpable organomegaly; no umbilical hernia  Moderate-sized spontaneously reducible left inguinal hernia - minimal tenderness No sign of right inguinal hernia Skin: Warm, dry; no sign of jaundice Psychiatric - alert and oriented x 4; calm mood and affect    Assessment & Plan  INGUINAL HERNIA OF LEFT SIDE WITHOUT OBSTRUCTION OR GANGRENE (K40.90)  Current Plans Schedule for Surgery - left inguinal hernia repair with mesh. The surgical procedure has been discussed with the patient. Potential risks, benefits, alternative treatments, and expected outcomes have been explained. All of the patient's questions at this time have been answered. The likelihood of reaching the patient's treatment goal is good. The patient understand the proposed surgical procedure and wishes to proceed.   Wilmon ArmsMatthew K. Corliss Skainssuei, MD, [facility]  General/ Trauma Surgery  03/28/2017 1:01 PM

## 2017-05-30 ENCOUNTER — Ambulatory Visit (INDEPENDENT_AMBULATORY_CARE_PROVIDER_SITE_OTHER): Payer: Commercial Managed Care - PPO | Admitting: Cardiology

## 2017-05-30 ENCOUNTER — Encounter (INDEPENDENT_AMBULATORY_CARE_PROVIDER_SITE_OTHER): Payer: Self-pay

## 2017-05-30 ENCOUNTER — Encounter: Payer: Self-pay | Admitting: Cardiology

## 2017-05-30 VITALS — BP 122/74 | HR 61 | Ht 59.0 in | Wt 109.0 lb

## 2017-05-30 DIAGNOSIS — I10 Essential (primary) hypertension: Secondary | ICD-10-CM

## 2017-05-30 DIAGNOSIS — R9431 Abnormal electrocardiogram [ECG] [EKG]: Secondary | ICD-10-CM | POA: Diagnosis not present

## 2017-05-30 DIAGNOSIS — F17200 Nicotine dependence, unspecified, uncomplicated: Secondary | ICD-10-CM | POA: Diagnosis not present

## 2017-05-30 MED ORDER — NITROGLYCERIN 0.4 MG SL SUBL: 0.4000 mg | SUBLINGUAL_TABLET | SUBLINGUAL | 3 refills | Status: DC | PRN

## 2017-05-30 NOTE — Patient Instructions (Signed)
Your physician recommends that you continue on your current medications as directed. Please refer to the Current Medication list given to you today.     Your physician wants you to follow-up in: 6 MONTHS WITH DR NELSON You will receive a reminder letter in the mail two months in advance. If you don't receive a letter, please call our office to schedule the follow-up appointment.  

## 2017-05-30 NOTE — Progress Notes (Signed)
Patient ID: CHAITRA MAST, female   DOB: [birth year], 66 y.o.   MRN: [id number]    Patient Name: Melissa Waters Date of Encounter: 05/30/2017  Primary Care Provider:  Daisy Floro, MD Primary Cardiologist:  Tobias Alexander, MD  Chief complain: chest pain  Problem List   Past Medical History:  Diagnosis Date  . COPD (chronic obstructive pulmonary disease) (HCC)   . Diabetes mellitus without complication (HCC)    no meds   . Diverticulosis    Hx: of  . Heart murmur    as a baby  . Hypertension   . Nephritis    Hx: of 1958  . Osteoarthritis    right hip  . Osteoarthritis of left hip 08/13/2013   Past Surgical History:  Procedure Laterality Date  . COLONOSCOPY W/ POLYPECTOMY  2006  . TOTAL HIP ARTHROPLASTY Right    05/27/2009  . TOTAL HIP ARTHROPLASTY Left 08/13/2013   Procedure: TOTAL HIP ARTHROPLASTY;  Surgeon: Eulas Post, MD;  Location: [facility] OR;  Service: Orthopedics;  Laterality: Left;   Allergies  Allergies  Allergen Reactions  . Amoxil [Amoxicillin] Diarrhea and Nausea And Vomiting   HPI  A delightful 66 year old female with h/o HTN, smoking, FH of CAD who was referred for a pre-operative evaluation for a hip replacement that was on 08/14/2013.  The patient tolerated surgery very well and has started rehabilitation. She denies any symptoms currently specifically she has no chest pain, shortness of breath, palpitations, dizziness or syncope. She is still actively smoking.  This is 6 months follow up. The patient is experiencing exertional chest pains that are relived by NTG. No palpitations or syncope. Dyspnea on moderate exertion. She continues to smoke. He father had CABG x 4.  11/26/2014 - the patient is coming after 6 months, at the last visit she was scheduled for a stress test for abnormal ECG and DOE. She has cancelled the appointment as she was anxious about it. She has been having the same symptoms along with exertional chest tightness. She  continues to smoke. No syncope, no palpitations, no LE edema.  02/09/2016 - 1 year follow-up, the patient remains active, denies any chest pain or shortness of breath. No palpitations dizziness or syncope. No claudications. She continues to smoke a few cigarettes at night. She is suffering from right is and hammertoe in her right lower extremity but hasn't had surgery and she is anxious about it. No lower extremity edema orthopnea or personal nocturnal dyspnea.  05/30/2017 - 1 year follow-up, the patient states that she continues to have dyspnea on moderate exertion but she feels strongly that it hasn't changed since the last year. She continues to smoke a few cigarettes a day. She denies any palpitations or claudications no dizziness or syncope. She only had one episode of chest pain while at rest this morning. She has to take sublingual nitroglycerin a few times a year with good response with chest pain.   Home Medications  Prior to Admission medications   Medication Sig Start Date End Date Taking? Authorizing Provider  Calcium Carbonate-Vitamin D (CALCIUM 600 + D PO) Take 1 tablet by mouth daily.   Yes Historical Provider, MD  Carboxymethylcellul-Glycerin (CLEAR EYES FOR DRY EYES OP) Place 1 drop into both eyes 2 (two) times daily.   Yes Historical Provider, MD  Cinnamon 500 MG capsule Take 1,000 mg by mouth daily.   Yes Historical Provider, MD  clindamycin (CLEOCIN) 300 MG capsule Take 600 mg by  mouth once as needed (prior to dental work).   Yes Historical Provider, MD  ibuprofen (ADVIL,MOTRIN) 200 MG tablet Take 400 mg by mouth 2 (two) times daily as needed for pain.   Yes Historical Provider, MD  MILK THISTLE PO Take 350 mg by mouth daily.   Yes Historical Provider, MD  quinapril (ACCUPRIL) 20 MG tablet Take 20 mg by mouth daily before breakfast.    Yes Historical Provider, MD  carvedilol (COREG) 3.125 MG tablet Take 1 tablet (3.125 mg total) by mouth 2 (two) times daily. 06/11/13   Lars Masson, MD    Family History  Father - CABG in his 64' Mother - HTN Uncle - fatal MI in his 51'  Social History  Social History   Social History  . Marital status: Married    Spouse name: N/A  . Number of children: N/A  . Years of education: N/A   Occupational History  . Not on file.   Social History Main Topics  . Smoking status: Current Every Day Smoker    Packs/day: 0.20    Years: 44.00    Types: Cigarettes  . Smokeless tobacco: Never Used     Comment: 5 per day  . Alcohol use 0.0 oz/week     Comment: social  . Drug use: No  . Sexual activity: Not on file   Other Topics Concern  . Not on file   Social History Narrative  . No narrative on file     Review of Systems General:  No chills, fever, night sweats or weight changes.  Cardiovascular:  No chest pain, dyspnea on exertion, edema, orthopnea, palpitations, paroxysmal nocturnal dyspnea. Dermatological: No rash, lesions/masses Respiratory: No cough, dyspnea Urologic: No hematuria, dysuria Abdominal:   No nausea, vomiting, diarrhea, bright red blood per rectum, melena, or hematemesis Neurologic:  No visual changes, wkns, changes in mental status. All other systems reviewed and are otherwise negative except as noted above.  Physical Exam  Blood pressure 122/74, pulse 61, height 4\' 11"  (1.499 m), weight 109 lb (49.4 kg).  General: Pleasant, NAD Psych: Normal affect. Neuro: Alert and oriented X 3. Moves all extremities spontaneously. HEENT: Normal  Neck: Supple without bruits or JVD. Lungs:  Resp regular and unlabored, CTA. Heart: RRR no s3, s4, or murmurs. Abdomen: Soft, non-tender, non-distended, BS + x 4.  Extremities: No clubbing, cyanosis or edema. DP/PT/Radials 2+ and equal bilaterally.  Accessory Clinical Findings  ECG - SR, HR 68/minute, PR 152, QRS 92, QT 418 ms, negative T waves in V3-5, nonspecific changes in I, II,III  Echocardiogram 11/14/2013  - Left ventricle: The cavity size was  normal. Systolic function was normal. The estimated ejection fraction was in the range of 60% to 65%. Wall motion was normal; there were no regional wall motion abnormalities. Doppler parameters are consistent with abnormal left ventricular relaxation (grade 1 diastolic dysfunction). There was no evidence of elevated ventricular filling pressure by Doppler parameters. - Aortic valve: Trileaflet; normal thickness leaflets. No regurgitation. - Aortic root: The aortic root was normal in size. - Mitral valve: Structurally normal valve. No regurgitation. - Left atrium: The atrium was normal in size. - Right ventricle: Systolic function was normal. - Tricuspid valve: Mild regurgitation. - Pulmonic valve: No regurgitation. - Pulmonary arteries: Systolic pressure was within the normal range.  Lexiscan nuclear stress test: 12/12/2014 Impression Exercise Capacity: Lexiscan with no exercise. BP Response: Normal blood pressure response. Clinical Symptoms: No significant symptoms noted. ECG Impression: No significant ST segment  change suggestive of ischemia. Comparison with Prior Nuclear Study: No images to compare  Overall Impression: Low risk stress nuclear study . There is attenuation of the distal anterior wall but no evidence of ischemia. The contractility in that area is well preserved. .  LV Ejection Fraction: 51%. LV Wall Motion: NL LV Function; NL Wall Motion. Visually the LV function is greater than the 51% calculated.   EKG performed today 05/30/2017 shows sinus rhythm, negative T waves in inferior leads suspicious for inferior ischemia that are new. This was personally reviewed.  Assessment & Plan  66 year old female with h/o HTN, smoking, family h/o CAD, no known heart disease  1. Abnormal ECG, exertional chest pains - a Lexiscan nuclear stress test was negative for infarct or ischemia. Her chest pain has improved with Imdur 30 mg daily we will continue, we will also  continue daily aspirin 81 mg po daily and 0.4 sl NTG PRN. She used nitroglycerin a few times this year, she is symptomatic but feels that it stable, her EKG shows slightly more prominent negative T waves in inferior leads suspicious for this year ischemia. She is all for coronary CTA for further evaluation, however she wants to sing about it.  2. Hypertension - controlled on current regimen  3. Lipids - followed by PCP. Her lipids in March 2018 showed LDL 73, HDL 97, triglycerides 51. Hemoglobin A1c 6.7, creatinine 0.5.  4. Smoking cessation - counseling provided, she continues to have a few at night.  Follow up in 6 months.  Tobias Alexander, MD 05/30/2017, 10:07 AM

## 2017-05-31 ENCOUNTER — Other Ambulatory Visit: Payer: Self-pay | Admitting: Cardiology

## 2017-06-21 ENCOUNTER — Other Ambulatory Visit: Payer: Self-pay | Admitting: Cardiology

## 2017-07-13 ENCOUNTER — Other Ambulatory Visit: Payer: Self-pay | Admitting: Obstetrics and Gynecology

## 2017-07-13 DIAGNOSIS — Z1231 Encounter for screening mammogram for malignant neoplasm of breast: Secondary | ICD-10-CM

## 2017-08-03 ENCOUNTER — Ambulatory Visit
Admission: RE | Admit: 2017-08-03 | Discharge: 2017-08-03 | Disposition: A | Discharge status: Home / Self Care | Payer: Commercial Managed Care - PPO | Source: Ambulatory Visit | Attending: Physician Assistant | Admitting: Physician Assistant

## 2017-08-03 DIAGNOSIS — Z1231 Encounter for screening mammogram for malignant neoplasm of breast: Secondary | ICD-10-CM

## 2018-01-06 ENCOUNTER — Other Ambulatory Visit: Payer: Self-pay | Admitting: Acute Care

## 2018-01-06 DIAGNOSIS — F17210 Nicotine dependence, cigarettes, uncomplicated: Secondary | ICD-10-CM

## 2018-01-06 DIAGNOSIS — Z122 Encounter for screening for malignant neoplasm of respiratory organs: Secondary | ICD-10-CM

## 2018-01-25 ENCOUNTER — Ambulatory Visit (INDEPENDENT_AMBULATORY_CARE_PROVIDER_SITE_OTHER): Payer: Commercial Managed Care - PPO | Admitting: Acute Care

## 2018-01-25 ENCOUNTER — Ambulatory Visit (INDEPENDENT_AMBULATORY_CARE_PROVIDER_SITE_OTHER)
Admission: RE | Admit: 2018-01-25 | Discharge: 2018-01-25 | Disposition: A | Discharge status: Home / Self Care | Payer: Commercial Managed Care - PPO | Source: Ambulatory Visit | Attending: Acute Care | Admitting: Acute Care

## 2018-01-25 ENCOUNTER — Encounter: Payer: Self-pay | Admitting: Acute Care

## 2018-01-25 DIAGNOSIS — Z122 Encounter for screening for malignant neoplasm of respiratory organs: Secondary | ICD-10-CM

## 2018-01-25 DIAGNOSIS — F17210 Nicotine dependence, cigarettes, uncomplicated: Secondary | ICD-10-CM

## 2018-01-25 NOTE — Progress Notes (Signed)
Shared Decision Making Visit Lung Cancer Screening Program [phone]([id number])   Eligibility:  Age 67 y.o.  Pack Years Smoking History Calculation 55 pack years (# packs/per year x # years smoked)  Recent History of coughing up blood  no  Unexplained weight loss? no ( >Than 15 pounds within the last 6 months )  Prior History Lung / other cancer no (Diagnosis within the last 5 years already requiring surveillance chest CT Scans).  Smoking Status Current Smoker  Former Smokers: Years since quit: NA  Quit Date: NA  Visit Components:  Discussion included one or more decision making aids. yes  Discussion included risk/benefits of screening. yes  Discussion included potential follow up diagnostic testing for abnormal scans. yes  Discussion included meaning and risk of over diagnosis. yes  Discussion included meaning and risk of False Positives. yes  Discussion included meaning of total radiation exposure. yes  Counseling Included:  Importance of adherence to annual lung cancer LDCT screening. yes  Impact of comorbidities on ability to participate in the program. yes  Ability and willingness to under diagnostic treatment. yes  Smoking Cessation Counseling:  Current Smokers:   Discussed importance of smoking cessation. yes  Information about tobacco cessation classes and interventions provided to patient. yes  Patient provided with "ticket" for LDCT Scan. yes  Symptomatic Patient. no  Counseling  Diagnosis Code: Tobacco Use Z72.0  Asymptomatic Patient yes  Counseling (Intermediate counseling: > three minutes counseling) [id number]  Former Smokers:   Discussed the importance of maintaining cigarette abstinence. yes  Diagnosis Code: Personal History of Nicotine Dependence. U98.119Z87.891  Information about tobacco cessation classes and interventions provided to patient. Yes  Patient provided with "ticket" for LDCT Scan. yes  Written Order for Lung Cancer Screening with LDCT  placed in Epic. Yes (CT Chest Lung Cancer Screening Low Dose W/O CM) [id number] Z12.2-Screening of respiratory organs Z87.891-Personal history of nicotine dependence  I have spent 25 minutes of face to face time with [name] discussing the risks and benefits of lung cancer screening. We viewed a power point together that explained in detail the above noted topics. We paused at intervals to allow for questions to be asked and answered to ensure understanding.We discussed that the single most powerful action that she can take to decrease her risk of developing lung cancer is to quit smoking. We discussed whether or not she is ready to commit to setting a quit date. We discussed options for tools to aid in quitting smoking including nicotine replacement therapy, non-nicotine medications, support groups, Quit Smart classes, and behavior modification. We discussed that often times setting smaller, more achievable goals, such as eliminating 1 cigarette a day for a week and then 2 cigarettes a day for a week can be helpful in slowly decreasing the number of cigarettes smoked. This allows for a sense of accomplishment as well as providing a clinical benefit. I gave her the " Be Stronger Than Your Excuses" card with contact information for community resources, classes, free nicotine replacement therapy, and access to mobile apps, text messaging, and on-line smoking cessation help. I have also given her my card and contact information in the event she needs to contact me. We discussed the time and location of the scan, and that either Melissa Miyamotoenise Phelps RN or I will call with the results within 24-48 hours of receiving them. I have offered her  a copy of the power point we viewed  as a resource in the event they need reinforcement of the concepts  we discussed today in the office. The patient verbalized understanding of all of  the above and had no further questions upon leaving the office. They have my contact information in  the event they have any further questions.  I spent 4 minutes counseling on smoking cessation and the health risks of continued tobacco abuse.  I explained to the patient that there has been a high incidence of coronary artery disease noted on these exams. I explained that this is a non-gated exam therefore degree or severity cannot be determined. This patient is not on statin therapy. I have asked the patient to follow-up with their PCP regarding any incidental finding of coronary artery disease and management with diet or medication as their PCP  feels is clinically indicated. The patient verbalized understanding of the above and had no further questions upon completion of the visit.        Bevelyn Ngo, NP 01/25/2018 11:34 AM

## 2018-01-31 ENCOUNTER — Other Ambulatory Visit: Payer: Self-pay | Admitting: Acute Care

## 2018-01-31 DIAGNOSIS — F17210 Nicotine dependence, cigarettes, uncomplicated: Secondary | ICD-10-CM

## 2018-01-31 DIAGNOSIS — Z122 Encounter for screening for malignant neoplasm of respiratory organs: Secondary | ICD-10-CM

## 2018-02-06 ENCOUNTER — Encounter: Payer: Self-pay | Admitting: Cardiology

## 2018-02-06 ENCOUNTER — Ambulatory Visit: Payer: Commercial Managed Care - PPO | Admitting: Cardiology

## 2018-02-06 VITALS — BP 112/80 | HR 60 | Ht 59.0 in | Wt 107.0 lb

## 2018-02-06 DIAGNOSIS — I2510 Atherosclerotic heart disease of native coronary artery without angina pectoris: Secondary | ICD-10-CM | POA: Diagnosis not present

## 2018-02-06 DIAGNOSIS — F17200 Nicotine dependence, unspecified, uncomplicated: Secondary | ICD-10-CM | POA: Diagnosis not present

## 2018-02-06 DIAGNOSIS — I10 Essential (primary) hypertension: Secondary | ICD-10-CM

## 2018-02-06 DIAGNOSIS — R0609 Other forms of dyspnea: Secondary | ICD-10-CM

## 2018-02-06 DIAGNOSIS — R0600 Dyspnea, unspecified: Secondary | ICD-10-CM

## 2018-02-06 NOTE — Progress Notes (Signed)
Patient ID: Melissa Waters, female   DOB: December 23, 1950, 67 y.o.   MRN: [id number]    Patient Name: Melissa Waters Date of Encounter: 02/06/2018  Primary Care Provider:  Daisy Floro, MD Primary Cardiologist:  Tobias Alexander, MD  Chief complain: chest pain  Problem List   Past Medical History:  Diagnosis Date  . COPD (chronic obstructive pulmonary disease) (HCC)   . Diabetes mellitus without complication (HCC)    no meds   . Diverticulosis    Hx: of  . Heart murmur    as a baby  . Hypertension   . Nephritis    Hx: of 1958  . Osteoarthritis    right hip  . Osteoarthritis of left hip 08/13/2013   Past Surgical History:  Procedure Laterality Date  . COLONOSCOPY W/ POLYPECTOMY  2006  . TOTAL HIP ARTHROPLASTY Right    05/27/2009  . TOTAL HIP ARTHROPLASTY Left 08/13/2013   Procedure: TOTAL HIP ARTHROPLASTY;  Surgeon: Eulas Post, MD;  Location: [facility] OR;  Service: Orthopedics;  Laterality: Left;   Allergies  Allergies  Allergen Reactions  . Amoxil [Amoxicillin] Diarrhea and Nausea And Vomiting   HPI  A delightful 67 year old female with h/o HTN, smoking, FH of CAD who was referred for a pre-operative evaluation for a hip replacement that was on 08/14/2013.  The patient tolerated surgery very well and has started rehabilitation. She denies any symptoms currently specifically she has no chest pain, shortness of breath, palpitations, dizziness or syncope. She is still actively smoking.  This is 6 months follow up. The patient is experiencing exertional chest pains that are relived by NTG. No palpitations or syncope. Dyspnea on moderate exertion. She continues to smoke. He father had CABG x 4.  11/26/2014 - the patient is coming after 6 months, at the last visit she was scheduled for a stress test for abnormal ECG and DOE. She has cancelled the appointment as she was anxious about it. She has been having the same symptoms along with exertional chest tightness. She continues  to smoke. No syncope, no palpitations, no LE edema.  02/09/2016 - 1 year follow-up, the patient remains active, denies any chest pain or shortness of breath. No palpitations dizziness or syncope. No claudications. She continues to smoke a few cigarettes at night. She is suffering from right is and hammertoe in her right lower extremity but hasn't had surgery and she is anxious about it. No lower extremity edema orthopnea or personal nocturnal dyspnea.  05/30/2017 - 1 year follow-up, the patient states that she continues to have dyspnea on moderate exertion but she feels strongly that it hasn't changed since the last year. She continues to smoke a few cigarettes a day. She denies any palpitations or claudications no dizziness or syncope. She only had one episode of chest pain while at rest this morning. She has to take sublingual nitroglycerin a few times a year with good response with chest pain.  02/06/2018 - 6 months follow up, she is doing well, denies CP, has stable DOE, continues to smoke and is not motivated to quit. She just underwent her annual chest CT with Lung-RADS 1, negative. Continue annual screening with low-dose chest CT without contrast in 12 months. There are diffuse coronary calcifications. She complains of daily headaches.    Home Medications  Prior to Admission medications   Medication Sig Start Date End Date Taking? Authorizing Provider  Calcium Carbonate-Vitamin D (CALCIUM 600 + D PO) Take 1 tablet by  mouth daily.   Yes Historical Provider, MD  Carboxymethylcellul-Glycerin (CLEAR EYES FOR DRY EYES OP) Place 1 drop into both eyes 2 (two) times daily.   Yes Historical Provider, MD  Cinnamon 500 MG capsule Take 1,000 mg by mouth daily.   Yes Historical Provider, MD  clindamycin (CLEOCIN) 300 MG capsule Take 600 mg by mouth once as needed (prior to dental work).   Yes Historical Provider, MD  ibuprofen (ADVIL,MOTRIN) 200 MG tablet Take 400 mg by mouth 2 (two) times daily as needed for  pain.   Yes Historical Provider, MD  MILK THISTLE PO Take 350 mg by mouth daily.   Yes Historical Provider, MD  quinapril (ACCUPRIL) 20 MG tablet Take 20 mg by mouth daily before breakfast.    Yes Historical Provider, MD  carvedilol (COREG) 3.125 MG tablet Take 1 tablet (3.125 mg total) by mouth 2 (two) times daily. 06/11/13   Lars Masson, MD    Family History  Father - CABG in his 23' Mother - HTN Uncle - fatal MI in his 6'  Social History  Social History   Socioeconomic History  . Marital status: Married    Spouse name: Not on file  . Number of children: Not on file  . Years of education: Not on file  . Highest education level: Not on file  Occupational History  . Not on file  Social Needs  . Financial resource strain: Not on file  . Food insecurity:    Worry: Not on file    Inability: Not on file  . Transportation needs:    Medical: Not on file    Non-medical: Not on file  Tobacco Use  . Smoking status: Current Every Day Smoker    Packs/day: 1.25    Years: 49.00    Pack years: 61.25    Types: Cigarettes  . Smokeless tobacco: Never Used  . Tobacco comment: 5 per day. Smoked 1/2 to 2 ppd x 49 years  Substance and Sexual Activity  . Alcohol use: Yes    Alcohol/week: 0.0 oz    Comment: social  . Drug use: No  . Sexual activity: Not on file  Lifestyle  . Physical activity:    Days per week: Not on file    Minutes per session: Not on file  . Stress: Not on file  Relationships  . Social connections:    Talks on phone: Not on file    Gets together: Not on file    Attends religious service: Not on file    Active member of club or organization: Not on file    Attends meetings of clubs or organizations: Not on file    Relationship status: Not on file  . Intimate partner violence:    Fear of current or ex partner: Not on file    Emotionally abused: Not on file    Physically abused: Not on file    Forced sexual activity: Not on file  Other Topics Concern  .  Not on file  Social History Narrative  . Not on file     Review of Systems General:  No chills, fever, night sweats or weight changes.  Cardiovascular:  No chest pain, dyspnea on exertion, edema, orthopnea, palpitations, paroxysmal nocturnal dyspnea. Dermatological: No rash, lesions/masses Respiratory: No cough, dyspnea Urologic: No hematuria, dysuria Abdominal:   No nausea, vomiting, diarrhea, bright red blood per rectum, melena, or hematemesis Neurologic:  No visual changes, wkns, changes in mental status. All other systems reviewed and  are otherwise negative except as noted above.  Physical Exam  There were no vitals taken for this visit.  General: Pleasant, NAD Psych: Normal affect. Neuro: Alert and oriented X 3. Moves all extremities spontaneously. HEENT: Normal  Neck: Supple without bruits or JVD. Lungs:  Resp regular and unlabored, CTA. Heart: RRR no s3, s4, or murmurs. Abdomen: Soft, non-tender, non-distended, BS + x 4.  Extremities: No clubbing, cyanosis or edema. DP/PT/Radials 2+ and equal bilaterally.  Accessory Clinical Findings  ECG - SR, HR 68/minute, PR 152, QRS 92, QT 418 ms, negative T waves in V3-5, nonspecific changes in I, II,III  Echocardiogram 11/14/2013  - Left ventricle: The cavity size was normal. Systolic function was normal. The estimated ejection fraction was in the range of 60% to 65%. Wall motion was normal; there were no regional wall motion abnormalities. Doppler parameters are consistent with abnormal left ventricular relaxation (grade 1 diastolic dysfunction). There was no evidence of elevated ventricular filling pressure by Doppler parameters. - Aortic valve: Trileaflet; normal thickness leaflets. No regurgitation. - Aortic root: The aortic root was normal in size. - Mitral valve: Structurally normal valve. No regurgitation. - Left atrium: The atrium was normal in size. - Right ventricle: Systolic function was normal. - Tricuspid  valve: Mild regurgitation. - Pulmonic valve: No regurgitation. - Pulmonary arteries: Systolic pressure was within the normal range.  Lexiscan nuclear stress test: 12/12/2014 Impression Exercise Capacity: Lexiscan with no exercise. BP Response: Normal blood pressure response. Clinical Symptoms: No significant symptoms noted. ECG Impression: No significant ST segment change suggestive of ischemia. Comparison with Prior Nuclear Study: No images to compare  Overall Impression: Low risk stress nuclear study . There is attenuation of the distal anterior wall but no evidence of ischemia. The contractility in that area is well preserved. .  LV Ejection Fraction: 51%. LV Wall Motion: NL LV Function; NL Wall Motion. Visually the LV function is greater than the 51% calculated.   EKG performed today 05/30/2017 shows sinus rhythm, negative T waves in inferior leads suspicious for inferior ischemia that are new. This was personally reviewed.    Assessment & Plan  67 year old female with h/o HTN, smoking, family h/o CAD, no known heart disease  1. Abnormal ECG, exertional chest pains - a Lexiscan nuclear stress test was negative for infarct or ischemia. Her chest pain has improved with Imdur 30 mg daily we will continue, we will also continue daily aspirin 81 mg po daily and 0.4 sl NTG PRN. Her EKG shows slightly more prominent negative T waves in inferior leads suspicious for anterolateral ischemia but she is now asymptomatic. Again she is strongly advised to quit smoking, she is very reluctant.  2. Hypertension - controlled on current regimen  3. Lipids - followed by PCP. Her lipids in March 2018 showed LDL 73, HDL 97, triglycerides 51. Hemoglobin A1c 6.7, creatinine 0.5.   4. Coronary calcifications - she is advised to quit smoking and start taking statin, she is reluctant to do either of those changes, she wants to think about it.  5. Headaches - she is advised to hold Imdur, if no  improvement resume Imdur and contact her PCP for further workup.  6. Smoking cessation - counseling provided, she continues to have a few at night.  Follow up in 6 months.  Tobias Alexander, MD 02/06/2018, 10:59 AM

## 2018-02-06 NOTE — Patient Instructions (Addendum)
Medication Instructions:   Your physician recommends that you continue on your current medications as directed. Please refer to the Current Medication list given to you today.    Follow-Up:  4 MONTHS WITH DR NELSON       If you need a refill on your cardiac medications before your next appointment, please call your pharmacy.   

## 2018-06-09 ENCOUNTER — Ambulatory Visit: Payer: Commercial Managed Care - PPO | Admitting: Cardiology

## 2018-06-25 ENCOUNTER — Other Ambulatory Visit: Payer: Self-pay | Admitting: Cardiology

## 2018-06-26 ENCOUNTER — Other Ambulatory Visit: Payer: Self-pay | Admitting: Cardiology

## 2018-07-03 ENCOUNTER — Other Ambulatory Visit: Payer: Self-pay | Admitting: Physician Assistant

## 2018-07-03 DIAGNOSIS — Z1231 Encounter for screening mammogram for malignant neoplasm of breast: Secondary | ICD-10-CM

## 2018-07-18 ENCOUNTER — Ambulatory Visit: Payer: Commercial Managed Care - PPO | Admitting: Physician Assistant

## 2018-07-19 ENCOUNTER — Ambulatory Visit: Payer: Commercial Managed Care - PPO | Admitting: Physician Assistant

## 2018-07-19 ENCOUNTER — Encounter: Payer: Self-pay | Admitting: Physician Assistant

## 2018-07-19 VITALS — BP 100/64 | HR 72 | Ht 59.0 in | Wt 110.6 lb

## 2018-07-19 DIAGNOSIS — R9431 Abnormal electrocardiogram [ECG] [EKG]: Secondary | ICD-10-CM

## 2018-07-19 DIAGNOSIS — I10 Essential (primary) hypertension: Secondary | ICD-10-CM

## 2018-07-19 DIAGNOSIS — Z720 Tobacco use: Secondary | ICD-10-CM

## 2018-07-19 DIAGNOSIS — R079 Chest pain, unspecified: Secondary | ICD-10-CM

## 2018-07-19 DIAGNOSIS — Z8249 Family history of ischemic heart disease and other diseases of the circulatory system: Secondary | ICD-10-CM

## 2018-07-19 NOTE — Progress Notes (Signed)
Cardiology Office Note    Date:  07/19/2018   ID:  Melissa Waters, DOB 06/23/1951, MRN [id number]  PCP:  Daisy Floro, MD  Cardiologist: Tobias Alexander, MD EPS: None  Chief Complaint  Patient presents with  . Follow-up    History of Present Illness:  Melissa Waters is a 67 y.o. female with history of hypertension, family history of CAD, tobacco abuse, abnormal EKG and history of chest pain.  Lexiscan Myoview in 2016- for ischemia.  Saw Dr. Delton See 02/2018 and reluctant to quit smoking.  Chest CT for lung cancer screening 01/2018 showed coronary atherosclerosis in the LAD.  Patient comes in for f/u. Had one episode of chest pain since she was here last relieved with NTG. Described as a tightness into her back. No regular exercise.  Stays busy, doesn't sit down. Smoking about 6 cigarettes/day.Was told Hbg A1C 7.4 and watching sugars. Was given pravastatin by PCP but has not started yet.    Past Medical History:  Diagnosis Date  . COPD (chronic obstructive pulmonary disease) (HCC)   . Diabetes mellitus without complication (HCC)    no meds   . Diverticulosis    Hx: of  . Heart murmur    as a baby  . Hypertension   . Nephritis    Hx: of 1958  . Osteoarthritis    right hip  . Osteoarthritis of left hip 08/13/2013    Past Surgical History:  Procedure Laterality Date  . COLONOSCOPY W/ POLYPECTOMY  2006  . TOTAL HIP ARTHROPLASTY Right    05/27/2009  . TOTAL HIP ARTHROPLASTY Left 08/13/2013   Procedure: TOTAL HIP ARTHROPLASTY;  Surgeon: Eulas Post, MD;  Location: [facility] OR;  Service: Orthopedics;  Laterality: Left;    Current Medications: Current Meds  Medication Sig  . aspirin EC 81 MG tablet Take 1 tablet (81 mg total) by mouth daily.  . Calcium Carbonate-Vitamin D (CALCIUM 600 + D PO) Take 1 tablet by mouth daily.  . Carboxymethylcellul-Glycerin (CLEAR EYES FOR DRY EYES OP) Place 1 drop into both eyes 2 (two) times daily. Clear Eyes for Dry Eyes  . carvedilol  (COREG) 3.125 MG tablet TAKE 1 TABLET TWICE A DAY WITH A MEAL  . Cholecalciferol (VITAMIN D3) 5000 units CAPS Take 1 capsule by mouth daily.  . clindamycin (CLEOCIN) 300 MG capsule Take 600 mg by mouth once as needed (prior to dental work).  Marland Kitchen ibuprofen (ADVIL,MOTRIN) 100 MG tablet Take 100 mg by mouth 2 (two) times daily.   . isosorbide mononitrate (IMDUR) 30 MG 24 hr tablet TAKE 1 TABLET BY MOUTH EVERY DAY  . MILK THISTLE PO Take 350 mg by mouth daily.  . nitroGLYCERIN (NITROSTAT) 0.4 MG SL tablet Place 1 tablet (0.4 mg total) under the tongue every 5 (five) minutes as needed for chest pain.  Marland Kitchen quinapril (ACCUPRIL) 20 MG tablet Take 20 mg by mouth daily before breakfast.      Allergies:   Amoxil [amoxicillin]   Social History   Socioeconomic History  . Marital status: Married    Spouse name: Not on file  . Number of children: Not on file  . Years of education: Not on file  . Highest education level: Not on file  Occupational History  . Not on file  Social Needs  . Financial resource strain: Not on file  . Food insecurity:    Worry: Not on file    Inability: Not on file  . Transportation needs:  Medical: Not on file    Non-medical: Not on file  Tobacco Use  . Smoking status: Current Every Day Smoker    Packs/day: 1.25    Years: 49.00    Pack years: 61.25    Types: Cigarettes  . Smokeless tobacco: Never Used  . Tobacco comment: 5 per day. Smoked 1/2 to 2 ppd x 49 years  Substance and Sexual Activity  . Alcohol use: Yes    Alcohol/week: 0.0 standard drinks    Comment: social  . Drug use: No  . Sexual activity: Not on file  Lifestyle  . Physical activity:    Days per week: Not on file    Minutes per session: Not on file  . Stress: Not on file  Relationships  . Social connections:    Talks on phone: Not on file    Gets together: Not on file    Attends religious service: Not on file    Active member of club or organization: Not on file    Attends meetings of clubs  or organizations: Not on file    Relationship status: Not on file  Other Topics Concern  . Not on file  Social History Narrative  . Not on file     Family History:  The patient's family history includes Breast cancer in her mother; Heart Problems (age of onset: 63) in her father; Heart attack (age of onset: 18) in her unknown relative; Hypertension in her mother.   ROS:   Please see the history of present illness.    Review of Systems  Constitution: Negative.  HENT: Negative.   Eyes: Negative.   Cardiovascular: Negative.   Respiratory: Negative.   Hematologic/Lymphatic: Negative.   Musculoskeletal: Negative.  Negative for joint pain.  Gastrointestinal: Positive for constipation and diarrhea.  Genitourinary: Negative.   Neurological: Negative.    All other systems reviewed and are negative.   PHYSICAL EXAM:   VS:  BP 100/64   Pulse 72   Ht 4\' 11"  (1.499 m)   Wt 110 lb 9.6 oz (50.2 kg)   SpO2 97%   BMI 22.34 kg/m   Physical Exam  GEN: Thin, in no acute distress  Neck: no JVD, carotid bruits, or masses Cardiac:RRR; positive S4, 1/6 systolic murmur at the left sternal border respiratory:  clear to auscultation bilaterally, normal work of breathing GI: soft, nontender, nondistended, + BS Ext: without cyanosis, clubbing, or edema, Good distal pulses bilaterally Neuro:  Alert and Oriented x 3 Psych: euthymic mood, full affect  Wt Readings from Last 3 Encounters:  07/19/18 110 lb 9.6 oz (50.2 kg)  02/06/18 107 lb (48.5 kg)  05/30/17 109 lb (49.4 kg)      Studies/Labs Reviewed:   EKG:  EKG is not ordered today.    Recent Labs: No results found for requested labs within last 8760 hours.   Lipid Panel    Component Value Date/Time   CHOL 180 06/12/2013 0832   TRIG 30.0 06/12/2013 0832   HDL 95.80 06/12/2013 0832   CHOLHDL 2 06/12/2013 0832   VLDL 6.0 06/12/2013 0832   LDLCALC 78 06/12/2013 0832    Additional studies/ records that were reviewed today include:      IMPRESSION: Lung-RADS 1, negative. Continue annual screening with low-dose chest CT without contrast in 12 months.   Aortic Atherosclerosis (ICD10-I70.0) and Emphysema (ICD10-J43.9).     Electronically Signed   By: Charline Bills M.D.   On: 01/26/2018 11:59    Scans on Order [id number]  Scan on 01/25/2018 12:05 PM by Gaspar Bidding: LCS formScan on 01/25/2018 12:05 PM by Tama High L: LCS form  Result History   CT CHEST LUNG CA SCREEN LOW DOS   Echocardiogram 11/14/2013   - Left ventricle: The cavity size was normal. Systolic function was normal. The estimated ejection fraction was in the range of 60% to 65%. Wall motion was normal; there were no regional wall motion abnormalities. Doppler parameters are consistent with abnormal left ventricular relaxation (grade 1 diastolic dysfunction). There was no evidence of elevated ventricular filling pressure by Doppler parameters. - Aortic valve: Trileaflet; normal thickness leaflets. No regurgitation. - Aortic root: The aortic root was normal in size. - Mitral valve: Structurally normal valve. No regurgitation. - Left atrium: The atrium was normal in size. - Right ventricle: Systolic function was normal. - Tricuspid valve: Mild regurgitation. - Pulmonic valve: No regurgitation. - Pulmonary arteries: Systolic pressure was within the normal range.   Lexiscan nuclear stress test: 12/12/2014 Impression Exercise Capacity:  Lexiscan with no exercise. BP Response:  Normal blood pressure response. Clinical Symptoms:  No significant symptoms noted. ECG Impression:  No significant ST segment change suggestive of ischemia. Comparison with Prior Nuclear Study: No images to compare   Overall Impression:  Low risk stress nuclear study .   There is attenuation of the distal anterior wall but no evidence of ischemia.  The contractility in that area is well preserved. .   LV Ejection Fraction: 51%.  LV Wall Motion:  NL LV Function;  NL Wall Motion.     Visually the LV function is greater than the 51% calculated.       ASSESSMENT:    1. Chest pain, unspecified type   2. Abnormal ECG   3. Essential hypertension   4. Family history of early CAD   5. Tobacco abuse      PLAN:  In order of problems listed above:  History of abnormal EKG and chest pain.  Nuclear stress test in 2016 was normal.  She does take Imdur daily.  She is used one nitroglycerin sublingual since she was here last.  Again stressed the importance of smoking cessation.  Given pravastatin by PCP but she has not started it yet.  She wanted to talk to [place] first.  Encouraged her to take this.  She says she will try taking it once weekly.  Call if she has any further chest pain.  Follow-up with Dr. Delton See in May.  Essential hypertension blood pressure is actually on the low side today.  Patient is not dizzy.  If she does develop any symptoms with this could decrease Imdur or quinapril.  Family history of CAD recommend she take pravastatin as prescribed by PCP  Tobacco abuse long discussion on the importance of smoking cessation.    Medication Adjustments/Labs and Tests Ordered: Current medicines are reviewed at length with the patient today.  Concerns regarding medicines are outlined above.  Medication changes, Labs and Tests ordered today are listed in the Patient Instructions below. Patient Instructions  Medication Instructions:  Your physician recommends that you continue on your current medications as directed. Please refer to the Current Medication list given to you today.  If you need a refill on your cardiac medications before your next appointment, please call your pharmacy.   Lab work: None ordered If you have labs (blood work) drawn today and your tests are completely normal, you will receive your results only by: Marland Kitchen MyChart Message (if you have MyChart)  OR . A paper copy in the mail If you have any lab test that is abnormal or we need to  change your treatment, we will call you to review the results.  Testing/Procedures: None ordered  Follow-Up: At [facility], you and your health needs are our priority.  As part of our continuing mission to provide you with exceptional heart care, we have created designated Provider Care Teams.  These Care Teams include your primary Cardiologist (physician) and Advanced Practice Providers (APPs -  Physician Assistants and Nurse Practitioners) who all work together to provide you with the care you need, when you need it. You will need a follow up appointment in 5 months.  Please call our office 2 months in advance to schedule this appointment.  You may see Tobias Alexander, MD or one of the following Advanced Practice Providers on your designated Care Team:   [place], PA-C Ronie Spies, PA-C . Jacolyn Reedy, PA-C  Any Other Special Instructions Will Be Listed Below (If Applicable).  Steps to Quit Smoking Smoking tobacco can be bad for your health. It can also affect almost every organ in your body. Smoking puts you and people around you at risk for many serious long-lasting (chronic) diseases. Quitting smoking is hard, but it is one of the best things that you can do for your health. It is never too late to quit. What are the benefits of quitting smoking? When you quit smoking, you lower your risk for getting serious diseases and conditions. They can include:  Lung cancer or lung disease.  Heart disease.  Stroke.  Heart attack.  Not being able to have children (infertility).  Weak bones (osteoporosis) and broken bones (fractures).  If you have coughing, wheezing, and shortness of breath, those symptoms may get better when you quit. You may also get sick less often. If you are pregnant, quitting smoking can help to lower your chances of having a baby of low birth weight. What can I do to help me quit smoking? Talk with your doctor about what can help you quit smoking. Some  things you can do (strategies) include:  Quitting smoking totally, instead of slowly cutting back how much you smoke over a period of time.  Going to in-person counseling. You are more likely to quit if you go to many counseling sessions.  Using resources and support systems, such as: ? Agricultural engineer with a Veterinary surgeon. ? Phone quitlines. ? Automotive engineer. ? Support groups or group counseling. ? Text messaging programs. ? Mobile phone apps or applications.  Taking medicines. Some of these medicines may have nicotine in them. If you are pregnant or breastfeeding, do not take any medicines to quit smoking unless your doctor says it is okay. Talk with your doctor about counseling or other things that can help you.  Talk with your doctor about using more than one strategy at the same time, such as taking medicines while you are also going to in-person counseling. This can help make quitting easier. What things can I do to make it easier to quit? Quitting smoking might feel very hard at first, but there is a lot that you can do to make it easier. Take these steps:  Talk to your family and friends. Ask them to support and encourage you.  Call phone quitlines, reach out to support groups, or work with a Veterinary surgeon.  Ask people who smoke to not smoke around you.  Avoid places that make you want (trigger) to smoke,  such as: ? Bars. ? Parties. ? Smoke-break areas at work.  Spend time with people who do not smoke.  Lower the stress in your life. Stress can make you want to smoke. Try these things to help your stress: ? Getting regular exercise. ? Deep-breathing exercises. ? Yoga. ? Meditating. ? Doing a body scan. To do this, close your eyes, focus on one area of your body at a time from head to toe, and notice which parts of your body are tense. Try to relax the muscles in those areas.  Download or buy apps on your mobile phone or tablet that can help you stick to your quit  plan. There are many free apps, such as QuitGuide from the [employer] developer for Disease Control and Prevention). You can find more support from smokefree.gov and other websites.  This information is not intended to replace advice given to you by your health care provider. Make sure you discuss any questions you have with your health care provider. Document Released: 07/17/2009 Document Revised: 05/18/2016 Document Reviewed: 02/04/2015 Elsevier Interactive Patient Education  [address]         Signed, Jacolyn Reedy, [place]  07/19/2018 2:10 PM    [facility] [address] [place], [place], [place]  [address] Phone: [phone]; Fax: [phone]

## 2018-07-19 NOTE — Patient Instructions (Signed)
Medication Instructions:  Your physician recommends that you continue on your current medications as directed. Please refer to the Current Medication list given to you today.  If you need a refill on your cardiac medications before your next appointment, please call your pharmacy.   Lab work: None ordered If you have labs (blood work) drawn today and your tests are completely normal, you will receive your results only by: Marland Kitchen MyChart Message (if you have MyChart) OR . A paper copy in the mail If you have any lab test that is abnormal or we need to change your treatment, we will call you to review the results.  Testing/Procedures: None ordered  Follow-Up: At [facility], you and your health needs are our priority.  As part of our continuing mission to provide you with exceptional heart care, we have created designated Provider Care Teams.  These Care Teams include your primary Cardiologist (physician) and Advanced Practice Providers (APPs -  Physician Assistants and Nurse Practitioners) who all work together to provide you with the care you need, when you need it. You will need a follow up appointment in 5 months.  Please call our office 2 months in advance to schedule this appointment.  You may see Tobias Alexander, MD or one of the following Advanced Practice Providers on your designated Care Team:   [place], PA-C Ronie Spies, PA-C . Jacolyn Reedy, PA-C  Any Other Special Instructions Will Be Listed Below (If Applicable).  Steps to Quit Smoking Smoking tobacco can be bad for your health. It can also affect almost every organ in your body. Smoking puts you and people around you at risk for many serious long-lasting (chronic) diseases. Quitting smoking is hard, but it is one of the best things that you can do for your health. It is never too late to quit. What are the benefits of quitting smoking? When you quit smoking, you lower your risk for getting serious diseases and  conditions. They can include:  Lung cancer or lung disease.  Heart disease.  Stroke.  Heart attack.  Not being able to have children (infertility).  Weak bones (osteoporosis) and broken bones (fractures).  If you have coughing, wheezing, and shortness of breath, those symptoms may get better when you quit. You may also get sick less often. If you are pregnant, quitting smoking can help to lower your chances of having a baby of low birth weight. What can I do to help me quit smoking? Talk with your doctor about what can help you quit smoking. Some things you can do (strategies) include:  Quitting smoking totally, instead of slowly cutting back how much you smoke over a period of time.  Going to in-person counseling. You are more likely to quit if you go to many counseling sessions.  Using resources and support systems, such as: ? Agricultural engineer with a Veterinary surgeon. ? Phone quitlines. ? Automotive engineer. ? Support groups or group counseling. ? Text messaging programs. ? Mobile phone apps or applications.  Taking medicines. Some of these medicines may have nicotine in them. If you are pregnant or breastfeeding, do not take any medicines to quit smoking unless your doctor says it is okay. Talk with your doctor about counseling or other things that can help you.  Talk with your doctor about using more than one strategy at the same time, such as taking medicines while you are also going to in-person counseling. This can help make quitting easier. What things can I do to  make it easier to quit? Quitting smoking might feel very hard at first, but there is a lot that you can do to make it easier. Take these steps:  Talk to your family and friends. Ask them to support and encourage you.  Call phone quitlines, reach out to support groups, or work with a Veterinary surgeon.  Ask people who smoke to not smoke around you.  Avoid places that make you want (trigger) to smoke, such  as: ? Bars. ? Parties. ? Smoke-break areas at work.  Spend time with people who do not smoke.  Lower the stress in your life. Stress can make you want to smoke. Try these things to help your stress: ? Getting regular exercise. ? Deep-breathing exercises. ? Yoga. ? Meditating. ? Doing a body scan. To do this, close your eyes, focus on one area of your body at a time from head to toe, and notice which parts of your body are tense. Try to relax the muscles in those areas.  Download or buy apps on your mobile phone or tablet that can help you stick to your quit plan. There are many free apps, such as QuitGuide from the [employer] developer for Disease Control and Prevention). You can find more support from smokefree.gov and other websites.  This information is not intended to replace advice given to you by your health care provider. Make sure you discuss any questions you have with your health care provider. Document Released: 07/17/2009 Document Revised: 05/18/2016 Document Reviewed: 02/04/2015 Elsevier Interactive Patient Education  2018 [employer].

## 2018-08-04 ENCOUNTER — Ambulatory Visit
Admission: RE | Admit: 2018-08-04 | Discharge: 2018-08-04 | Disposition: A | Discharge status: Home / Self Care | Payer: Commercial Managed Care - PPO | Source: Ambulatory Visit | Attending: Physician Assistant | Admitting: Physician Assistant

## 2018-08-04 ENCOUNTER — Other Ambulatory Visit: Payer: Self-pay | Admitting: Family Medicine

## 2018-08-04 DIAGNOSIS — Z1231 Encounter for screening mammogram for malignant neoplasm of breast: Secondary | ICD-10-CM

## 2018-10-04 ENCOUNTER — Emergency Department (HOSPITAL_COMMUNITY): Payer: Medicare Other

## 2018-10-04 ENCOUNTER — Other Ambulatory Visit: Payer: Self-pay

## 2018-10-04 ENCOUNTER — Encounter (HOSPITAL_COMMUNITY): Payer: Self-pay | Admitting: Emergency Medicine

## 2018-10-04 ENCOUNTER — Emergency Department (HOSPITAL_COMMUNITY)
Admission: EM | Admit: 2018-10-04 | Discharge: 2018-10-04 | Disposition: A | Discharge status: Home / Self Care | Payer: Medicare Other | Attending: Emergency Medicine | Admitting: Emergency Medicine

## 2018-10-04 DIAGNOSIS — I10 Essential (primary) hypertension: Secondary | ICD-10-CM | POA: Insufficient documentation

## 2018-10-04 DIAGNOSIS — E119 Type 2 diabetes mellitus without complications: Secondary | ICD-10-CM | POA: Insufficient documentation

## 2018-10-04 DIAGNOSIS — S73014A Posterior dislocation of right hip, initial encounter: Secondary | ICD-10-CM | POA: Insufficient documentation

## 2018-10-04 DIAGNOSIS — Y939 Activity, unspecified: Secondary | ICD-10-CM | POA: Diagnosis not present

## 2018-10-04 DIAGNOSIS — J449 Chronic obstructive pulmonary disease, unspecified: Secondary | ICD-10-CM | POA: Insufficient documentation

## 2018-10-04 DIAGNOSIS — F17210 Nicotine dependence, cigarettes, uncomplicated: Secondary | ICD-10-CM | POA: Insufficient documentation

## 2018-10-04 DIAGNOSIS — Y929 Unspecified place or not applicable: Secondary | ICD-10-CM | POA: Insufficient documentation

## 2018-10-04 DIAGNOSIS — Y33XXXA Other specified events, undetermined intent, initial encounter: Secondary | ICD-10-CM | POA: Diagnosis not present

## 2018-10-04 DIAGNOSIS — Z7982 Long term (current) use of aspirin: Secondary | ICD-10-CM | POA: Diagnosis not present

## 2018-10-04 DIAGNOSIS — Y998 Other external cause status: Secondary | ICD-10-CM | POA: Insufficient documentation

## 2018-10-04 DIAGNOSIS — M25559 Pain in unspecified hip: Secondary | ICD-10-CM

## 2018-10-04 DIAGNOSIS — Z79899 Other long term (current) drug therapy: Secondary | ICD-10-CM | POA: Diagnosis not present

## 2018-10-04 DIAGNOSIS — S73004A Unspecified dislocation of right hip, initial encounter: Secondary | ICD-10-CM

## 2018-10-04 DIAGNOSIS — S79911A Unspecified injury of right hip, initial encounter: Secondary | ICD-10-CM | POA: Diagnosis present

## 2018-10-04 MED ORDER — FENTANYL CITRATE (PF) 100 MCG/2ML IJ SOLN
50.0000 ug | Freq: Once | INTRAMUSCULAR | Status: AC
  Administered 2018-10-04: 50 ug via INTRAVENOUS
  Filled 2018-10-04: qty 2

## 2018-10-04 MED ORDER — PROPOFOL 10 MG/ML IV BOLUS
25.0000 mg | Freq: Once | INTRAVENOUS | Status: AC
  Administered 2018-10-04: 25 mg via INTRAVENOUS
  Filled 2018-10-04: qty 20

## 2018-10-04 MED ORDER — MORPHINE SULFATE (PF) 2 MG/ML IV SOLN
2.0000 mg | Freq: Once | INTRAVENOUS | Status: AC
  Administered 2018-10-04: 2 mg via INTRAVENOUS
  Filled 2018-10-04: qty 1

## 2018-10-04 MED ORDER — ONDANSETRON HCL 4 MG/2ML IJ SOLN
4.0000 mg | Freq: Once | INTRAMUSCULAR | Status: AC
  Administered 2018-10-04: 4 mg via INTRAVENOUS
  Filled 2018-10-04: qty 2

## 2018-10-04 MED ORDER — PROPOFOL 10 MG/ML IV BOLUS
INTRAVENOUS | Status: AC | PRN
  Administered 2018-10-04: 25 mg via INTRAVENOUS
  Administered 2018-10-04: 30 mg via INTRAVENOUS

## 2018-10-04 NOTE — ED Notes (Signed)
Informed consent signed and at bedside  

## 2018-10-04 NOTE — ED Provider Notes (Signed)
MSE was initiated and I personally evaluated the patient and placed orders (if any) at  2:43 PM on October 04, 2018.  The patient appears stable so that the remainder of the MSE may be completed by another provider.  Patient is a 68 year old female with a history of bilateral total hip arthroplasty, type 2 diabetes mellitus, hypertension presenting for possible dislocation of the right hip and right hip pain.  Patient reports that she was bending down to pick up something off the floor, when she found her right hip "pop"" backwards.  Patient reports that she fell backwards.  Reports she grazed the back of her head on a shelf, however did not lose consciousness, denies any pain in her head.  Denies any other pain from the fall.  On exam, patient has shortened and internally rotated right hip.  She has tenderness to palpation of the anterior right hip.  Patient has soft compartments throughout the right lower extremity.  Patient has 2+ popliteal pulse, 2+ PT pulse, DP pulses difficult to identify on exam but capillary refill less than 2 seconds.   Orders for analgesia placed.  Patient awaiting portable x-ray. Care assumed by Dr. Cathren LaineKevin Steinl, MD.    Melissa Waters, Melissa Lavis B, PA-C 10/04/18 1505    Shaune PollackIsaacs, [place], MD 10/05/18 [phone]

## 2018-10-04 NOTE — Discharge Instructions (Signed)
It was our pleasure to provide your ER care today - we hope that you feel better.  Wear knee immobilizer. Avoid bending in manner that may predispose to hip dislocation.   Take acetaminophen and/or ibuprofen as need.   Follow up with your orthopedist in the coming week - call office to arrange appointment.   Return to ER if worse, new symptoms, severe pain, numbness, other concern.   You were given sedation in the ER - no driving for the next 12 hours.

## 2018-10-04 NOTE — ED Notes (Signed)
Patient back from x-ray 

## 2018-10-04 NOTE — ED Notes (Signed)
Patient transported to x-ray. ?

## 2018-10-04 NOTE — ED Provider Notes (Addendum)
[facility]   CSN: [id number] Arrival date & time: 10/04/18  1350     History   Chief Complaint Chief Complaint  Patient presents with  . Hip Pain    HPI Melissa Waters is a 68 y.o. female.  Patient presents with acute onset right hip pain post bending over this AM. When bending, felt pop, and fell onto hip. Denies hitting head or loc.  Pain constant, dull, moderate, non radiating. Hx right hip tha 2010. Pt denies knee pain. No low back pain or radicular pain. No numbness/weakness. Denies other pain or injury.  The history is provided by the patient.  Hip Pain  Pertinent negatives include no chest pain, no abdominal pain, no headaches and no shortness of breath.    Past Medical History:  Diagnosis Date  . COPD (chronic obstructive pulmonary disease) (HCC)   . Diabetes mellitus without complication (HCC)    no meds   . Diverticulosis    Hx: of  . Heart murmur    as a baby  . Hypertension   . Nephritis    Hx: of 1958  . Osteoarthritis    right hip  . Osteoarthritis of left hip 08/13/2013    Patient Active Problem List   Diagnosis Date Noted  . Family history of early CAD 07/19/2018  . Tobacco abuse 07/19/2018  . Chest pain 11/23/2013  . Essential hypertension 11/23/2013  . Abnormal ECG 11/23/2013  . Osteoarthritis of left hip 08/13/2013    Past Surgical History:  Procedure Laterality Date  . COLONOSCOPY W/ POLYPECTOMY  2006  . TOTAL HIP ARTHROPLASTY Right    05/27/2009  . TOTAL HIP ARTHROPLASTY Left 08/13/2013   Procedure: TOTAL HIP ARTHROPLASTY;  Surgeon: Eulas PostJoshua P Landau, MD;  Location: [facility] OR;  Service: Orthopedics;  Laterality: Left;     OB History   No obstetric history on file.      Home Medications    Prior to Admission medications   Medication Sig Start Date End Date Taking? Authorizing Provider  aspirin EC 81 MG tablet Take 1 tablet (81 mg total) by mouth daily. 11/23/13  Yes Lars MassonNelson,  Katarina H, MD  BIOTIN PO Take 1 tablet by mouth daily.   Yes [provider]  Calcium Carbonate-Vitamin D (CALCIUM 600 + D PO) Take 1 tablet by mouth daily.   Yes [provider]  Carboxymethylcellul-Glycerin (CLEAR EYES FOR DRY EYES OP) Place 1 drop into both eyes 2 (two) times daily. Clear Eyes for Dry Eyes   Yes [provider]  carvedilol (COREG) 3.125 MG tablet TAKE 1 TABLET TWICE A DAY WITH A MEAL Patient taking differently: Take 3.125 mg by mouth 2 (two) times daily with a meal.  06/26/18  Yes Lars MassonNelson, Katarina H, MD  Cholecalciferol (VITAMIN D3) 5000 units CAPS Take 1 capsule by mouth daily.   Yes [provider]  clindamycin (CLEOCIN) 300 MG capsule Take 600 mg by mouth once as needed (prior to dental work).   Yes [provider]  ibuprofen (ADVIL,MOTRIN) 100 MG tablet Take 200 mg by mouth daily.    Yes [provider]  isosorbide mononitrate (IMDUR) 30 MG 24 hr tablet TAKE 1 TABLET BY MOUTH EVERY DAY Patient taking differently: Take 30 mg by mouth daily.  06/26/18  Yes Lars MassonNelson, Katarina H, MD  MILK THISTLE PO Take 350 mg by mouth daily.   Yes [provider]  nitroGLYCERIN (NITROSTAT) 0.4 MG SL tablet Place 1 tablet (  0.4 mg total) under the tongue every 5 (five) minutes as needed for chest pain. 05/30/17  Yes Lars Masson, MD  quinapril (ACCUPRIL) 20 MG tablet Take 20 mg by mouth daily before breakfast.    Yes [provider]    Family History Family History  Problem Relation Age of Onset  . Heart Problems Father 44       CABG  . Hypertension Mother   . Breast cancer Mother   . Heart attack Other 60       fatal MI    Social History Social History   Tobacco Use  . Smoking status: Current Every Day Smoker    Packs/day: 1.25    Years: 49.00    Pack years: 61.25    Types: Cigarettes  . Smokeless tobacco: Never Used  . Tobacco comment: 5 per day. Smoked 1/2 to 2 ppd x 49 years  Substance Use Topics    . Alcohol use: Yes    Alcohol/week: 0.0 standard drinks    Comment: social  . Drug use: No     Allergies   Amoxil [amoxicillin]   Review of Systems Review of Systems  Constitutional: Negative for fever.  HENT: Negative for nosebleeds.   Eyes: Negative for pain.  Respiratory: Negative for shortness of breath.   Cardiovascular: Negative for chest pain.  Gastrointestinal: Negative for abdominal pain.  Genitourinary: Negative for flank pain.  Musculoskeletal: Negative for back pain and neck pain.  Skin: Negative for wound.  Neurological: Negative for numbness and headaches.  Hematological: Does not bruise/bleed easily.  Psychiatric/Behavioral: Negative for confusion.     Physical Exam Updated Vital Signs BP (!) 140/92   Pulse 70   Temp 97.7 F (36.5 C) (Oral)   Resp 18   Ht 1.499 m (4\' 11" )   Wt 47.6 kg   SpO2 94%   BMI 21.21 kg/m   Physical Exam Vitals signs and nursing note reviewed.  Constitutional:      Appearance: Normal appearance. She is well-developed.  HENT:     Head: Atraumatic.  Eyes:     General: No scleral icterus.    Conjunctiva/sclera: Conjunctivae normal.     Pupils: Pupils are equal, round, and reactive to light.  Neck:     Musculoskeletal: Normal range of motion and neck supple. No muscular tenderness.     Trachea: No tracheal deviation.  Cardiovascular:     Rate and Rhythm: Normal rate.     Pulses: Normal pulses.  Pulmonary:     Effort: Pulmonary effort is normal. No respiratory distress.     Breath sounds: Normal breath sounds.  Abdominal:     General: There is no distension.  Musculoskeletal:     Comments: c spine non tender, aligned. Right hip pain/tenderness with right lower ext shortened/rotated. Distal pulses palp bil.   Skin:    General: Skin is warm and dry.     Findings: No rash.  Neurological:     Mental Status: She is alert.     Comments: Motor/sens grossly intact RLE. Speech clear/fluent.   Psychiatric:        Mood  and Affect: Mood normal.      ED Treatments / Results  Labs (all labs ordered are listed, but only abnormal results are displayed) Labs Reviewed - No data to display  EKG None  Radiology Dg Hip Unilat W Or W/o Pelvis 2-3 Views Right  Result Date: 10/04/2018 CLINICAL DATA:  Hip pain after reduction EXAM: DG HIP (WITH  OR WITHOUT PELVIS) 2-3V RIGHT COMPARISON:  Earlier today FINDINGS: Relocated right hip arthroplasty without periprosthetic fracture. No evidence of hardware loosening. IMPRESSION: Relocated right hip arthroplasty.  Negative for fracture. Electronically Signed   By: Marnee SpringJonathon  Watts M.D.   On: 10/04/2018 18:10   Dg Hip Unilat With Pelvis 2-3 Views Right  Result Date: 10/04/2018 CLINICAL DATA:  Right hip pain which occurred while bending over today. EXAM: DG HIP (WITH OR WITHOUT PELVIS) 2-3V RIGHT COMPARISON:  None. FINDINGS: There is superolateral dislocation of the femoral component of the right total hip prosthesis. No fractures. Left total hip prosthesis is in good position in the AP projection. IMPRESSION: Dislocation of the femoral component of the right total hip prosthesis. Electronically Signed   By: Francene BoyersJames  Maxwell M.D.   On: 10/04/2018 16:16    Procedures .Sedation Date/Time: 10/04/2018 6:25 PM Performed by: Cathren LaineSteinl, Keon Pender, MD Authorized by: Cathren LaineSteinl, Netha Dafoe, MD   Consent:    Consent obtained:  Verbal and written   Consent given by:  Patient Universal protocol:    Immediately prior to procedure a time out was called: yes   Pre-sedation assessment:    Time since last food or drink:  This AM   ASA classification: class 2 - patient with mild systemic disease     Mallampati score:  I - soft palate, uvula, fauces, pillars visible   Pre-sedation assessments completed and reviewed: airway patency, cardiovascular function, hydration status, mental status, nausea/vomiting, pain level, respiratory function and temperature   Procedure details (see MAR for exact dosages):     Preoxygenation:  Nasal cannula   Sedation:  Propofol   Intra-procedure monitoring:  Blood pressure monitoring, continuous capnometry, frequent LOC assessments, continuous pulse oximetry and cardiac monitor   Intra-procedure events: none     Total Provider sedation time (minutes):  25 Post-procedure details:    Post-sedation assessment completed:  10/04/2018 6:27 PM   Attendance: Constant attendance by certified staff until patient recovered     Recovery: Patient returned to pre-procedure baseline     Post-sedation assessments completed and reviewed: airway patency, cardiovascular function, mental status and respiratory function     Patient is stable for discharge or admission: yes     Patient tolerance:  Tolerated well, no immediate complications  .Ortho Injury Treatment Date/Time: 10/04/2018 6:28 PM Performed by: Cathren LaineSteinl, Madlyn Crosby, MD Authorized by: Cathren LaineSteinl, Hideko Esselman, MD   Consent:    Consent obtained:  Verbal and written   Consent given by:  Patient   Risks discussed:  Fracture, irreducible dislocation, recurrent dislocation, nerve damage, restricted joint movement and vascular damageInjury location: hip Location details: right hip Injury type: dislocation Dislocation type: posterior Pre-procedure neurovascular assessment: neurovascularly intact Pre-procedure distal perfusion: normal Pre-procedure neurological function: normal Pre-procedure range of motion: normal  Patient sedated: Yes. Refer to sedation procedure documentation for details of sedation. Manipulation performed: yes Reduction method: 90/90. Reduction successful: yes X-ray confirmed reduction: yes Immobilization: brace Post-procedure neurovascular assessment: post-procedure neurovascularly intact Post-procedure distal perfusion: normal Post-procedure neurological function: normal Post-procedure range of motion: normal Patient tolerance: Patient tolerated the procedure well with no immediate complications    (including  critical care time)  Medications Ordered in ED Medications  propofol (DIPRIVAN) 10 mg/mL bolus/IV push 25 mg (has no administration in time range)  fentaNYL (SUBLIMAZE) injection 50 mcg (50 mcg Intravenous Given 10/04/18 1418)  morphine 2 MG/ML injection 2 mg (2 mg Intravenous Given 10/04/18 1507)  ondansetron (ZOFRAN) injection 4 mg (4 mg Intravenous Given 10/04/18 1504)  Initial Impression / Assessment and Plan / ED Course  I have reviewed the triage vital signs and the nursing notes.  Pertinent labs & imaging results that were available during my care of the patient were reviewed by me and considered in my medical decision making (see chart for details).  Xrays.   Morphine iv for pain.  Procedural sedation for hip reduction.  Reviewed nursing notes and prior charts for additional history.   Sedation with propofol. Using 90/90 technique hip reduced. Distal pulses palp.  Recheck post sedation, fully awake and alert. Pt requests d/c. Pain controlled. No numbness/weakness.     Final Clinical Impressions(s) / ED Diagnoses   Final diagnoses:  Hip pain    ED Discharge Orders    None           Cathren Laine, MD 10/04/18 [phone]

## 2018-10-04 NOTE — ED Notes (Signed)
Pt. Back from xray

## 2018-10-04 NOTE — ED Triage Notes (Signed)
Per [facility]: Patient to ED for R hip dislocation after bending down to pick something up off the floor. Pedal pulses equal bilaterally, sensation intact. 18g. LFA - given fentanyl PTA. EMS VS: 152/80, P 63, RR 16, 98% RA, CBG 197.

## 2018-12-26 ENCOUNTER — Encounter: Payer: Self-pay | Admitting: *Deleted

## 2018-12-28 ENCOUNTER — Telehealth: Payer: Self-pay | Admitting: Cardiology

## 2018-12-28 NOTE — Telephone Encounter (Signed)
Based on chart review I would postpone her visit for 6-12 weeks unless she has new or worsening symptoms.  Tobias Alexander, MD

## 2018-12-28 NOTE — Telephone Encounter (Signed)
Pts appt is rescheduled for 01/25/19 at 3 pm with Dr Delton See.  This was made by our scheduling dept Dessie Coma.

## 2019-01-04 ENCOUNTER — Ambulatory Visit: Payer: Medicare Other | Admitting: Cardiology

## 2019-01-18 ENCOUNTER — Telehealth: Payer: Self-pay | Admitting: *Deleted

## 2019-01-18 NOTE — Telephone Encounter (Signed)
Virtual Visit Pre-Appointment Phone Call  Steps For Call:  1. Confirm consent - "In the setting of the current Covid19 crisis, you are scheduled for a (phone ) visit with Dr. Delton See on 4/20 at 2:40pm.  Just as we do with many in-office visits, in order for you to participate in this visit, we must obtain consent.  If you'd like, I can send this to your mychart (if signed up) or email for you to review.  Otherwise, I can obtain your verbal consent now.  All virtual visits are billed to your insurance company just like a normal visit would be.  By agreeing to a virtual visit, we'd like you to understand that the technology does not allow for your provider to perform an examination, and thus may limit your provider's ability to fully assess your condition.  Finally, though the technology is pretty good, we cannot assure that it will always work on either your or our end, and in the setting of a video visit, we may have to convert it to a phone-only visit.  In either situation, we cannot ensure that we have a secure connection.  Are you willing to proceed?" STAFF: Did the patient verbally acknowledge consent to telehealth visit? Document YES/NO here: YES PT GAVE VERBAL CONSENT OVER THE PHONE FOR DR Delton See TO TREAT HER ON 4/20 AT 2:40 PM VIA TELEPHONE VISIT.  2. Confirm the BEST phone number to call the day of the visit by including in appointment notes-YES  3. Give patient instructions for TELEPHONE VISIT WITH DR Delton See ON 4/20  4. Advise patient to be prepared with their blood pressure, heart rate, weight, any heart rhythm information, their current medicines, and a piece of paper and pen handy for any instructions they may receive the day of their visit  5. Inform patient they will receive a phone call 15 minutes prior to their appointment time (may be from unknown caller ID) so they should be prepared to answer  6. Confirm that appointment type is correct in Epic appointment notes ( PHONE VISIT  WITH DR Delton See ON 4/20)     TELEPHONE CALL NOTE  Melissa Waters has been deemed a candidate for a follow-up tele-health visit to limit community exposure during the Covid-19 pandemic. I spoke with the patient via phone to ensure availability of phone/video source, confirm preferred email & phone number, and discuss instructions and expectations.  I reminded Melissa Waters to be prepared with any vital sign and/or heart rhythm information that could potentially be obtained via home monitoring, at the time of her visit. I reminded Melissa Waters to expect a phone call at the time of her visit if her visit.  Melissa Socks, LPN 8/46/9629 52:84 PM      FULL LENGTH CONSENT FOR TELE-HEALTH VISIT   I hereby voluntarily request, consent and authorize [facility] and its employed or contracted physicians, physician assistants, nurse practitioners or other licensed health care professionals (the Practitioner), to provide me with telemedicine health care services (the "Services") as deemed necessary by the treating Practitioner. I acknowledge and consent to receive the Services by the Practitioner via telemedicine. I understand that the telemedicine visit will involve communicating with the Practitioner through live audiovisual communication technology and the disclosure of certain medical information by electronic transmission. I acknowledge that I have been given the opportunity to request an in-person assessment or other available alternative prior to the telemedicine visit and am voluntarily participating in the telemedicine visit.  I understand that I have the right to withhold or withdraw my consent to the use of telemedicine in the course of my care at any time, without affecting my right to future care or treatment, and that the Practitioner or I may terminate the telemedicine visit at any time. I understand that I have the right to inspect all information obtained and/or recorded in the course of  the telemedicine visit and may receive copies of available information for a reasonable fee.  I understand that some of the potential risks of receiving the Services via telemedicine include:  Marland Kitchen. Delay or interruption in medical evaluation due to technological equipment failure or disruption; . Information transmitted may not be sufficient (e.g. poor resolution of images) to allow for appropriate medical decision making by the Practitioner; and/or  . In rare instances, security protocols could fail, causing a breach of personal health information.  Furthermore, I acknowledge that it is my responsibility to provide information about my medical history, conditions and care that is complete and accurate to the best of my ability. I acknowledge that Practitioner's advice, recommendations, and/or decision may be based on factors not within their control, such as incomplete or inaccurate data provided by me or distortions of diagnostic images or specimens that may result from electronic transmissions. I understand that the practice of medicine is not an exact science and that Practitioner makes no warranties or guarantees regarding treatment outcomes. I acknowledge that I will receive a copy of this consent concurrently upon execution via email to the email address I last provided but may also request a printed copy by calling the office of [facility].    I understand that my insurance will be billed for this visit.   I have read or had this consent read to me. . I understand the contents of this consent, which adequately explains the benefits and risks of the Services being provided via telemedicine.  . I have been provided ample opportunity to ask questions regarding this consent and the Services and have had my questions answered to my satisfaction. . I give my informed consent for the services to be provided through the use of telemedicine in my medical care  By participating in this telemedicine  visit I agree to the above.  PT GAVE VERBAL CONSENT OVER THE PHONE FOR DR Delton SeeNELSON TO TREAT HER VIA TELEPHONE VISIT ON 4/20 AT 2:40 PM.  PT DOES NOT WANT MYCHART AND HAS NO CAPABILITY TO DO VIDEO VISIT.

## 2019-01-22 ENCOUNTER — Encounter: Payer: Self-pay | Admitting: Cardiology

## 2019-01-22 ENCOUNTER — Telehealth (INDEPENDENT_AMBULATORY_CARE_PROVIDER_SITE_OTHER): Payer: Medicare Other | Admitting: Cardiology

## 2019-01-22 ENCOUNTER — Other Ambulatory Visit: Payer: Self-pay

## 2019-01-22 DIAGNOSIS — I10 Essential (primary) hypertension: Secondary | ICD-10-CM | POA: Diagnosis not present

## 2019-01-22 DIAGNOSIS — Z8249 Family history of ischemic heart disease and other diseases of the circulatory system: Secondary | ICD-10-CM | POA: Diagnosis not present

## 2019-01-22 DIAGNOSIS — Z789 Other specified health status: Secondary | ICD-10-CM | POA: Diagnosis not present

## 2019-01-22 DIAGNOSIS — I2510 Atherosclerotic heart disease of native coronary artery without angina pectoris: Secondary | ICD-10-CM | POA: Diagnosis not present

## 2019-01-22 DIAGNOSIS — F17200 Nicotine dependence, unspecified, uncomplicated: Secondary | ICD-10-CM

## 2019-01-22 MED ORDER — NITROGLYCERIN 0.4 MG SL SUBL: 0.4000 mg | SUBLINGUAL_TABLET | SUBLINGUAL | 3 refills | Status: DC | PRN

## 2019-01-22 MED ORDER — CARVEDILOL 3.125 MG PO TABS: 3.1250 mg | ORAL_TABLET | Freq: Two times a day (BID) | ORAL | 3 refills | Status: DC

## 2019-01-22 MED ORDER — QUINAPRIL HCL 20 MG PO TABS: 20.0000 mg | ORAL_TABLET | Freq: Every day | ORAL | 3 refills | Status: DC

## 2019-01-22 MED ORDER — ISOSORBIDE MONONITRATE ER 30 MG PO TB24: 30.0000 mg | ORAL_TABLET | Freq: Every day | ORAL | 3 refills | Status: DC

## 2019-01-22 NOTE — Progress Notes (Signed)
Virtual Visit via Telephone Note   This visit type was conducted due to national recommendations for restrictions regarding the COVID-19 Pandemic (e.g. social distancing) in an effort to limit this patient's exposure and mitigate transmission in our community.  Due to her co-morbid illnesses, this patient is at least at moderate risk for complications without adequate follow up.  This format is felt to be most appropriate for this patient at this time.  The patient did not have access to video technology/had technical difficulties with video requiring transitioning to audio format only (telephone).  All issues noted in this document were discussed and addressed.  No physical exam could be performed with this format.  Please refer to the patient's chart for her  consent to telehealth for [facility].   Evaluation Performed:  Follow-up visit  Date:  01/22/2019   ID:  Melissa Waters, DOB 07/06/1951, MRN [id number]  Patient Location: Home Provider Location: Home  PCP:  Daisy Florooss, Charles Alan, MD  Cardiologist:  Tobias AlexanderKatarina Yelitza Reach, MD    Chief Complaint:  No new complaints, 6 months follow up  History of Present Illness:    Melissa Waters is a 68 y.o. female with history of hypertension, family history of CAD, tobacco abuse, abnormal EKG and history of chest pain.  Lexiscan Myoview in 2016- for ischemia.  Saw Dr. Delton SeeNelson 02/2018 and reluctant to quit smoking.  Chest CT for lung cancer screening 01/2018 showed coronary atherosclerosis in the LAD.  01/22/2019 - 6 months follow up, no more chest pains, she walks daily without symptoms. No SOB or DOE. Follows with Dr Tenny Crawoss as her PCP, he follows her labs as well, next lab appt scheduled for May 2020. She undergoes yearly low dose chest CT for lung cancer screening, last in April 2019 with no cancer and evidence of coronary calcifications. She has tried statins before but developed cramps every time.  The patient does not have symptoms concerning for  COVID-19 infection (fever, chills, cough, or new shortness of breath).    Past Medical History:  Diagnosis Date  . COPD (chronic obstructive pulmonary disease) (HCC)   . Diabetes mellitus without complication (HCC)    no meds   . Diverticulosis    Hx: of  . Heart murmur    as a baby  . Hypertension   . Nephritis    Hx: of 1958  . Osteoarthritis    right hip  . Osteoarthritis of left hip 08/13/2013   Past Surgical History:  Procedure Laterality Date  . COLONOSCOPY W/ POLYPECTOMY  2006  . TOTAL HIP ARTHROPLASTY Right    05/27/2009  . TOTAL HIP ARTHROPLASTY Left 08/13/2013   Procedure: TOTAL HIP ARTHROPLASTY;  Surgeon: Eulas PostJoshua P Landau, MD;  Location: [facility];  Service: Orthopedics;  Laterality: Left;     Current Meds  Medication Sig  . aspirin EC 81 MG tablet Take 1 tablet (81 mg total) by mouth daily.  Marland Kitchen. BIOTIN PO Take 1 tablet by mouth daily.  . Calcium Carbonate-Vitamin D (CALCIUM 600 + D PO) Take 1 tablet by mouth daily.  . Carboxymethylcellul-Glycerin (CLEAR EYES FOR DRY EYES OP) Place 1 drop into both eyes 2 (two) times daily. Clear Eyes for Dry Eyes  . carvedilol (COREG) 3.125 MG tablet Take 3.125 mg by mouth 2 (two) times daily with a meal.  . Cholecalciferol (VITAMIN D3) 5000 units CAPS Take 1 capsule by mouth daily.  . clindamycin (CLEOCIN) 300 MG capsule Take 600 mg by mouth once as needed (  prior to dental work).  Marland Kitchen ibuprofen (ADVIL,MOTRIN) 100 MG tablet Take 200 mg by mouth daily.   . isosorbide mononitrate (IMDUR) 30 MG 24 hr tablet Take 30 mg by mouth daily.  Marland Kitchen MILK THISTLE PO Take 350 mg by mouth daily.  . nitroGLYCERIN (NITROSTAT) 0.4 MG SL tablet Place 1 tablet (0.4 mg total) under the tongue every 5 (five) minutes as needed for chest pain.  Marland Kitchen quinapril (ACCUPRIL) 20 MG tablet Take 20 mg by mouth daily before breakfast.   . vitamin C (ASCORBIC ACID) 500 MG tablet Take 500 mg by mouth daily.     Allergies:   Amoxil [amoxicillin]   Social History   Tobacco  Use  . Smoking status: Current Every Day Smoker    Packs/day: 1.25    Years: 49.00    Pack years: 61.25    Types: Cigarettes  . Smokeless tobacco: Never Used  . Tobacco comment: 5 per day. Smoked 1/2 to 2 ppd x 49 years  Substance Use Topics  . Alcohol use: Yes    Alcohol/week: 0.0 standard drinks    Comment: social  . Drug use: No     Family Hx: The patient's family history includes Breast cancer in her mother; Heart Problems (age of onset: 3) in her father; Heart attack (age of onset: 85) in an other family member; Hypertension in her mother.  ROS:   Please see the history of present illness.    All other systems reviewed and are negative.   Prior CV studies:   The following studies were reviewed today:  TTE: 2015 - Left ventricle: The cavity size was normal. Systolic function was normal. The estimated ejection fraction was in the range of 60% to 65%. Wall motion was normal; there were no regional wall motion abnormalities. Doppler parameters are consistent with abnormal left ventricular relaxation (grade 1 diastolic dysfunction). There was no evidence of elevated ventricular filling pressure by Doppler parameters. - Aortic valve: Trileaflet; normal thickness leaflets. No regurgitation. - Aortic root: The aortic root was normal in size. - Mitral valve: Structurally normal valve. No regurgitation. - Left atrium: The atrium was normal in size. - Right ventricle: Systolic function was normal. - Tricuspid valve: Mild regurgitation. - Pulmonic valve: No regurgitation. - Pulmonary arteries: Systolic pressure was within the normal range.   Labs/Other Tests and Data Reviewed:    EKG:  No ECG reviewed.  Recent Labs: No results found for requested labs within last 8760 hours.   Recent Lipid Panel Lab Results  Component Value Date/Time   CHOL 180 06/12/2013 08:32 AM   TRIG 30.0 06/12/2013 08:32 AM   HDL 95.80 06/12/2013 08:32 AM   CHOLHDL 2  06/12/2013 08:32 AM   LDLCALC 78 06/12/2013 08:32 AM    Wt Readings from Last 3 Encounters:  01/22/19 105 lb (47.6 kg)  10/04/18 105 lb (47.6 kg)  07/19/18 110 lb 9.6 oz (50.2 kg)     Objective:    Vital Signs:  Ht  (1.473 m)   Wt 105 lb (47.6 kg)   BMI 21.95 kg/m    VITAL SIGNS:  reviewed  ASSESSMENT & PLAN:    1. Chest pain, unspecified type   2. Abnormal ECG   3. Essential hypertension   4. Family history of early CAD   5. Tobacco abuse     PLAN:  In order of problems listed above:  Chest pain - resolved  CAD - coronary calcifications - the patient has tried to quit smoking  several times, still smokes 5-6 ci/day. She has tried at least 3 different statins, with side efects.  She is now asymptomatic.  We will refer to the lipid clinic after Covid 19 pandemia is lifted.   Smoking - yearly chest CTs arranged by pulmonary  Essential hypertension - controlled  Family history of CAD - as above  COVID-19 Education: The signs and symptoms of COVID-19 were discussed with the patient and how to seek care for testing (follow up with PCP or arrange E-visit).  The importance of social distancing was discussed today.  Time:   Today, I have spent 22 minutes with the patient with telehealth technology discussing the above problems.     Medication Adjustments/Labs and Tests Ordered: Current medicines are reviewed at length with the patient today.  Concerns regarding medicines are outlined above.   Tests Ordered: No orders of the defined types were placed in this encounter.   Medication Changes: No orders of the defined types were placed in this encounter.   Disposition:  Follow up in 6 month(s). We will arrange refills.  Signed, Tobias Alexander, MD  01/22/2019 2:54 PM    [facility]

## 2019-01-22 NOTE — Patient Instructions (Signed)
Medication Instructions:   Your physician recommends that you continue on your current medications as directed. Please refer to the Current Medication list given to you today.   If you need a refill on your cardiac medications before your next appointment, please call your pharmacy.     Follow-Up: At [facility], you and your health needs are our priority.  As part of our continuing mission to provide you with exceptional heart care, we have created designated Provider Care Teams.  These Care Teams include your primary Cardiologist (physician) and Advanced Practice Providers (APPs -  Physician Assistants and Nurse Practitioners) who all work together to provide you with the care you need, when you need it.  Your physician wants you to follow-up in: 6 MONTHS WITH DR NELSON You will receive a reminder letter in the mail two months in advance. If you don't receive a letter, please call our office to schedule the follow-up appointment.    

## 2019-01-25 ENCOUNTER — Ambulatory Visit: Payer: Medicare Other | Admitting: Cardiology

## 2019-03-15 ENCOUNTER — Other Ambulatory Visit: Payer: Self-pay | Admitting: Acute Care

## 2019-03-15 DIAGNOSIS — Z122 Encounter for screening for malignant neoplasm of respiratory organs: Secondary | ICD-10-CM

## 2019-03-15 DIAGNOSIS — Z87891 Personal history of nicotine dependence: Secondary | ICD-10-CM

## 2019-03-15 DIAGNOSIS — F17210 Nicotine dependence, cigarettes, uncomplicated: Secondary | ICD-10-CM

## 2019-04-25 ENCOUNTER — Telehealth: Payer: Self-pay | Admitting: *Deleted

## 2019-04-25 NOTE — Telephone Encounter (Signed)

## 2019-04-26 ENCOUNTER — Other Ambulatory Visit: Payer: Self-pay

## 2019-04-26 ENCOUNTER — Ambulatory Visit (INDEPENDENT_AMBULATORY_CARE_PROVIDER_SITE_OTHER)
Admission: RE | Admit: 2019-04-26 | Discharge: 2019-04-26 | Disposition: A | Discharge status: Home / Self Care | Payer: Medicare Other | Source: Ambulatory Visit | Attending: Acute Care | Admitting: Acute Care

## 2019-04-26 DIAGNOSIS — Z87891 Personal history of nicotine dependence: Secondary | ICD-10-CM

## 2019-04-26 DIAGNOSIS — F17210 Nicotine dependence, cigarettes, uncomplicated: Secondary | ICD-10-CM

## 2019-04-26 DIAGNOSIS — Z122 Encounter for screening for malignant neoplasm of respiratory organs: Secondary | ICD-10-CM

## 2019-05-07 ENCOUNTER — Other Ambulatory Visit: Payer: Self-pay | Admitting: *Deleted

## 2019-05-07 DIAGNOSIS — Z122 Encounter for screening for malignant neoplasm of respiratory organs: Secondary | ICD-10-CM

## 2019-05-07 DIAGNOSIS — F17210 Nicotine dependence, cigarettes, uncomplicated: Secondary | ICD-10-CM

## 2019-05-07 DIAGNOSIS — Z87891 Personal history of nicotine dependence: Secondary | ICD-10-CM

## 2019-07-17 ENCOUNTER — Other Ambulatory Visit: Payer: Self-pay | Admitting: Family Medicine

## 2019-07-17 DIAGNOSIS — Z1231 Encounter for screening mammogram for malignant neoplasm of breast: Secondary | ICD-10-CM

## 2019-07-17 DIAGNOSIS — E2839 Other primary ovarian failure: Secondary | ICD-10-CM

## 2019-09-18 ENCOUNTER — Ambulatory Visit: Payer: Medicare Other | Admitting: Cardiology

## 2019-09-18 ENCOUNTER — Other Ambulatory Visit: Payer: Self-pay

## 2019-09-18 VITALS — BP 120/72 | HR 64 | Ht 59.0 in | Wt 110.8 lb

## 2019-09-18 DIAGNOSIS — Z720 Tobacco use: Secondary | ICD-10-CM | POA: Diagnosis not present

## 2019-09-18 DIAGNOSIS — I2510 Atherosclerotic heart disease of native coronary artery without angina pectoris: Secondary | ICD-10-CM

## 2019-09-18 DIAGNOSIS — R9431 Abnormal electrocardiogram [ECG] [EKG]: Secondary | ICD-10-CM | POA: Diagnosis not present

## 2019-09-18 DIAGNOSIS — Z789 Other specified health status: Secondary | ICD-10-CM

## 2019-09-18 DIAGNOSIS — I10 Essential (primary) hypertension: Secondary | ICD-10-CM

## 2019-09-18 NOTE — Patient Instructions (Signed)
Medication Instructions:   *If you need a refill on your cardiac medications before your next appointment, please call your pharmacy*  Lab Work:  If you have labs (blood work) drawn today and your tests are completely normal, you will receive your results only by: Marland Kitchen MyChart Message (if you have MyChart) OR . A paper copy in the mail If you have any lab test that is abnormal or we need to change your treatment, we will call you to review the results.  Testing/Procedures: None ordered today.  Follow-Up: At [facility], you and your health needs are our priority.  As part of our continuing mission to provide you with exceptional heart care, we have created designated Provider Care Teams.  These Care Teams include your primary Cardiologist (physician) and Advanced Practice Providers (APPs -  Physician Assistants and Nurse Practitioners) who all work together to provide you with the care you need, when you need it.  Your next appointment:   6 month(s)  The format for your next appointment:   In Person  Provider:   You may see Ena Dawley, MD or one of the following Advanced Practice Providers on your designated Care Team:    Melina Copa, PA-C  Ermalinda Barrios, PA-C  You have been referred to lipid clinic.

## 2019-09-18 NOTE — Progress Notes (Signed)
Cardiology Office Note:    Date:  09/18/2019   ID:  ALBERTO SCHOCH, DOB 11-13-1950, MRN [id number]  PCP:  [name], MD  Cardiologist:  Ena Dawley, MD  Electrophysiologist:  None   Referring MD: [name], MD   Reason for visit: 6 months follow-up  History of Present Illness:    Melissa Waters is a 68 y.o. female with a hx of  hypertension, family history of CAD, tobacco abuse, abnormal EKG and history of chest pain.  Lexiscan Myoview in 2016- for ischemia.   Chest CT for lung cancer screening 01/2018 showed coronary atherosclerosis in the LAD.  She is coming after 6 months, she is doing well, she had follow-up lung screening chest CT with stable lung nodules,, can we could see coronary calcifications.  She is unable to take statins as she developed significant myalgias.  She denies any chest pain, she is stable shortness of breath on moderate exertion.  Her labs are being followed by Dr. Harrington Challenger.  We will obtain.   Past Medical History:  Diagnosis Date  . COPD (chronic obstructive pulmonary disease) ([place])   . Diabetes mellitus without complication (HCC)    no meds   . Diverticulosis    Hx: of  . Heart murmur    as a baby  . Hypertension   . Nephritis    Hx: of 1958  . Osteoarthritis    right hip  . Osteoarthritis of left hip 08/13/2013    Past Surgical History:  Procedure Laterality Date  . COLONOSCOPY W/ POLYPECTOMY  2006  . TOTAL HIP ARTHROPLASTY Right    05/27/2009  . TOTAL HIP ARTHROPLASTY Left 08/13/2013   Procedure: TOTAL HIP ARTHROPLASTY;  Surgeon: Johnny Bridge, MD;  Location: [facility];  Service: Orthopedics;  Laterality: Left;    Current Medications: Current Meds  Medication Sig  . Carboxymethylcellul-Glycerin (CLEAR EYES FOR DRY EYES OP) Place 1 drop into both eyes 2 (two) times daily. Clear Eyes for Dry Eyes  . carvedilol (COREG) 3.125 MG tablet Take 1 tablet (3.125 mg total) by mouth 2 (two) times daily with a meal.  . Cholecalciferol  (VITAMIN D3) 5000 units CAPS Take 1 capsule by mouth daily.  . clindamycin (CLEOCIN) 300 MG capsule Take 600 mg by mouth once as needed (prior to dental work).  Marland Kitchen ibuprofen (ADVIL,MOTRIN) 100 MG tablet Take 200 mg by mouth daily.   . isosorbide mononitrate (IMDUR) 30 MG 24 hr tablet Take 1 tablet (30 mg total) by mouth daily.  Marland Kitchen MILK THISTLE PO Take 350 mg by mouth daily.  . nitroGLYCERIN (NITROSTAT) 0.4 MG SL tablet Place 1 tablet (0.4 mg total) under the tongue every 5 (five) minutes as needed for chest pain.  Marland Kitchen quinapril (ACCUPRIL) 20 MG tablet Take 1 tablet (20 mg total) by mouth daily before breakfast.  . vitamin C (ASCORBIC ACID) 500 MG tablet Take 500 mg by mouth daily.     Allergies:   Amoxil [amoxicillin]   Social History   Socioeconomic History  . Marital status: Married    Spouse name: Not on file  . Number of children: Not on file  . Years of education: Not on file  . Highest education level: Not on file  Occupational History  . Not on file  Tobacco Use  . Smoking status: Current Every Day Smoker    Packs/day: 1.25    Years: 49.00    Pack years: 61.25    Types: Cigarettes  . Smokeless  tobacco: Never Used  . Tobacco comment: 5 per day. Smoked 1/2 to 2 ppd x 49 years  Substance and Sexual Activity  . Alcohol use: Yes    Alcohol/week: 0.0 standard drinks    Comment: social  . Drug use: No  . Sexual activity: Not on file  Other Topics Concern  . Not on file  Social History Narrative  . Not on file   Social Determinants of Health   Financial Resource Strain:   . Difficulty of Paying Living Expenses: Not on file  Food Insecurity:   . Worried About Programme researcher, broadcasting/film/videounning Out of Food in the Last Year: Not on file  . Ran Out of Food in the Last Year: Not on file  Transportation Needs:   . Lack of Transportation (Medical): Not on file  . Lack of Transportation (Non-Medical): Not on file  Physical Activity:   . Days of Exercise per Week: Not on file  . Minutes of Exercise per  Session: Not on file  Stress:   . Feeling of Stress : Not on file  Social Connections:   . Frequency of Communication with Friends and Family: Not on file  . Frequency of Social Gatherings with Friends and Family: Not on file  . Attends Religious Services: Not on file  . Active Member of Clubs or Organizations: Not on file  . Attends BankerClub or Organization Meetings: Not on file  . Marital Status: Not on file     Family History: The patient's family history includes Breast cancer in her mother; Heart Problems (age of onset: 4660) in her father; Heart attack (age of onset: 7860) in an other family member; Hypertension in her mother.  ROS:   Please see the history of present illness.    All other systems reviewed and are negative.  EKGs/Labs/Other Studies Reviewed:    The following studies were reviewed today:  EKG:  EKG is not ordered today.  The ekg performed at Dr. Charlott Rakesoss's office on July 11, 2018 shows normal sinus rhythm with negative T waves in leads V3 through V5, unchanged from prior.  Recent Labs: No results found for requested labs within last 8760 hours.  Recent Lipid Panel    Component Value Date/Time   CHOL 180 06/12/2013 0832   TRIG 30.0 06/12/2013 0832   HDL 95.80 06/12/2013 0832   CHOLHDL 2 06/12/2013 0832   VLDL 6.0 06/12/2013 0832   LDLCALC 78 06/12/2013 0832    Physical Exam:    VS:  BP 120/72   Pulse 64   Ht 4\' 11"  (1.499 m)   Wt 110 lb 12.8 oz (50.3 kg)   SpO2 99%   BMI 22.38 kg/m     Wt Readings from Last 3 Encounters:  09/18/19 110 lb 12.8 oz (50.3 kg)  01/22/19 105 lb (47.6 kg)  10/04/18 105 lb (47.6 kg)     GEN:  Well nourished, well developed in no acute distress HEENT: Normal NECK: No JVD; No carotid bruits LYMPHATICS: No lymphadenopathy CARDIAC: RRR, no murmurs, rubs, gallops RESPIRATORY:  Clear to auscultation without rales, wheezing or rhonchi  ABDOMEN: Soft, non-tender, non-distended MUSCULOSKELETAL:  No edema; No deformity  SKIN:  Warm and dry NEUROLOGIC:  Alert and oriented x 3 PSYCHIATRIC:  Normal affect   ASSESSMENT:    1. Coronary artery calcification seen on CT scan    PLAN:    In order of problems listed above:  Chest pain - resolved  CAD - coronary calcifications - the patient has tried to  quit smoking several times, still smokes 5-6 ci/day. She has tried at least 3 different statins, with side efects.  We will refer to the lipid clinic for   Smoking - yearly chest CTs arranged by pulmonary  Essential hypertension - controlled  Medication Adjustments/Labs and Tests Ordered: Current medicines are reviewed at length with the patient today.  Concerns regarding medicines are outlined above.  Orders Placed This Encounter  Procedures  . AMB Referral to Advanced Lipid Disorders Clinic   No orders of the defined types were placed in this encounter.   Patient Instructions  Medication Instructions:   *If you need a refill on your cardiac medications before your next appointment, please call your pharmacy*  Lab Work:  If you have labs (blood work) drawn today and your tests are completely normal, you will receive your results only by: Marland Kitchen MyChart Message (if you have MyChart) OR . A paper copy in the mail If you have any lab test that is abnormal or we need to change your treatment, we will call you to review the results.  Testing/Procedures: None ordered today.  Follow-Up: At [facility], you and your health needs are our priority.  As part of our continuing mission to provide you with exceptional heart care, we have created designated Provider Care Teams.  These Care Teams include your primary Cardiologist (physician) and Advanced Practice Providers (APPs -  Physician Assistants and Nurse Practitioners) who all work together to provide you with the care you need, when you need it.  Your next appointment:   6 month(s)  The format for your next appointment:   In Person  Provider:   You may  see Tobias Alexander, MD or one of the following Advanced Practice Providers on your designated Care Team:    Ronie Spies, PA-C  Jacolyn Reedy, PA-C  You have been referred to lipid clinic.     Signed, Tobias Alexander, MD  09/18/2019 12:35 PM    [facility]

## 2019-10-15 NOTE — Progress Notes (Signed)
Patient ID: [name]                 DOB: 02-02-1951                    MRN: [id number]     HPI: Melissa Waters is a 69 y.o. female patient referred to lipid clinic by Dr. Meda Coffee. PMH is significant for hypertension, family history of CAD, tobacco abuse, COPD, T2DM, abnormal EKG and history of chest pain.Lexiscan Myoview in 2016- for ischemia. Chest CT for lung cancer screening 01/2018 showed coronary atherosclerosis in the LAD. Follow-up lung screening chest CT showed stable lung nodules with coronary calcifications.  Pt presents today in good spirits. She states she has tried pravastatin in the past and experienced muscle aches and cramps. States she took it once a week. Has not tried any other statins. Her husband did take Lipitor and Crestor in the past but did not like taking them either - no major side effects that pt can recall.  Current Medications: none Intolerances: pravastatin once a week - myalgias and cramps Risk Factors: coronary atherosclerosis in LAD, HTN, family history of CAD, tobacco abuse, T2DM LDL goal: < 70 mg/dL  Diet: Cereal, muffin, or fruit for breakfast. Lunch - 1/2 sandwich, chips, and pickles. Dinner - likes steak, salad, fish, pork tenderloin, greens, sweet potatoes.    Exercise: None  Family History: The patient's family history includes Breast cancer in her mother; Heart Problems (age of onset: 82) in her father; Heart attack (age of onset: 57) in an other family member; Hypertension in her mother.  Social History: Patient smokes 5-6 cigarettes a day. No drug use; minimal alcohol use  Labs: 07/12/19: TC 188, TG 62, HDL 98, non-HDL 90, LDL 78 (no lipid lowering therapy)  Past Medical History:  Diagnosis Date  . COPD (chronic obstructive pulmonary disease) ([place])   . Diabetes mellitus without complication (HCC)    no meds   . Diverticulosis    Hx: of  . Heart murmur    as a baby  . Hypertension   . Nephritis    Hx: of 1958  . Osteoarthritis     right hip  . Osteoarthritis of left hip 08/13/2013    Current Outpatient Medications on File Prior to Visit  Medication Sig Dispense Refill  . aspirin EC 81 MG tablet Take 1 tablet (81 mg total) by mouth daily. 90 tablet 3  . BIOTIN PO Take 1 tablet by mouth daily.    . Carboxymethylcellul-Glycerin (CLEAR EYES FOR DRY EYES OP) Place 1 drop into both eyes 2 (two) times daily. Clear Eyes for Dry Eyes    . carvedilol (COREG) 3.125 MG tablet Take 1 tablet (3.125 mg total) by mouth 2 (two) times daily with a meal. 180 tablet 3  . Cholecalciferol (VITAMIN D3) 5000 units CAPS Take 1 capsule by mouth daily.    . clindamycin (CLEOCIN) 300 MG capsule Take 600 mg by mouth once as needed (prior to dental work).    Marland Kitchen ibuprofen (ADVIL,MOTRIN) 100 MG tablet Take 200 mg by mouth daily.     . isosorbide mononitrate (IMDUR) 30 MG 24 hr tablet Take 1 tablet (30 mg total) by mouth daily. 90 tablet 3  . MILK THISTLE PO Take 350 mg by mouth daily.    . nitroGLYCERIN (NITROSTAT) 0.4 MG SL tablet Place 1 tablet (0.4 mg total) under the tongue every 5 (five) minutes as needed for chest pain. 25 tablet 3  .  quinapril (ACCUPRIL) 20 MG tablet Take 1 tablet (20 mg total) by mouth daily before breakfast. 90 tablet 3  . vitamin C (ASCORBIC ACID) 500 MG tablet Take 500 mg by mouth daily.     No current facility-administered medications on file prior to visit.    Allergies  Allergen Reactions  . Amoxil [Amoxicillin] Diarrhea and Nausea And Vomiting    Assessment/Plan:  1. Hyperlipidemia - Baseline LDL on no lipid lowering therapy is 78, close to goal < 70 given history of coronary calcifications. Pt is interested in PCSK9i therapy, discussed that insurance will not cover injections until pt has tried at least 2 statins and she has only tried pravastatin. She is agreeable to trying low dose rosuvastatin 5mg  3 days per week. Encouraged her to start walking 15 minutes daily as well. Will recheck fasting lipids and LFTs in  3 months. Pt advised to call clinic if she has any trouble tolerating therapy.   Megan E. Supple, PharmD, BCACP, CPP [facility] [address] [place], [place] Phone: [phone]; Fax: [phone] 10/16/2019 11:34 AM

## 2019-10-16 ENCOUNTER — Other Ambulatory Visit: Payer: Self-pay

## 2019-10-16 ENCOUNTER — Ambulatory Visit (INDEPENDENT_AMBULATORY_CARE_PROVIDER_SITE_OTHER): Payer: Medicare Other | Admitting: Pharmacist

## 2019-10-16 DIAGNOSIS — Z8249 Family history of ischemic heart disease and other diseases of the circulatory system: Secondary | ICD-10-CM

## 2019-10-16 DIAGNOSIS — E782 Mixed hyperlipidemia: Secondary | ICD-10-CM | POA: Diagnosis not present

## 2019-10-16 DIAGNOSIS — E785 Hyperlipidemia, unspecified: Secondary | ICD-10-CM | POA: Insufficient documentation

## 2019-10-16 MED ORDER — ROSUVASTATIN CALCIUM 5 MG PO TABS: ORAL_TABLET | ORAL | 11 refills | Status: DC

## 2019-10-16 NOTE — Patient Instructions (Addendum)
It was nice to meet you today  Your LDL (bad cholesterol) is 78 and your goal is less than 70  Start taking rosuvastatin (Crestor) 5mg  3 days a week (Monday, Wednesday, and Friday) as tolerated  Try to start walking for 15 minutes most days a week at a brisk pace  Call Kert Shackett with any issues tolerating this #[phone]  Recheck fasting cholesterol in 2-3 months on Monday, April 5th any time after 7:30am

## 2019-10-25 ENCOUNTER — Other Ambulatory Visit: Payer: Self-pay

## 2019-10-25 ENCOUNTER — Ambulatory Visit
Admission: RE | Admit: 2019-10-25 | Discharge: 2019-10-25 | Disposition: A | Discharge status: Home / Self Care | Payer: Medicare Other | Source: Ambulatory Visit | Attending: Family Medicine | Admitting: Family Medicine

## 2019-10-25 DIAGNOSIS — E2839 Other primary ovarian failure: Secondary | ICD-10-CM

## 2019-10-25 DIAGNOSIS — Z1231 Encounter for screening mammogram for malignant neoplasm of breast: Secondary | ICD-10-CM

## 2019-10-27 ENCOUNTER — Ambulatory Visit: Payer: Medicare Other | Attending: Internal Medicine

## 2019-10-27 DIAGNOSIS — Z23 Encounter for immunization: Secondary | ICD-10-CM

## 2019-10-27 NOTE — Progress Notes (Signed)
   Covid-19 Vaccination Clinic  Name:  BRITTINIE WHERLEY    MRN: [id number] DOB: November 29, 1950  10/27/2019  Ms. Kamphuis was observed post Covid-19 immunization for 15 minutes without incidence. She was provided with Vaccine Information Sheet and instruction to access the V-Safe system.   Ms. Portnoy was instructed to call 911 with any severe reactions post vaccine: Marland Kitchen Difficulty breathing  . Swelling of your face and throat  . A fast heartbeat  . A bad rash all over your body  . Dizziness and weakness    Immunizations Administered    Name Date Dose VIS Date Route   Pfizer COVID-19 Vaccine 10/27/2019  3:23 PM 0.3 mL 09/14/2019 Intramuscular   Manufacturer: [employer], [employer]   Lot: [id number]   NDC: [address]-7

## 2019-11-19 ENCOUNTER — Ambulatory Visit: Payer: Medicare Other | Attending: Internal Medicine

## 2019-11-19 DIAGNOSIS — Z23 Encounter for immunization: Secondary | ICD-10-CM | POA: Insufficient documentation

## 2019-11-19 NOTE — Progress Notes (Signed)
   Covid-19 Vaccination Clinic  Name:  Melissa Waters    MRN: [id number] DOB: 03/17/1951  11/19/2019  Melissa Waters was observed post Covid-19 immunization for 15 minutes without incidence. She was provided with Vaccine Information Sheet and instruction to access the V-Safe system.   Melissa Waters was instructed to call 911 with any severe reactions post vaccine: Marland Kitchen Difficulty breathing  . Swelling of your face and throat  . A fast heartbeat  . A bad rash all over your body  . Dizziness and weakness    Immunizations Administered    Name Date Dose VIS Date Route   Pfizer COVID-19 Vaccine 11/19/2019 11:14 AM 0.3 mL 09/14/2019 Intramuscular   Manufacturer: [employer], [employer]   Lot: [id number]   NDC: [id number]

## 2019-12-21 ENCOUNTER — Telehealth: Payer: Self-pay | Admitting: Pharmacist

## 2019-12-21 MED ORDER — EZETIMIBE 10 MG PO TABS: 10.0000 mg | ORAL_TABLET | Freq: Every day | ORAL | 11 refills | Status: DC

## 2019-12-21 NOTE — Telephone Encounter (Signed)
Pt called clinic and reports trouble with confusion and remembering things since starting rosuvastatin 5mg  3 days a week in January. She previously experienced leg cramps on once weekly pravastatin. Will stop rosuvastatin - updated allergy list. She is willing to try ezetimibe 10mg  once daily. Moved f/u labs to 2 months after ezetimibe start.

## 2020-01-07 ENCOUNTER — Other Ambulatory Visit: Payer: Medicare Other

## 2020-01-24 ENCOUNTER — Other Ambulatory Visit: Payer: Self-pay | Admitting: Cardiology

## 2020-02-20 ENCOUNTER — Other Ambulatory Visit: Payer: Medicare Other | Admitting: *Deleted

## 2020-02-20 ENCOUNTER — Other Ambulatory Visit: Payer: Self-pay

## 2020-02-20 DIAGNOSIS — E782 Mixed hyperlipidemia: Secondary | ICD-10-CM

## 2020-02-20 DIAGNOSIS — Z8249 Family history of ischemic heart disease and other diseases of the circulatory system: Secondary | ICD-10-CM

## 2020-02-20 LAB — HEPATIC FUNCTION PANEL
ALT: 14 IU/L (ref 0–32)
AST: 19 IU/L (ref 0–40)
Albumin: 4.4 g/dL (ref 3.8–4.8)
Alkaline Phosphatase: 90 IU/L (ref 48–121)
Bilirubin Total: 0.3 mg/dL (ref 0.0–1.2)
Bilirubin, Direct: 0.11 mg/dL (ref 0.00–0.40)
Total Protein: 6.2 g/dL (ref 6.0–8.5)

## 2020-02-20 LAB — LIPID PANEL
Chol/HDL Ratio: 1.8 ratio (ref 0.0–4.4)
Cholesterol, Total: 152 mg/dL (ref 100–199)
HDL: 84 mg/dL (ref >39)
LDL Chol Calc (NIH): 55 mg/dL (ref 0–99)
Triglycerides: 66 mg/dL (ref 0–149)
VLDL Cholesterol Cal: 13 mg/dL (ref 5–40)

## 2020-02-21 ENCOUNTER — Telehealth: Payer: Self-pay | Admitting: Cardiology

## 2020-02-21 NOTE — Telephone Encounter (Signed)
Melissa Masson, MD  02/20/2020 4:55 PM EDT    Normal LFTs and lipids at goal!    The patient has been notified of the result and verbalized understanding.  All questions (if any) were answered. [name], LPN 02/20/8021 33:61 AM

## 2020-02-21 NOTE — Telephone Encounter (Signed)
Patient returning Ivy's call for lab results.

## 2020-03-22 ENCOUNTER — Other Ambulatory Visit: Payer: Self-pay | Admitting: Cardiology

## 2020-04-14 ENCOUNTER — Encounter: Payer: Self-pay | Admitting: Cardiology

## 2020-04-14 ENCOUNTER — Ambulatory Visit: Payer: Medicare Other | Admitting: Cardiology

## 2020-04-14 ENCOUNTER — Other Ambulatory Visit: Payer: Self-pay

## 2020-04-14 VITALS — BP 120/80 | HR 56 | Ht 59.0 in | Wt 109.2 lb

## 2020-04-14 DIAGNOSIS — R9431 Abnormal electrocardiogram [ECG] [EKG]: Secondary | ICD-10-CM | POA: Diagnosis not present

## 2020-04-14 DIAGNOSIS — Z789 Other specified health status: Secondary | ICD-10-CM

## 2020-04-14 DIAGNOSIS — I10 Essential (primary) hypertension: Secondary | ICD-10-CM

## 2020-04-14 DIAGNOSIS — E782 Mixed hyperlipidemia: Secondary | ICD-10-CM

## 2020-04-14 DIAGNOSIS — I2510 Atherosclerotic heart disease of native coronary artery without angina pectoris: Secondary | ICD-10-CM

## 2020-04-14 DIAGNOSIS — Z8249 Family history of ischemic heart disease and other diseases of the circulatory system: Secondary | ICD-10-CM

## 2020-04-14 NOTE — Progress Notes (Signed)
Cardiology Office Note:    Date:  04/14/2020   ID:  Melissa Waters, DOB 11/26/1950, MRN [id number]  PCP:  Daisy Floro, MD  Cardiologist:  Tobias Alexander, MD  Electrophysiologist:  None   Referring MD: Daisy Floro, MD   Reason for visit: 6 months follow-up  History of Present Illness:    Melissa Waters is a 69 y.o. female with a hx of  hypertension, family history of CAD, tobacco abuse, abnormal EKG and history of chest pain.  Lexiscan Myoview in 2016- for ischemia.   Chest CT for lung cancer screening 01/2018 showed coronary atherosclerosis in the LAD.  09/18/2019 - she is coming after 6 months, she is doing well, she had follow-up lung screening chest CT with stable lung nodules,, can we could see coronary calcifications.  She is unable to take statins as she developed significant myalgias.  She denies any chest pain, she is stable shortness of breath on moderate exertion.  Her labs are being followed by Dr. Tenny Craw.  We will obtain.  04/14/2020 -the patient is coming after 6 months, she has been followed in our lipid clinic for statin intolerance, she is currently taking Zetia tolerating it very well and her most recent LDL was 55.  She remains active, she just celebrated 49th wedding anniversary with her husband and she denies any chest pain or shortness of breath, no lower extremity edema orthopnea proximal nocturnal dyspnea.  She is considering undergoing right foot surgery.  Past Medical History:  Diagnosis Date  . COPD (chronic obstructive pulmonary disease) (HCC)   . Diabetes mellitus without complication (HCC)    no meds   . Diverticulosis    Hx: of  . Heart murmur    as a baby  . Hypertension   . Nephritis    Hx: of 1958  . Osteoarthritis    right hip  . Osteoarthritis of left hip 08/13/2013    Past Surgical History:  Procedure Laterality Date  . COLONOSCOPY W/ POLYPECTOMY  2006  . TOTAL HIP ARTHROPLASTY Right    05/27/2009  . TOTAL HIP ARTHROPLASTY Left  08/13/2013   Procedure: TOTAL HIP ARTHROPLASTY;  Surgeon: Eulas Post, MD;  Location: [facility] OR;  Service: Orthopedics;  Laterality: Left;    Current Medications: Current Meds  Medication Sig  . aspirin EC 81 MG tablet Take 1 tablet (81 mg total) by mouth daily.  Marland Kitchen BIOTIN PO Take 1 tablet by mouth daily.  . Carboxymethylcellul-Glycerin (CLEAR EYES FOR DRY EYES OP) Place 1 drop into both eyes 2 (two) times daily. Clear Eyes for Dry Eyes  . carvedilol (COREG) 3.125 MG tablet TAKE 1 TABLET (3.125 MG TOTAL) BY MOUTH 2 (TWO) TIMES DAILY WITH A MEAL.  Marland Kitchen Cholecalciferol (VITAMIN D3) 5000 units CAPS Take 1 capsule by mouth daily.  Marland Kitchen ezetimibe (ZETIA) 10 MG tablet Take 1 tablet (10 mg total) by mouth daily.  Marland Kitchen ibuprofen (ADVIL,MOTRIN) 100 MG tablet Take 200 mg by mouth daily.   . isosorbide mononitrate (IMDUR) 30 MG 24 hr tablet TAKE 1 TABLET BY MOUTH EVERY DAY  . MILK THISTLE PO Take 350 mg by mouth daily.  . nitroGLYCERIN (NITROSTAT) 0.4 MG SL tablet Place 1 tablet (0.4 mg total) under the tongue every 5 (five) minutes as needed for chest pain.  . Probiotic Product (PHILLIPS COLON HEALTH PO) Take 1 capsule by mouth daily.  . quinapril (ACCUPRIL) 20 MG tablet Take 1 tablet (20 mg total) by mouth daily before breakfast.  .  vitamin C (ASCORBIC ACID) 500 MG tablet Take 500 mg by mouth daily.     Allergies:   Amoxil [amoxicillin], Pravastatin, and Rosuvastatin   Social History   Socioeconomic History  . Marital status: Married    Spouse name: Not on file  . Number of children: Not on file  . Years of education: Not on file  . Highest education level: Not on file  Occupational History  . Not on file  Tobacco Use  . Smoking status: Current Every Day Smoker    Packs/day: 1.25    Years: 49.00    Pack years: 61.25    Types: Cigarettes  . Smokeless tobacco: Never Used  . Tobacco comment: 5 per day. Smoked 1/2 to 2 ppd x 49 years  Substance and Sexual Activity  . Alcohol use: Yes     Alcohol/week: 0.0 standard drinks    Comment: social  . Drug use: No  . Sexual activity: Not on file  Other Topics Concern  . Not on file  Social History Narrative  . Not on file   Social Determinants of Health   Financial Resource Strain:   . Difficulty of Paying Living Expenses:   Food Insecurity:   . Worried About Programme researcher, broadcasting/film/video in the Last Year:   . Barista in the Last Year:   Transportation Needs:   . Freight forwarder (Medical):   Marland Kitchen Lack of Transportation (Non-Medical):   Physical Activity:   . Days of Exercise per Week:   . Minutes of Exercise per Session:   Stress:   . Feeling of Stress :   Social Connections:   . Frequency of Communication with Friends and Family:   . Frequency of Social Gatherings with Friends and Family:   . Attends Religious Services:   . Active Member of Clubs or Organizations:   . Attends Banker Meetings:   Marland Kitchen Marital Status:      Family History: The patient's family history includes Breast cancer in her mother; Heart Problems (age of onset: 61) in her father; Heart attack (age of onset: 41) in an other family member; Hypertension in her mother.  ROS:   Please see the history of present illness.    All other systems reviewed and are negative.  EKGs/Labs/Other Studies Reviewed:    The following studies were reviewed today:  EKG: EKG performed today April 14, 2020 shows normal sinus rhythm, 63 bpm, there are nonspecific ST-T wave abnormalities with mild negative T waves in the inferolateral leads, however these are unchanged from prior EKG.  This was personally reviewed.    Recent Labs: 02/20/2020: ALT 14  Recent Lipid Panel    Component Value Date/Time   CHOL 152 02/20/2020 0829   TRIG 66 02/20/2020 0829   HDL 84 02/20/2020 0829   CHOLHDL 1.8 02/20/2020 0829   CHOLHDL 2 06/12/2013 0832   VLDL 6.0 06/12/2013 0832   LDLCALC 55 02/20/2020 0829    Physical Exam:    VS:  BP 120/80   Pulse (!) 56   Ht  4\' 11"  (1.499 m)   Wt 109 lb 3.2 oz (49.5 kg)   SpO2 95%   BMI 22.06 kg/m     Wt Readings from Last 3 Encounters:  04/14/20 109 lb 3.2 oz (49.5 kg)  09/18/19 110 lb 12.8 oz (50.3 kg)  01/22/19 105 lb (47.6 kg)    GEN:  Well nourished, well developed in no acute distress HEENT: Normal NECK: No  JVD; No carotid bruits LYMPHATICS: No lymphadenopathy CARDIAC: RRR, no murmurs, rubs, gallops RESPIRATORY:  Clear to auscultation without rales, wheezing or rhonchi  ABDOMEN: Soft, non-tender, non-distended MUSCULOSKELETAL:  No edema; No deformity  SKIN: Warm and dry NEUROLOGIC:  Alert and oriented x 3 PSYCHIATRIC:  Normal affect   ASSESSMENT:    1. Family history of early CAD   2. Coronary artery calcification seen on CT scan   3. Abnormal ECG   4. Essential hypertension   5. Mixed hyperlipidemia   6. Statin intolerance    PLAN:    In order of problems listed above:  Chest pain - resolved while she remains active, now tolerating Zetia and aspirin.  CAD - coronary calcifications - the patient has tried to quit smoking several times, still smokes 5-6 ci/day. She has tried at least 3 different statins, with side effects, now tolerating Zetia.  Her LDL is at goal currently 55.  Smoking - yearly chest CTs arranged by pulmonary  Essential hypertension - controlled  Hyperlipidemia - as above.  Medication Adjustments/Labs and Tests Ordered: Current medicines are reviewed at length with the patient today.  Concerns regarding medicines are outlined above.  Orders Placed This Encounter  Procedures  . EKG 12-Lead   No orders of the defined types were placed in this encounter.   Patient Instructions  Medication Instructions:  Your physician recommends that you continue on your current medications as directed. Please refer to the Current Medication list given to you today.  *If you need a refill on your cardiac medications before your next appointment, please call your  pharmacy*   Follow-Up: At [facility], you and your health needs are our priority.  As part of our continuing mission to provide you with exceptional heart care, we have created designated Provider Care Teams.  These Care Teams include your primary Cardiologist (physician) and Advanced Practice Providers (APPs -  Physician Assistants and Nurse Practitioners) who all work together to provide you with the care you need, when you need it.  We recommend signing up for the patient portal called "MyChart".  Sign up information is provided on this After Visit Summary.  MyChart is used to connect with patients for Virtual Visits (Telemedicine).  Patients are able to view lab/test results, encounter notes, upcoming appointments, etc.  Non-urgent messages can be sent to your provider as well.   To learn more about what you can do with MyChart, go to [email or web address].    Your next appointment:   6 month(s)  The format for your next appointment:   In Person  Provider:   You may see Tobias Alexander, MD or one of the following Advanced Practice Providers on your designated Care Team:    Ronie Spies, PA-C  Jacolyn Reedy, PA-C        Signed, Tobias Alexander, MD  04/14/2020 10:10 AM    [facility]

## 2020-04-14 NOTE — Patient Instructions (Signed)
Medication Instructions:  Your physician recommends that you continue on your current medications as directed. Please refer to the Current Medication list given to you today.  *If you need a refill on your cardiac medications before your next appointment, please call your pharmacy*   Follow-Up: At [facility], you and your health needs are our priority.  As part of our continuing mission to provide you with exceptional heart care, we have created designated Provider Care Teams.  These Care Teams include your primary Cardiologist (physician) and Advanced Practice Providers (APPs -  Physician Assistants and Nurse Practitioners) who all work together to provide you with the care you need, when you need it.  We recommend signing up for the patient portal called "MyChart".  Sign up information is provided on this After Visit Summary.  MyChart is used to connect with patients for Virtual Visits (Telemedicine).  Patients are able to view lab/test results, encounter notes, upcoming appointments, etc.  Non-urgent messages can be sent to your provider as well.   To learn more about what you can do with MyChart, go to [email or web address].    Your next appointment:   6 month(s)  The format for your next appointment:   In Person  Provider:   You may see Tobias Alexander, MD or one of the following Advanced Practice Providers on your designated Care Team:    Ronie Spies, PA-C  Jacolyn Reedy, PA-C

## 2020-05-05 ENCOUNTER — Ambulatory Visit (INDEPENDENT_AMBULATORY_CARE_PROVIDER_SITE_OTHER)
Admission: RE | Admit: 2020-05-05 | Discharge: 2020-05-05 | Disposition: A | Discharge status: Home / Self Care | Payer: Medicare Other | Source: Ambulatory Visit | Attending: Acute Care | Admitting: Acute Care

## 2020-05-05 ENCOUNTER — Other Ambulatory Visit: Payer: Self-pay

## 2020-05-05 DIAGNOSIS — Z122 Encounter for screening for malignant neoplasm of respiratory organs: Secondary | ICD-10-CM

## 2020-05-05 DIAGNOSIS — F17210 Nicotine dependence, cigarettes, uncomplicated: Secondary | ICD-10-CM

## 2020-05-05 DIAGNOSIS — Z87891 Personal history of nicotine dependence: Secondary | ICD-10-CM | POA: Diagnosis not present

## 2020-05-12 NOTE — Progress Notes (Signed)
Please call patient and let them  know their  low dose Ct was read as a Lung RADS 2: nodules that are benign in appearance and behavior with a very low likelihood of becoming a clinically active cancer due to size or lack of growth. Recommendation per radiology is for a repeat LDCT in 12 months. .Please let them  know we will order and schedule their  annual screening scan for 05/2021 Please let them  know there was notation of CAD on their  scan.  Please remind the patient  that this is a non-gated exam therefore degree or severity of disease  cannot be determined. Please have them  follow up with their PCP regarding potential risk factor modification, dietary therapy or pharmacologic therapy if clinically indicated. Pt.  is not  currently on statin therapy. Please place order for annual  screening scan for  05/2021 and fax results to PCP. Thanks so much.  

## 2020-05-13 ENCOUNTER — Other Ambulatory Visit: Payer: Self-pay | Admitting: *Deleted

## 2020-05-13 DIAGNOSIS — F17210 Nicotine dependence, cigarettes, uncomplicated: Secondary | ICD-10-CM

## 2020-05-13 DIAGNOSIS — Z87891 Personal history of nicotine dependence: Secondary | ICD-10-CM

## 2020-08-02 ENCOUNTER — Ambulatory Visit: Payer: Medicare Other | Attending: Internal Medicine

## 2020-08-02 DIAGNOSIS — Z23 Encounter for immunization: Secondary | ICD-10-CM

## 2020-08-02 NOTE — Progress Notes (Signed)
° °  Covid-19 Vaccination Clinic  Name:  Melissa Waters    MRN: [id number] DOB: 1951/01/19  08/02/2020  Melissa Waters was observed post Covid-19 immunization for 15 minutes without incident. She was provided with Vaccine Information Sheet and instruction to access the V-Safe system.   Melissa Waters was instructed to call 911 with any severe reactions post vaccine:  Difficulty breathing   Swelling of face and throat   A fast heartbeat   A bad rash all over body   Dizziness and weakness

## 2020-09-17 ENCOUNTER — Other Ambulatory Visit: Payer: Self-pay | Admitting: Cardiology

## 2020-10-13 ENCOUNTER — Other Ambulatory Visit: Payer: Self-pay | Admitting: Cardiology

## 2020-10-15 DIAGNOSIS — J449 Chronic obstructive pulmonary disease, unspecified: Secondary | ICD-10-CM | POA: Diagnosis not present

## 2020-10-15 DIAGNOSIS — M8580 Other specified disorders of bone density and structure, unspecified site: Secondary | ICD-10-CM | POA: Diagnosis not present

## 2020-10-15 DIAGNOSIS — I10 Essential (primary) hypertension: Secondary | ICD-10-CM | POA: Diagnosis not present

## 2020-10-15 DIAGNOSIS — E1169 Type 2 diabetes mellitus with other specified complication: Secondary | ICD-10-CM | POA: Diagnosis not present

## 2020-10-15 DIAGNOSIS — E782 Mixed hyperlipidemia: Secondary | ICD-10-CM | POA: Diagnosis not present

## 2020-10-21 ENCOUNTER — Other Ambulatory Visit: Payer: Self-pay | Admitting: Cardiology

## 2020-10-28 ENCOUNTER — Other Ambulatory Visit: Payer: Self-pay | Admitting: Family Medicine

## 2020-10-28 DIAGNOSIS — Z1231 Encounter for screening mammogram for malignant neoplasm of breast: Secondary | ICD-10-CM

## 2020-11-14 DIAGNOSIS — M2041 Other hammer toe(s) (acquired), right foot: Secondary | ICD-10-CM | POA: Diagnosis not present

## 2020-11-14 DIAGNOSIS — L84 Corns and callosities: Secondary | ICD-10-CM | POA: Diagnosis not present

## 2020-12-03 NOTE — Progress Notes (Unsigned)
Cardiology Office Note:    Date:  12/05/2020   ID:  Melissa Waters, DOB 10-17-1950, MRN [id number]  PCP:  Daisy Floro, MD   [facility]  Cardiologist:  Tobias Alexander, MD  Advanced Practice Provider:  No care team member to display Electrophysiologist:  None    Referring MD: Daisy Floro, MD    History of Present Illness:    Melissa Waters is a 70 y.o. female with a hx of HTN, family history of CAD, tobacco abuse, coronary calcification and tobacco abuse who was previously seen by Dr. Delton See who returns to clinic for follow-up.   Last saw Dr. Delton See in 04/2020. She had been following in lipid clinic for statin intolerance currently on zetia. She was active and asymptomatic at that time. Lexiscan 2016 negative for ischemia. Chest CT for lung cancer screening 01/2018 showed coronary atherosclerosis in the LAD.  Patient states that she feels overall well. Has chronic right foot pain that is followed by orthopedics for years, which has made it harder for her to walk. She denies any chest pain, SOB, LE edema, orthopnea, PND. Walks around the house a lot without issues. Has nitroglycerin at home for intermittent chest pain and she has used it once in the past year.   Past Medical History:  Diagnosis Date  . COPD (chronic obstructive pulmonary disease) (HCC)   . Diabetes mellitus without complication (HCC)    no meds   . Diverticulosis    Hx: of  . Heart murmur    as a baby  . Hypertension   . Nephritis    Hx: of 1958  . Osteoarthritis    right hip  . Osteoarthritis of left hip 08/13/2013    Past Surgical History:  Procedure Laterality Date  . COLONOSCOPY W/ POLYPECTOMY  2006  . TOTAL HIP ARTHROPLASTY Right    05/27/2009  . TOTAL HIP ARTHROPLASTY Left 08/13/2013   Procedure: TOTAL HIP ARTHROPLASTY;  Surgeon: Eulas Post, MD;  Location: [facility] OR;  Service: Orthopedics;  Laterality: Left;    Current Medications: Current Meds  Medication  Sig  . aspirin EC 81 MG tablet Take 1 tablet (81 mg total) by mouth daily.  Marland Kitchen BIOTIN PO Take 1 tablet by mouth daily.  . Carboxymethylcellul-Glycerin (CLEAR EYES FOR DRY EYES OP) Place 1 drop into both eyes 2 (two) times daily. Clear Eyes for Dry Eyes  . carvedilol (COREG) 3.125 MG tablet TAKE 1 TABLET BY MOUTH TWICE A DAY WITH MEALS  . Cholecalciferol (VITAMIN D3) 5000 units CAPS Take 1 capsule by mouth daily.  Marland Kitchen ezetimibe (ZETIA) 10 MG tablet TAKE 1 TABLET BY MOUTH EVERY DAY  . ibuprofen (ADVIL,MOTRIN) 100 MG tablet Take 200 mg by mouth daily.  . isosorbide mononitrate (IMDUR) 30 MG 24 hr tablet TAKE 1 TABLET BY MOUTH EVERY DAY  . MILK THISTLE PO Take 350 mg by mouth daily.  . Probiotic Product (PHILLIPS COLON HEALTH PO) Take 1 capsule by mouth daily.  . quinapril (ACCUPRIL) 20 MG tablet Take 1 tablet (20 mg total) by mouth daily before breakfast.  . vitamin C (ASCORBIC ACID) 500 MG tablet Take 500 mg by mouth daily.  . [DISCONTINUED] nitroGLYCERIN (NITROSTAT) 0.4 MG SL tablet Place 1 tablet (0.4 mg total) under the tongue every 5 (five) minutes as needed for chest pain.     Allergies:   Amoxil [amoxicillin], Metformin, Pravastatin, and Rosuvastatin   Social History   Socioeconomic History  . Marital status:  Married    Spouse name: Not on file  . Number of children: Not on file  . Years of education: Not on file  . Highest education level: Not on file  Occupational History  . Not on file  Tobacco Use  . Smoking status: Current Every Day Smoker    Packs/day: 1.25    Years: 49.00    Pack years: 2060/02/03    Types: Cigarettes  . Smokeless tobacco: Never Used  . Tobacco comment: 5 per day. Smoked 1/2 to 2 ppd x 49 years  Substance and Sexual Activity  . Alcohol use: Yes    Alcohol/week: 0.0 standard drinks    Comment: social  . Drug use: No  . Sexual activity: Not on file  Other Topics Concern  . Not on file  Social History Narrative  . Not on file   Social Determinants of  Health   Financial Resource Strain: Not on file  Food Insecurity: Not on file  Transportation Needs: Not on file  Physical Activity: Not on file  Stress: Not on file  Social Connections: Not on file     Family History: The patient's family history includes Breast cancer in her mother; Heart Problems (age of onset: 31) in her father; Heart attack (age of onset: 29) in an other family member; Hypertension in her mother.  ROS:   Please see the history of present illness.    Review of Systems  Constitutional: Negative for chills, diaphoresis and fever.  HENT: Negative for hearing loss.   Eyes: Negative for blurred vision and redness.  Respiratory: Negative for shortness of breath.   Cardiovascular: Negative for chest pain, palpitations, orthopnea, claudication, leg swelling and PND.  Gastrointestinal: Negative for heartburn and melena.  Genitourinary: Negative for dysuria and flank pain.  Musculoskeletal: Positive for joint pain and myalgias.  Neurological: Negative for dizziness and loss of consciousness.  Endo/Heme/Allergies: Negative for polydipsia.  Psychiatric/Behavioral: The patient is not nervous/anxious.     EKGs/Labs/Other Studies Reviewed:    The following studies were reviewed today: TTE Feb 02, 2014: Study Conclusions   - Left ventricle: The cavity size was normal. Systolic  function was normal. The estimated ejection fraction was  in the range of 60% to 65%. Wall motion was normal; there  were no regional wall motion abnormalities. Doppler  parameters are consistent with abnormal left ventricular  relaxation (grade 1 diastolic dysfunction). There was no  evidence of elevated ventricular filling pressure by  Doppler parameters.  - Aortic valve: Trileaflet; normal thickness leaflets. No  regurgitation.  - Aortic root: The aortic root was normal in size.  - Mitral valve: Structurally normal valve. No regurgitation.  - Left atrium: The atrium was normal in  size.  - Right ventricle: Systolic function was normal.  - Tricuspid valve: Mild regurgitation.  - Pulmonic valve: No regurgitation.  - Pulmonary arteries: Systolic pressure was within the  normal range.   CT Lung Cancer Screen 05/2020: FINDINGS: Cardiovascular: Heart is normal in size.  No pericardial effusion.  Ectasia of the ascending thoracic aorta, measuring 3.6 cm (coronal image 32). Atherosclerotic calcifications of the aortic arch.  Coronary atherosclerosis of the LAD.  Mediastinum/Nodes: No suspicious mediastinal lymphadenopathy.  Visualized thyroid is unremarkable.  Lungs/Pleura: Mild biapical pleural-parenchymal scarring.  Mild subpleural reticulation/fibrosis in the bilateral lower lobes.  No focal consolidation.  3.5 mm triangular subpleural nodule inferiorly in the right upper lobe, benign.  No pleural effusion or pneumothorax.  Upper Abdomen: Visualized upper abdomen is grossly unremarkable.  Musculoskeletal: Degenerative changes of the visualized thoracolumbar spine.  IMPRESSION: Lung-RADS 2, benign appearance or behavior. Continue annual screening with low-dose chest CT without contrast in 12 months.  Aortic Atherosclerosis (ICD10-I70.0).   Recent Labs: 02/20/2020: ALT 14  Recent Lipid Panel    Component Value Date/Time   CHOL 152 02/20/2020 0829   TRIG 66 02/20/2020 0829   HDL 84 02/20/2020 0829   CHOLHDL 1.8 02/20/2020 0829   CHOLHDL 2 06/12/2013 0832   VLDL 6.0 06/12/2013 0832   LDLCALC 55 02/20/2020 0829     Risk Assessment/Calculations:       Physical Exam:    VS:  BP 128/74   Pulse 82   Ht 4\' 10"  (1.473 m)   Wt 108 lb (49 kg)   SpO2 98%   BMI 22.57 kg/m     Wt Readings from Last 3 Encounters:  12/05/20 108 lb (49 kg)  04/14/20 109 lb 3.2 oz (49.5 kg)  09/18/19 110 lb 12.8 oz (50.3 kg)     GEN:  Well nourished, well developed in no acute distress HEENT: Normal NECK: No JVD; No carotid bruits CARDIAC:  RRR, no murmurs, rubs, gallops RESPIRATORY:  Diminished throughout but clear ABDOMEN: Soft, non-tender, non-distended MUSCULOSKELETAL:  No edema; No deformity  SKIN: Warm and dry NEUROLOGIC:  Alert and oriented x 3 PSYCHIATRIC:  Normal affect   ASSESSMENT:    1. Coronary artery calcification seen on CT scan   2. Mixed hyperlipidemia   3. Statin intolerance   4. Tobacco abuse   5. Essential hypertension    PLAN:    In order of problems listed above:  #Coronary Calcification: CT lung cancer screen with LAD calcification. No symptoms currently. LDL well controlled on zetia; currently 67.  -Continue ASA and zetia -Statin intolerance -Continue smoking cessation counseling -Continue imdur   #HTN: Controlled. -Continue coreg and quiniapril -Continue imdur  #HLD: Statin intolerant. LDL well controlled at 67. -Continue zetia  #Tobacco Abuse: -Smoking cessation counseling   Medication Adjustments/Labs and Tests Ordered: Current medicines are reviewed at length with the patient today.  Concerns regarding medicines are outlined above.  No orders of the defined types were placed in this encounter.  Meds ordered this encounter  Medications  . nitroGLYCERIN (NITROSTAT) 0.4 MG SL tablet    Sig: Place 1 tablet (0.4 mg total) under the tongue every 5 (five) minutes as needed for chest pain.    Dispense:  25 tablet    Refill:  3    Patient Instructions  Medication Instructions:  Your physician recommends that you continue on your current medications as directed. Please refer to the Current Medication list given to you today.  *If you need a refill on your cardiac medications before your next appointment, please call your pharmacy*   Lab Work: None ordered  If you have labs (blood work) drawn today and your tests are completely normal, you will receive your results only by: 09/20/19 MyChart Message (if you have MyChart) OR . A paper copy in the mail If you have any lab test that  is abnormal or we need to change your treatment, we will call you to review the results.   Testing/Procedures: None ordered   Follow-Up: At [facility], you and your health needs are our priority.  As part of our continuing mission to provide you with exceptional heart care, we have created designated Provider Care Teams.  These Care Teams include your primary Cardiologist (physician) and Advanced Practice Providers (APPs -  Physician  Assistants and Nurse Practitioners) who all work together to provide you with the care you need, when you need it.  We recommend signing up for the patient portal called "MyChart".  Sign up information is provided on this After Visit Summary.  MyChart is used to connect with patients for Virtual Visits (Telemedicine).  Patients are able to view lab/test results, encounter notes, upcoming appointments, etc.  Non-urgent messages can be sent to your provider as well.   To learn more about what you can do with MyChart, go to [email or web address].    Your next appointment:   6 month(s)  The format for your next appointment:   In Person  Provider:   Laurance FlattenHeather Pemberton, MD   Other Instructions      Signed, Meriam SpragueHeather E Pemberton, MD  12/05/2020 11:17 AM    [facility]

## 2020-12-05 ENCOUNTER — Other Ambulatory Visit: Payer: Self-pay

## 2020-12-05 ENCOUNTER — Encounter: Payer: Self-pay | Admitting: Cardiology

## 2020-12-05 ENCOUNTER — Ambulatory Visit: Payer: Medicare Other | Admitting: Cardiology

## 2020-12-05 VITALS — BP 128/74 | HR 82 | Ht <= 58 in | Wt 108.0 lb

## 2020-12-05 DIAGNOSIS — Z720 Tobacco use: Secondary | ICD-10-CM | POA: Diagnosis not present

## 2020-12-05 DIAGNOSIS — I2510 Atherosclerotic heart disease of native coronary artery without angina pectoris: Secondary | ICD-10-CM | POA: Diagnosis not present

## 2020-12-05 DIAGNOSIS — E782 Mixed hyperlipidemia: Secondary | ICD-10-CM

## 2020-12-05 DIAGNOSIS — T466X5A Adverse effect of antihyperlipidemic and antiarteriosclerotic drugs, initial encounter: Secondary | ICD-10-CM

## 2020-12-05 DIAGNOSIS — I10 Essential (primary) hypertension: Secondary | ICD-10-CM

## 2020-12-05 DIAGNOSIS — Z789 Other specified health status: Secondary | ICD-10-CM | POA: Diagnosis not present

## 2020-12-05 DIAGNOSIS — G720 Drug-induced myopathy: Secondary | ICD-10-CM

## 2020-12-05 MED ORDER — NITROGLYCERIN 0.4 MG SL SUBL: 0.4000 mg | SUBLINGUAL_TABLET | SUBLINGUAL | 3 refills | Status: DC | PRN

## 2020-12-05 NOTE — Patient Instructions (Signed)
Medication Instructions:  Your physician recommends that you continue on your current medications as directed. Please refer to the Current Medication list given to you today.  *If you need a refill on your cardiac medications before your next appointment, please call your pharmacy*   Lab Work: None ordered  If you have labs (blood work) drawn today and your tests are completely normal, you will receive your results only by: . MyChart Message (if you have MyChart) OR . A paper copy in the mail If you have any lab test that is abnormal or we need to change your treatment, we will call you to review the results.   Testing/Procedures: None ordered   Follow-Up: At [facility], you and your health needs are our priority.  As part of our continuing mission to provide you with exceptional heart care, we have created designated Provider Care Teams.  These Care Teams include your primary Cardiologist (physician) and Advanced Practice Providers (APPs -  Physician Assistants and Nurse Practitioners) who all work together to provide you with the care you need, when you need it.  We recommend signing up for the patient portal called "MyChart".  Sign up information is provided on this After Visit Summary.  MyChart is used to connect with patients for Virtual Visits (Telemedicine).  Patients are able to view lab/test results, encounter notes, upcoming appointments, etc.  Non-urgent messages can be sent to your provider as well.   To learn more about what you can do with MyChart, go to [email or web address].    Your next appointment:   6 month(s)  The format for your next appointment:   In Person  Provider:   Heather Pemberton, MD   Other Instructions   

## 2020-12-10 ENCOUNTER — Ambulatory Visit: Payer: Medicare Other

## 2020-12-16 DIAGNOSIS — M8580 Other specified disorders of bone density and structure, unspecified site: Secondary | ICD-10-CM | POA: Diagnosis not present

## 2020-12-16 DIAGNOSIS — E782 Mixed hyperlipidemia: Secondary | ICD-10-CM | POA: Diagnosis not present

## 2020-12-16 DIAGNOSIS — I10 Essential (primary) hypertension: Secondary | ICD-10-CM | POA: Diagnosis not present

## 2020-12-16 DIAGNOSIS — J449 Chronic obstructive pulmonary disease, unspecified: Secondary | ICD-10-CM | POA: Diagnosis not present

## 2020-12-16 DIAGNOSIS — E1169 Type 2 diabetes mellitus with other specified complication: Secondary | ICD-10-CM | POA: Diagnosis not present

## 2021-01-27 DIAGNOSIS — I700 Atherosclerosis of aorta: Secondary | ICD-10-CM | POA: Diagnosis not present

## 2021-01-27 DIAGNOSIS — J449 Chronic obstructive pulmonary disease, unspecified: Secondary | ICD-10-CM | POA: Diagnosis not present

## 2021-01-27 DIAGNOSIS — M419 Scoliosis, unspecified: Secondary | ICD-10-CM | POA: Diagnosis not present

## 2021-01-27 DIAGNOSIS — E1169 Type 2 diabetes mellitus with other specified complication: Secondary | ICD-10-CM | POA: Diagnosis not present

## 2021-01-27 DIAGNOSIS — G720 Drug-induced myopathy: Secondary | ICD-10-CM | POA: Diagnosis not present

## 2021-01-27 DIAGNOSIS — E782 Mixed hyperlipidemia: Secondary | ICD-10-CM | POA: Diagnosis not present

## 2021-01-29 DIAGNOSIS — M79671 Pain in right foot: Secondary | ICD-10-CM | POA: Diagnosis not present

## 2021-01-29 DIAGNOSIS — E1169 Type 2 diabetes mellitus with other specified complication: Secondary | ICD-10-CM | POA: Diagnosis not present

## 2021-01-30 ENCOUNTER — Other Ambulatory Visit: Payer: Self-pay

## 2021-01-30 ENCOUNTER — Ambulatory Visit
Admission: RE | Admit: 2021-01-30 | Discharge: 2021-01-30 | Disposition: A | Discharge status: Home / Self Care | Payer: Medicare Other | Source: Ambulatory Visit | Attending: Family Medicine | Admitting: Family Medicine

## 2021-01-30 DIAGNOSIS — Z1231 Encounter for screening mammogram for malignant neoplasm of breast: Secondary | ICD-10-CM | POA: Diagnosis not present

## 2021-02-17 DIAGNOSIS — H35033 Hypertensive retinopathy, bilateral: Secondary | ICD-10-CM | POA: Diagnosis not present

## 2021-02-17 DIAGNOSIS — H04123 Dry eye syndrome of bilateral lacrimal glands: Secondary | ICD-10-CM | POA: Diagnosis not present

## 2021-02-17 DIAGNOSIS — E119 Type 2 diabetes mellitus without complications: Secondary | ICD-10-CM | POA: Diagnosis not present

## 2021-02-17 DIAGNOSIS — H2513 Age-related nuclear cataract, bilateral: Secondary | ICD-10-CM | POA: Diagnosis not present

## 2021-04-08 DIAGNOSIS — I10 Essential (primary) hypertension: Secondary | ICD-10-CM | POA: Diagnosis not present

## 2021-04-08 DIAGNOSIS — E1169 Type 2 diabetes mellitus with other specified complication: Secondary | ICD-10-CM | POA: Diagnosis not present

## 2021-04-08 DIAGNOSIS — J449 Chronic obstructive pulmonary disease, unspecified: Secondary | ICD-10-CM | POA: Diagnosis not present

## 2021-04-08 DIAGNOSIS — E782 Mixed hyperlipidemia: Secondary | ICD-10-CM | POA: Diagnosis not present

## 2021-04-08 DIAGNOSIS — M8580 Other specified disorders of bone density and structure, unspecified site: Secondary | ICD-10-CM | POA: Diagnosis not present

## 2021-04-13 DIAGNOSIS — G720 Drug-induced myopathy: Secondary | ICD-10-CM | POA: Insufficient documentation

## 2021-05-04 DIAGNOSIS — L84 Corns and callosities: Secondary | ICD-10-CM | POA: Diagnosis not present

## 2021-05-04 DIAGNOSIS — M79671 Pain in right foot: Secondary | ICD-10-CM | POA: Diagnosis not present

## 2021-05-06 ENCOUNTER — Ambulatory Visit (INDEPENDENT_AMBULATORY_CARE_PROVIDER_SITE_OTHER)
Admission: RE | Admit: 2021-05-06 | Discharge: 2021-05-06 | Disposition: A | Discharge status: Home / Self Care | Payer: Medicare Other | Source: Ambulatory Visit | Attending: Family Medicine | Admitting: Family Medicine

## 2021-05-06 ENCOUNTER — Other Ambulatory Visit: Payer: Self-pay

## 2021-05-06 DIAGNOSIS — F17210 Nicotine dependence, cigarettes, uncomplicated: Secondary | ICD-10-CM | POA: Diagnosis not present

## 2021-05-06 DIAGNOSIS — Z87891 Personal history of nicotine dependence: Secondary | ICD-10-CM

## 2021-05-21 NOTE — Progress Notes (Signed)
Please call patient and let them  know their  low dose Ct was read as a Lung RADS 2: nodules that are benign in appearance and behavior with a very low likelihood of becoming a clinically active cancer due to size or lack of growth. Recommendation per radiology is for a repeat LDCT in 12 months. .Please let them  know we will order and schedule their  annual screening scan for 05/2022. Please let them  know there was notation of CAD on their  scan.  Please remind the patient  that this is a non-gated exam therefore degree or severity of disease  cannot be determined. Please have them  follow up with their PCP regarding potential risk factor modification, dietary therapy or pharmacologic therapy if clinically indicated. Pt.  is not  currently on statin therapy. Please place order for annual  screening scan for  05/2022 and fax results to PCP. Thanks so much. She is followed by cardiology.

## 2021-05-25 ENCOUNTER — Encounter: Payer: Self-pay | Admitting: *Deleted

## 2021-05-25 DIAGNOSIS — Z87891 Personal history of nicotine dependence: Secondary | ICD-10-CM

## 2021-05-25 DIAGNOSIS — F17210 Nicotine dependence, cigarettes, uncomplicated: Secondary | ICD-10-CM

## 2021-06-01 DIAGNOSIS — E782 Mixed hyperlipidemia: Secondary | ICD-10-CM | POA: Diagnosis not present

## 2021-06-01 DIAGNOSIS — I10 Essential (primary) hypertension: Secondary | ICD-10-CM | POA: Diagnosis not present

## 2021-06-01 DIAGNOSIS — M8580 Other specified disorders of bone density and structure, unspecified site: Secondary | ICD-10-CM | POA: Diagnosis not present

## 2021-06-01 DIAGNOSIS — J449 Chronic obstructive pulmonary disease, unspecified: Secondary | ICD-10-CM | POA: Diagnosis not present

## 2021-06-01 DIAGNOSIS — E1169 Type 2 diabetes mellitus with other specified complication: Secondary | ICD-10-CM | POA: Diagnosis not present

## 2021-06-25 ENCOUNTER — Other Ambulatory Visit: Payer: Self-pay

## 2021-06-25 MED ORDER — CARVEDILOL 3.125 MG PO TABS: 3.1250 mg | ORAL_TABLET | Freq: Two times a day (BID) | ORAL | 1 refills | Status: DC

## 2021-07-13 ENCOUNTER — Other Ambulatory Visit: Payer: Self-pay | Admitting: *Deleted

## 2021-07-13 MED ORDER — ISOSORBIDE MONONITRATE ER 30 MG PO TB24: 30.0000 mg | ORAL_TABLET | Freq: Every day | ORAL | 3 refills | Status: DC

## 2021-07-13 MED ORDER — EZETIMIBE 10 MG PO TABS: 10.0000 mg | ORAL_TABLET | Freq: Every day | ORAL | 3 refills | Status: DC

## 2021-07-29 DIAGNOSIS — M8580 Other specified disorders of bone density and structure, unspecified site: Secondary | ICD-10-CM | POA: Diagnosis not present

## 2021-07-29 DIAGNOSIS — E782 Mixed hyperlipidemia: Secondary | ICD-10-CM | POA: Diagnosis not present

## 2021-07-29 DIAGNOSIS — E1169 Type 2 diabetes mellitus with other specified complication: Secondary | ICD-10-CM | POA: Diagnosis not present

## 2021-07-29 DIAGNOSIS — I10 Essential (primary) hypertension: Secondary | ICD-10-CM | POA: Diagnosis not present

## 2021-07-29 DIAGNOSIS — J449 Chronic obstructive pulmonary disease, unspecified: Secondary | ICD-10-CM | POA: Diagnosis not present

## 2021-07-30 DIAGNOSIS — I10 Essential (primary) hypertension: Secondary | ICD-10-CM | POA: Diagnosis not present

## 2021-07-30 DIAGNOSIS — E782 Mixed hyperlipidemia: Secondary | ICD-10-CM | POA: Diagnosis not present

## 2021-07-30 DIAGNOSIS — E1169 Type 2 diabetes mellitus with other specified complication: Secondary | ICD-10-CM | POA: Diagnosis not present

## 2021-07-30 DIAGNOSIS — E559 Vitamin D deficiency, unspecified: Secondary | ICD-10-CM | POA: Diagnosis not present

## 2021-08-06 DIAGNOSIS — I700 Atherosclerosis of aorta: Secondary | ICD-10-CM | POA: Diagnosis not present

## 2021-08-06 DIAGNOSIS — J449 Chronic obstructive pulmonary disease, unspecified: Secondary | ICD-10-CM | POA: Diagnosis not present

## 2021-08-06 DIAGNOSIS — E1169 Type 2 diabetes mellitus with other specified complication: Secondary | ICD-10-CM | POA: Diagnosis not present

## 2021-08-06 DIAGNOSIS — I10 Essential (primary) hypertension: Secondary | ICD-10-CM | POA: Diagnosis not present

## 2021-08-06 DIAGNOSIS — Z0000 Encounter for general adult medical examination without abnormal findings: Secondary | ICD-10-CM | POA: Diagnosis not present

## 2021-08-06 DIAGNOSIS — E782 Mixed hyperlipidemia: Secondary | ICD-10-CM | POA: Diagnosis not present

## 2021-08-06 DIAGNOSIS — G720 Drug-induced myopathy: Secondary | ICD-10-CM | POA: Diagnosis not present

## 2021-08-14 DIAGNOSIS — H8112 Benign paroxysmal vertigo, left ear: Secondary | ICD-10-CM | POA: Diagnosis not present

## 2021-10-06 DIAGNOSIS — J209 Acute bronchitis, unspecified: Secondary | ICD-10-CM | POA: Diagnosis not present

## 2021-10-09 DIAGNOSIS — J449 Chronic obstructive pulmonary disease, unspecified: Secondary | ICD-10-CM | POA: Diagnosis not present

## 2021-10-09 DIAGNOSIS — E1169 Type 2 diabetes mellitus with other specified complication: Secondary | ICD-10-CM | POA: Diagnosis not present

## 2021-10-09 DIAGNOSIS — E782 Mixed hyperlipidemia: Secondary | ICD-10-CM | POA: Diagnosis not present

## 2021-10-09 DIAGNOSIS — I10 Essential (primary) hypertension: Secondary | ICD-10-CM | POA: Diagnosis not present

## 2021-10-09 DIAGNOSIS — M8580 Other specified disorders of bone density and structure, unspecified site: Secondary | ICD-10-CM | POA: Diagnosis not present

## 2021-11-20 DIAGNOSIS — M79641 Pain in right hand: Secondary | ICD-10-CM | POA: Diagnosis not present

## 2021-11-20 DIAGNOSIS — M66242 Spontaneous rupture of extensor tendons, left hand: Secondary | ICD-10-CM | POA: Diagnosis not present

## 2021-11-20 DIAGNOSIS — M79642 Pain in left hand: Secondary | ICD-10-CM | POA: Diagnosis not present

## 2021-11-20 DIAGNOSIS — M6789 Other specified disorders of synovium and tendon, multiple sites: Secondary | ICD-10-CM | POA: Diagnosis not present

## 2021-12-08 DIAGNOSIS — M6789 Other specified disorders of synovium and tendon, multiple sites: Secondary | ICD-10-CM | POA: Diagnosis not present

## 2021-12-08 DIAGNOSIS — M79642 Pain in left hand: Secondary | ICD-10-CM | POA: Diagnosis not present

## 2021-12-08 DIAGNOSIS — M79641 Pain in right hand: Secondary | ICD-10-CM | POA: Diagnosis not present

## 2021-12-08 DIAGNOSIS — M1812 Unilateral primary osteoarthritis of first carpometacarpal joint, left hand: Secondary | ICD-10-CM | POA: Diagnosis not present

## 2021-12-08 DIAGNOSIS — M66242 Spontaneous rupture of extensor tendons, left hand: Secondary | ICD-10-CM | POA: Diagnosis not present

## 2021-12-17 DIAGNOSIS — S63211A Subluxation of metacarpophalangeal joint of left index finger, initial encounter: Secondary | ICD-10-CM | POA: Diagnosis not present

## 2021-12-17 DIAGNOSIS — M79641 Pain in right hand: Secondary | ICD-10-CM | POA: Diagnosis not present

## 2021-12-17 DIAGNOSIS — S63213A Subluxation of metacarpophalangeal joint of left middle finger, initial encounter: Secondary | ICD-10-CM | POA: Diagnosis not present

## 2021-12-17 DIAGNOSIS — M6789 Other specified disorders of synovium and tendon, multiple sites: Secondary | ICD-10-CM | POA: Diagnosis not present

## 2021-12-17 DIAGNOSIS — S63659A Sprain of metacarpophalangeal joint of unspecified finger, initial encounter: Secondary | ICD-10-CM | POA: Diagnosis not present

## 2021-12-17 DIAGNOSIS — M66242 Spontaneous rupture of extensor tendons, left hand: Secondary | ICD-10-CM | POA: Diagnosis not present

## 2021-12-17 DIAGNOSIS — M79642 Pain in left hand: Secondary | ICD-10-CM | POA: Diagnosis not present

## 2021-12-17 DIAGNOSIS — S66812A Strain of other specified muscles, fascia and tendons at wrist and hand level, left hand, initial encounter: Secondary | ICD-10-CM | POA: Diagnosis not present

## 2021-12-21 DIAGNOSIS — M25572 Pain in left ankle and joints of left foot: Secondary | ICD-10-CM | POA: Diagnosis not present

## 2021-12-28 ENCOUNTER — Other Ambulatory Visit: Payer: Self-pay | Admitting: *Deleted

## 2021-12-28 MED ORDER — CARVEDILOL 3.125 MG PO TABS: 3.1250 mg | ORAL_TABLET | Freq: Two times a day (BID) | ORAL | 0 refills | Status: DC

## 2022-01-01 ENCOUNTER — Other Ambulatory Visit: Payer: Self-pay | Admitting: Family Medicine

## 2022-01-01 DIAGNOSIS — Z1231 Encounter for screening mammogram for malignant neoplasm of breast: Secondary | ICD-10-CM

## 2022-01-06 DIAGNOSIS — I7300 Raynaud's syndrome without gangrene: Secondary | ICD-10-CM | POA: Diagnosis not present

## 2022-01-06 DIAGNOSIS — M79642 Pain in left hand: Secondary | ICD-10-CM | POA: Diagnosis not present

## 2022-01-06 DIAGNOSIS — M1991 Primary osteoarthritis, unspecified site: Secondary | ICD-10-CM | POA: Diagnosis not present

## 2022-01-06 DIAGNOSIS — M669 Spontaneous rupture of unspecified tendon: Secondary | ICD-10-CM | POA: Diagnosis not present

## 2022-01-06 DIAGNOSIS — M25579 Pain in unspecified ankle and joints of unspecified foot: Secondary | ICD-10-CM | POA: Diagnosis not present

## 2022-01-06 DIAGNOSIS — Z6821 Body mass index (BMI) 21.0-21.9, adult: Secondary | ICD-10-CM | POA: Diagnosis not present

## 2022-01-06 DIAGNOSIS — R5383 Other fatigue: Secondary | ICD-10-CM | POA: Diagnosis not present

## 2022-01-18 ENCOUNTER — Other Ambulatory Visit: Payer: Self-pay | Admitting: *Deleted

## 2022-01-18 MED ORDER — CARVEDILOL 3.125 MG PO TABS: 3.1250 mg | ORAL_TABLET | Freq: Two times a day (BID) | ORAL | 0 refills | Status: DC

## 2022-01-28 DIAGNOSIS — M25579 Pain in unspecified ankle and joints of unspecified foot: Secondary | ICD-10-CM | POA: Diagnosis not present

## 2022-01-28 DIAGNOSIS — M1991 Primary osteoarthritis, unspecified site: Secondary | ICD-10-CM | POA: Diagnosis not present

## 2022-01-28 DIAGNOSIS — M79642 Pain in left hand: Secondary | ICD-10-CM | POA: Diagnosis not present

## 2022-01-28 DIAGNOSIS — Z6822 Body mass index (BMI) 22.0-22.9, adult: Secondary | ICD-10-CM | POA: Diagnosis not present

## 2022-01-28 DIAGNOSIS — I7300 Raynaud's syndrome without gangrene: Secondary | ICD-10-CM | POA: Diagnosis not present

## 2022-02-01 ENCOUNTER — Ambulatory Visit
Admission: RE | Admit: 2022-02-01 | Discharge: 2022-02-01 | Disposition: A | Discharge status: Home / Self Care | Payer: Medicare Other | Source: Ambulatory Visit | Attending: Family Medicine | Admitting: Family Medicine

## 2022-02-01 DIAGNOSIS — Z1231 Encounter for screening mammogram for malignant neoplasm of breast: Secondary | ICD-10-CM

## 2022-02-03 DIAGNOSIS — J449 Chronic obstructive pulmonary disease, unspecified: Secondary | ICD-10-CM | POA: Diagnosis not present

## 2022-02-03 DIAGNOSIS — I10 Essential (primary) hypertension: Secondary | ICD-10-CM | POA: Diagnosis not present

## 2022-02-03 DIAGNOSIS — E1169 Type 2 diabetes mellitus with other specified complication: Secondary | ICD-10-CM | POA: Diagnosis not present

## 2022-02-10 ENCOUNTER — Other Ambulatory Visit: Payer: Self-pay | Admitting: Rheumatology

## 2022-02-10 DIAGNOSIS — M25579 Pain in unspecified ankle and joints of unspecified foot: Secondary | ICD-10-CM

## 2022-02-11 ENCOUNTER — Other Ambulatory Visit: Payer: Self-pay

## 2022-02-11 DIAGNOSIS — S63659D Sprain of metacarpophalangeal joint of unspecified finger, subsequent encounter: Secondary | ICD-10-CM | POA: Diagnosis not present

## 2022-02-11 DIAGNOSIS — S66812D Strain of other specified muscles, fascia and tendons at wrist and hand level, left hand, subsequent encounter: Secondary | ICD-10-CM | POA: Diagnosis not present

## 2022-02-11 DIAGNOSIS — S63213D Subluxation of metacarpophalangeal joint of left middle finger, subsequent encounter: Secondary | ICD-10-CM | POA: Diagnosis not present

## 2022-02-11 DIAGNOSIS — S63211D Subluxation of metacarpophalangeal joint of left index finger, subsequent encounter: Secondary | ICD-10-CM | POA: Diagnosis not present

## 2022-02-11 MED ORDER — CARVEDILOL 3.125 MG PO TABS: 3.1250 mg | ORAL_TABLET | Freq: Two times a day (BID) | ORAL | 0 refills | Status: DC

## 2022-02-18 ENCOUNTER — Other Ambulatory Visit: Payer: Self-pay

## 2022-02-19 DIAGNOSIS — H04123 Dry eye syndrome of bilateral lacrimal glands: Secondary | ICD-10-CM | POA: Diagnosis not present

## 2022-02-19 DIAGNOSIS — H35033 Hypertensive retinopathy, bilateral: Secondary | ICD-10-CM | POA: Diagnosis not present

## 2022-02-19 DIAGNOSIS — H2513 Age-related nuclear cataract, bilateral: Secondary | ICD-10-CM | POA: Diagnosis not present

## 2022-02-19 DIAGNOSIS — E119 Type 2 diabetes mellitus without complications: Secondary | ICD-10-CM | POA: Diagnosis not present

## 2022-02-21 ENCOUNTER — Ambulatory Visit
Admission: RE | Admit: 2022-02-21 | Discharge: 2022-02-21 | Disposition: A | Discharge status: Home / Self Care | Payer: Medicare Other | Source: Ambulatory Visit | Attending: Rheumatology | Admitting: Rheumatology

## 2022-02-21 DIAGNOSIS — M25471 Effusion, right ankle: Secondary | ICD-10-CM | POA: Diagnosis not present

## 2022-02-21 DIAGNOSIS — M65871 Other synovitis and tenosynovitis, right ankle and foot: Secondary | ICD-10-CM | POA: Diagnosis not present

## 2022-02-21 DIAGNOSIS — M25579 Pain in unspecified ankle and joints of unspecified foot: Secondary | ICD-10-CM

## 2022-02-21 DIAGNOSIS — R600 Localized edema: Secondary | ICD-10-CM | POA: Diagnosis not present

## 2022-02-22 ENCOUNTER — Other Ambulatory Visit: Payer: Self-pay

## 2022-02-22 MED ORDER — CARVEDILOL 3.125 MG PO TABS: 3.1250 mg | ORAL_TABLET | Freq: Two times a day (BID) | ORAL | 0 refills | Status: DC

## 2022-03-08 ENCOUNTER — Other Ambulatory Visit: Payer: Self-pay

## 2022-03-11 ENCOUNTER — Other Ambulatory Visit: Payer: Self-pay

## 2022-03-11 MED ORDER — CARVEDILOL 3.125 MG PO TABS: 3.1250 mg | ORAL_TABLET | Freq: Two times a day (BID) | ORAL | 0 refills | Status: DC

## 2022-03-31 DIAGNOSIS — Z6821 Body mass index (BMI) 21.0-21.9, adult: Secondary | ICD-10-CM | POA: Diagnosis not present

## 2022-03-31 DIAGNOSIS — I7300 Raynaud's syndrome without gangrene: Secondary | ICD-10-CM | POA: Diagnosis not present

## 2022-03-31 DIAGNOSIS — M1991 Primary osteoarthritis, unspecified site: Secondary | ICD-10-CM | POA: Diagnosis not present

## 2022-03-31 DIAGNOSIS — M25579 Pain in unspecified ankle and joints of unspecified foot: Secondary | ICD-10-CM | POA: Diagnosis not present

## 2022-03-31 DIAGNOSIS — Z79899 Other long term (current) drug therapy: Secondary | ICD-10-CM | POA: Diagnosis not present

## 2022-03-31 DIAGNOSIS — M79642 Pain in left hand: Secondary | ICD-10-CM | POA: Diagnosis not present

## 2022-04-05 ENCOUNTER — Other Ambulatory Visit: Payer: Self-pay

## 2022-04-05 MED ORDER — CARVEDILOL 3.125 MG PO TABS: 3.1250 mg | ORAL_TABLET | Freq: Two times a day (BID) | ORAL | 0 refills | Status: DC

## 2022-04-17 NOTE — Progress Notes (Unsigned)
Cardiology Office Note:    Date:  04/19/2022   ID:  Melissa Waters, DOB Apr 20, 1951, MRN [id number]  PCP:  Daisy Floro, MD   [facility]  Cardiologist:  Tobias Alexander, MD  Advanced Practice Provider:  No care team member to display Electrophysiologist:  None    Referring MD: Daisy Floro, MD    History of Present Illness:    Melissa Waters is a 71 y.o. female with a hx of HTN, family history of CAD, tobacco abuse, coronary calcification and tobacco abuse who was previously seen by Dr. Delton See who returns to clinic for follow-up.   Saw Dr. Delton See in 04/2020. She had been following in lipid clinic for statin intolerance currently on zetia. She was active and asymptomatic at that time. Lexiscan 2016 negative for ischemia. Chest CT for lung cancer screening 01/2018 showed coronary atherosclerosis in the LAD.  Was last seen in clinic on 12/2020 where she was doing well from a CV standpoint.   Today, the patient overall feels well. States she has been needing use nitro more often than previously due to chest pressure radiating to her back. Symptoms resolve with one pill within a couple of minutes and she can resume her activity. No significant SOB. Ambulation is limited due to foot pain. No orthopnea, PND, or LE edema. Continues to smoke about 4-6 cigarettes per day.  Past Medical History:  Diagnosis Date   COPD (chronic obstructive pulmonary disease) (HCC)    Diabetes mellitus without complication (HCC)    no meds    Diverticulosis    Hx: of   Heart murmur    as a baby   Hypertension    Nephritis    Hx: of 1958   Osteoarthritis    right hip   Osteoarthritis of left hip 08/13/2013    Past Surgical History:  Procedure Laterality Date   COLONOSCOPY W/ POLYPECTOMY  2006   TOTAL HIP ARTHROPLASTY Right    05/27/2009   TOTAL HIP ARTHROPLASTY Left 08/13/2013   Procedure: TOTAL HIP ARTHROPLASTY;  Surgeon: Eulas Post, MD;  Location: [facility] OR;   Service: Orthopedics;  Laterality: Left;    Current Medications: Current Meds  Medication Sig   aspirin EC 81 MG tablet Take 1 tablet (81 mg total) by mouth daily.   BIOTIN PO Take 1 tablet by mouth daily.   Carboxymethylcellul-Glycerin (CLEAR EYES FOR DRY EYES OP) Place 1 drop into both eyes 2 (two) times daily. Clear Eyes for Dry Eyes   carvedilol (COREG) 3.125 MG tablet Take 1 tablet (3.125 mg total) by mouth 2 (two) times daily with a meal.   Cholecalciferol (VITAMIN D3) 5000 units CAPS Take 1 capsule by mouth daily.   ezetimibe (ZETIA) 10 MG tablet Take 1 tablet (10 mg total) by mouth daily.   isosorbide mononitrate (IMDUR) 30 MG 24 hr tablet Take 1 tablet (30 mg total) by mouth daily.   meloxicam (MOBIC) 15 MG tablet Take 15 mg by mouth daily.   metoprolol tartrate (LOPRESSOR) 100 MG tablet Take 1 tablet (100 mg total) by mouth once for 1 dose. Take 90-120 minutes prior to scan.   MILK THISTLE PO Take 350 mg by mouth daily.   nitroGLYCERIN (NITROSTAT) 0.4 MG SL tablet Place 1 tablet (0.4 mg total) under the tongue every 5 (five) minutes as needed for chest pain.   Probiotic Product (PHILLIPS COLON HEALTH PO) Take 1 capsule by mouth daily.   valsartan (DIOVAN) 160 MG tablet  Take 160 mg by mouth daily.   vitamin C (ASCORBIC ACID) 500 MG tablet Take 500 mg by mouth daily.     Allergies:   Amoxil [amoxicillin], Metformin, Pravastatin, and Rosuvastatin   Social History   Socioeconomic History   Marital status: Married    Spouse name: Not on file   Number of children: Not on file   Years of education: Not on file   Highest education level: Not on file  Occupational History   Not on file  Tobacco Use   Smoking status: Every Day    Packs/day: 1.25    Years: 49.00    Total pack years: 01/29/60    Types: Cigarettes   Smokeless tobacco: Never   Tobacco comments:    5 per day. Smoked 1/2 to 2 ppd x 49 years  Substance and Sexual Activity   Alcohol use: Yes    Alcohol/week: 0.0  standard drinks of alcohol    Comment: social   Drug use: No   Sexual activity: Not on file  Other Topics Concern   Not on file  Social History Narrative   Not on file   Social Determinants of Health   Financial Resource Strain: Not on file  Food Insecurity: Not on file  Transportation Needs: Not on file  Physical Activity: Not on file  Stress: Not on file  Social Connections: Not on file     Family History: The patient's family history includes Breast cancer in her mother; Heart Problems (age of onset: 5) in her father; Heart attack (age of onset: 63) in an other family member; Hypertension in her mother.  ROS:   Please see the history of present illness.    Review of Systems  Constitutional:  Negative for chills, diaphoresis and fever.  HENT:  Negative for hearing loss.   Eyes:  Negative for blurred vision and redness.  Respiratory:  Positive for shortness of breath.   Cardiovascular:  Positive for chest pain. Negative for palpitations, orthopnea, claudication, leg swelling and PND.  Gastrointestinal:  Negative for heartburn and melena.  Genitourinary:  Negative for dysuria and flank pain.  Musculoskeletal:  Positive for joint pain and myalgias.  Neurological:  Negative for dizziness and loss of consciousness.  Endo/Heme/Allergies:  Negative for polydipsia.  Psychiatric/Behavioral:  The patient is not nervous/anxious.     EKGs/Labs/Other Studies Reviewed:    The following studies were reviewed today: TTE 01-28-2014: Study Conclusions   - Left ventricle: The cavity size was normal. Systolic    function was normal. The estimated ejection fraction was    in the range of 60% to 65%. Wall motion was normal; there    were no regional wall motion abnormalities. Doppler    parameters are consistent with abnormal left ventricular    relaxation (grade 1 diastolic dysfunction). There was no    evidence of elevated ventricular filling pressure by    Doppler parameters.  - Aortic  valve: Trileaflet; normal thickness leaflets. No    regurgitation.  - Aortic root: The aortic root was normal in size.  - Mitral valve: Structurally normal valve. No regurgitation.  - Left atrium: The atrium was normal in size.  - Right ventricle: Systolic function was normal.  - Tricuspid valve: Mild regurgitation.  - Pulmonic valve: No regurgitation.  - Pulmonary arteries: Systolic pressure was within the    normal range.   CT Lung Cancer Screen 05/2020: FINDINGS: Cardiovascular: Heart is normal in size.  No pericardial effusion.   Ectasia of  the ascending thoracic aorta, measuring 3.6 cm (coronal image 32). Atherosclerotic calcifications of the aortic arch.   Coronary atherosclerosis of the LAD.   Mediastinum/Nodes: No suspicious mediastinal lymphadenopathy.   Visualized thyroid is unremarkable.   Lungs/Pleura: Mild biapical pleural-parenchymal scarring.   Mild subpleural reticulation/fibrosis in the bilateral lower lobes.   No focal consolidation.   3.5 mm triangular subpleural nodule inferiorly in the right upper lobe, benign.   No pleural effusion or pneumothorax.   Upper Abdomen: Visualized upper abdomen is grossly unremarkable.   Musculoskeletal: Degenerative changes of the visualized thoracolumbar spine.   IMPRESSION: Lung-RADS 2, benign appearance or behavior. Continue annual screening with low-dose chest CT without contrast in 12 months.   Aortic Atherosclerosis (ICD10-I70.0).   Recent Labs: No results found for requested labs within last 365 days.  Recent Lipid Panel    Component Value Date/Time   CHOL 152 02/20/2020 0829   TRIG 66 02/20/2020 0829   HDL 84 02/20/2020 0829   CHOLHDL 1.8 02/20/2020 0829   CHOLHDL 2 06/12/2013 0832   VLDL 6.0 06/12/2013 0832   LDLCALC 55 02/20/2020 0829     Risk Assessment/Calculations:       Physical Exam:    VS:  BP 110/70 (BP Location: Left Arm, Patient Position: Sitting, Cuff Size: Normal)   Pulse 74    Ht 4\' 10"  (1.473 m)   Wt 107 lb (48.5 kg)   SpO2 95%   BMI 22.36 kg/m     Wt Readings from Last 3 Encounters:  04/19/22 107 lb (48.5 kg)  12/05/20 108 lb (49 kg)  04/14/20 109 lb 3.2 oz (49.5 kg)     GEN:  Well nourished, well developed in no acute distress HEENT: Normal NECK: No JVD; No carotid bruits CARDIAC: RRR, no murmurs RESPIRATORY:  Diminished throughout but clear ABDOMEN: Soft, non-tender, non-distended MUSCULOSKELETAL:  No edema; No deformity  SKIN: Warm and dry NEUROLOGIC:  Alert and oriented x 3 PSYCHIATRIC:  Normal affect   ASSESSMENT:    1. Coronary artery disease of native artery of native heart with stable angina pectoris (HCC)   2. Precordial pain   3. Coronary artery calcification seen on CT scan   4. Mixed hyperlipidemia   5. Statin intolerance   6. Essential hypertension   7. Tobacco abuse     PLAN:    In order of problems listed above:  #Stable Angina: #Coronary Artery Disease with Ca on CT Chest: Patient reports intermittent chest discomfort that resolves with SL NTG. States the episodes are not frequent but are occurring more regularly than last year. CT lung cancer screen with significant LAD and RCA calcification. Given symptoms and known CAD, will check coronary CTA for further evaluation. -Check coronary CTA -Continue ASA and zetia -Unable to tolerate statins; can refer to lipid clinic pending CTA -Continue smoking cessation counseling -Continue imdur  30mg  daily -Continue nitro prn  #HTN: Controlled and at goal <130/90. -Continue coreg 3.125mg  BID and valsartan 160mg  daily -Continue imdur 30mg  daily  #HLD: Statin intolerant. LDL well controlled at 67. -Continue zetia 10mg  daily -Can refer to lipid clinic pending coronary CTA findings  #Tobacco Abuse: -Smoking cessation counseling -Working on cutting back   Medication Adjustments/Labs and Tests Ordered: Current medicines are reviewed at length with the patient today.   Concerns regarding medicines are outlined above.  Orders Placed This Encounter  Procedures   CT CORONARY MORPH W/CTA COR W/SCORE W/CA W/CM &/OR WO/CM   Basic metabolic panel   EKG 12-Lead  Meds ordered this encounter  Medications   metoprolol tartrate (LOPRESSOR) 100 MG tablet    Sig: Take 1 tablet (100 mg total) by mouth once for 1 dose. Take 90-120 minutes prior to scan.    Dispense:  1 tablet    Refill:  0    Patient Instructions  Medication Instructions:   Your physician recommends that you continue on your current medications as directed. Please refer to the Current Medication list given to you today.  *If you need a refill on your cardiac medications before your next appointment, please call your pharmacy*   Testing/Procedures:    Your cardiac CT will be scheduled at one of the below locations:   [facility] [address] [place], [place] [address] [phone]   If scheduled at [facility], please arrive at the [facility] and Children's Entrance (Entrance C2) of [facility] 30 minutes prior to test start time. You can use the FREE valet parking offered at entrance C (encouraged to control the heart rate for the test)  Proceed to the [facility] Radiology Department (first floor) to check-in and test prep.  All radiology patients and guests should use entrance C2 at [facility], accessed from [facility], even though the hospital's physical address listed is [address]     Please follow these instructions carefully (unless otherwise directed):   On the Night Before the Test: Be sure to Drink plenty of water. Do not consume any caffeinated/decaffeinated beverages or chocolate 12 hours prior to your test. Do not take any antihistamines 12 hours prior to your test.   On the Day of the Test: Drink plenty of water until 1 hour prior to the test. Do not eat any food 4 hours prior to the test. You may  take your regular medications prior to the test.  Take metoprolol 100 MG BY MOUTH (Lopressor) two hours prior to test. HOLD CARVEDILOL THE MORNING OF THIS PROCEDURE FEMALES- please wear underwire-free bra if available, avoid dresses & tight clothing   After the Test: Drink plenty of water. After receiving IV contrast, you may experience a mild flushed feeling. This is normal. On occasion, you may experience a mild rash up to 24 hours after the test. This is not dangerous. If this occurs, you can take Benadryl 25 mg and increase your fluid intake. If you experience trouble breathing, this can be serious. If it is severe call 911 IMMEDIATELY. If it is mild, please call our office. If you take any of these medications: Glipizide/Metformin, Avandament, Glucavance, please do not take 48 hours after completing test unless otherwise instructed.  We will call to schedule your test 2-4 weeks out understanding that some insurance companies will need an authorization prior to the service being performed.   For non-scheduling related questions, please contact the cardiac imaging nurse navigator should you have any questions/concerns: Rockwell Alexandria, Cardiac Imaging Nurse Navigator Larey Brick, Cardiac Imaging Nurse Navigator [facility] Direct Office Dial: [phone]   For scheduling needs, including cancellations and rescheduling, please call [place], [phone].   Follow-Up: At [facility], you and your health needs are our priority.  As part of our continuing mission to provide you with exceptional heart care, we have created designated Provider Care Teams.  These Care Teams include your primary Cardiologist (physician) and Advanced Practice Providers (APPs -  Physician Assistants and Nurse Practitioners) who all work together to provide you with the care you  need, when you need it.  We recommend signing up for the patient portal called "MyChart".  Sign up  information is provided on this After Visit Summary.  MyChart is used to connect with patients for Virtual Visits (Telemedicine).  Patients are able to view lab/test results, encounter notes, upcoming appointments, etc.  Non-urgent messages can be sent to your provider as well.   To learn more about what you can do with MyChart, go to [email or web address].    Your next appointment:   6 month(s)  The format for your next appointment:   In Person  Provider:   Chelsea AusVin Bhagat, PA-C, Jari Favreessa Conte, PA-C, Ronie Spiesayna Dunn, PA-C, Robin SearingErnest Dick, NP, Nada BoozerLaura Ingold, NP, Jacolyn ReedyMichele Lenze, PA-C, Eligha BridegroomMichelle Swinyer, NP, or Tereso NewcomerScott Weaver, PA-C     {  Important Information About Sugar          Signed, Meriam SpragueHeather E Jaeden Messer, MD  04/19/2022 5:15 PM    [facility]

## 2022-04-19 ENCOUNTER — Encounter: Payer: Self-pay | Admitting: Cardiology

## 2022-04-19 ENCOUNTER — Ambulatory Visit: Payer: Medicare Other | Admitting: Cardiology

## 2022-04-19 VITALS — BP 110/70 | HR 74 | Ht <= 58 in | Wt 107.0 lb

## 2022-04-19 DIAGNOSIS — I10 Essential (primary) hypertension: Secondary | ICD-10-CM

## 2022-04-19 DIAGNOSIS — R072 Precordial pain: Secondary | ICD-10-CM

## 2022-04-19 DIAGNOSIS — Z789 Other specified health status: Secondary | ICD-10-CM | POA: Diagnosis not present

## 2022-04-19 DIAGNOSIS — I25118 Atherosclerotic heart disease of native coronary artery with other forms of angina pectoris: Secondary | ICD-10-CM

## 2022-04-19 DIAGNOSIS — I2510 Atherosclerotic heart disease of native coronary artery without angina pectoris: Secondary | ICD-10-CM | POA: Diagnosis not present

## 2022-04-19 DIAGNOSIS — Z720 Tobacco use: Secondary | ICD-10-CM

## 2022-04-19 DIAGNOSIS — E782 Mixed hyperlipidemia: Secondary | ICD-10-CM | POA: Diagnosis not present

## 2022-04-19 MED ORDER — METOPROLOL TARTRATE 100 MG PO TABS: 100.0000 mg | ORAL_TABLET | Freq: Once | ORAL | 0 refills | Status: DC

## 2022-04-19 NOTE — Patient Instructions (Signed)
Medication Instructions:   Your physician recommends that you continue on your current medications as directed. Please refer to the Current Medication list given to you today.  *If you need a refill on your cardiac medications before your next appointment, please call your pharmacy*   Testing/Procedures:    Your cardiac CT will be scheduled at one of the below locations:   [facility] [address] [place], [place] [address] [phone]   If scheduled at [facility], please arrive at the [facility] and Children's Entrance (Entrance C2) of [facility] 30 minutes prior to test start time. You can use the FREE valet parking offered at entrance C (encouraged to control the heart rate for the test)  Proceed to the [facility] Radiology Department (first floor) to check-in and test prep.  All radiology patients and guests should use entrance C2 at [facility], accessed from [facility] [place] [facility], even though the hospital's physical address listed is [address].     Please follow these instructions carefully (unless otherwise directed):   On the Night Before the Test: Be sure to Drink plenty of water. Do not consume any caffeinated/decaffeinated beverages or chocolate 12 hours prior to your test. Do not take any antihistamines 12 hours prior to your test.   On the Day of the Test: Drink plenty of water until 1 hour prior to the test. Do not eat any food 4 hours prior to the test. You may take your regular medications prior to the test.  Take metoprolol 100 MG BY MOUTH (Lopressor) two hours prior to test. HOLD CARVEDILOL THE MORNING OF THIS PROCEDURE FEMALES- please wear underwire-free bra if available, avoid dresses & tight clothing   After the Test: Drink plenty of water. After receiving IV contrast, you may experience a mild flushed feeling. This is normal. On occasion, you may experience a mild rash up to 24 hours after  the test. This is not dangerous. If this occurs, you can take Benadryl 25 mg and increase your fluid intake. If you experience trouble breathing, this can be serious. If it is severe call 911 IMMEDIATELY. If it is mild, please call our office. If you take any of these medications: Glipizide/Metformin, Avandament, Glucavance, please do not take 48 hours after completing test unless otherwise instructed.  We will call to schedule your test 2-4 weeks out understanding that some insurance companies will need an authorization prior to the service being performed.   For non-scheduling related questions, please contact the cardiac imaging nurse navigator should you have any questions/concerns: Rockwell Alexandria, Cardiac Imaging Nurse Navigator Larey Brick, Cardiac Imaging Nurse Navigator [facility] Direct Office Dial: [phone]   For scheduling needs, including cancellations and rescheduling, please call [place], [phone].   Follow-Up: At [facility], you and your health needs are our priority.  As part of our continuing mission to provide you with exceptional heart care, we have created designated Provider Care Teams.  These Care Teams include your primary Cardiologist (physician) and Advanced Practice Providers (APPs -  Physician Assistants and Nurse Practitioners) who all work together to provide you with the care you need, when you need it.  We recommend signing up for the patient portal called "MyChart".  Sign up information is provided on this After Visit Summary.  MyChart is used to connect with patients for Virtual Visits (Telemedicine).  Patients are able to view lab/test results, encounter notes, upcoming appointments, etc.  Non-urgent messages  can be sent to your provider as well.   To learn more about what you can do with MyChart, go to [email or web address].    Your next appointment:   6 month(s)  The format for your next appointment:   In  Person  Provider:   Chelsea Aus, PA-C, Jari Favre, PA-C, Ronie Spies, PA-C, Robin Searing, NP, Nada Boozer, NP, Jacolyn Reedy, PA-C, Eligha Bridegroom, NP, or Tereso Newcomer, PA-C     {  Important Information About Sugar

## 2022-04-22 ENCOUNTER — Telehealth: Payer: Self-pay | Admitting: *Deleted

## 2022-04-22 NOTE — Telephone Encounter (Signed)
-----   Message from Lorrin Jackson sent at 04/22/2022  1:02 PM EDT ----- Regarding: ct heart Scheduled 05/11/22 at 11:00

## 2022-04-29 ENCOUNTER — Other Ambulatory Visit: Payer: Self-pay

## 2022-04-29 MED ORDER — CARVEDILOL 3.125 MG PO TABS: 3.1250 mg | ORAL_TABLET | Freq: Two times a day (BID) | ORAL | 3 refills | Status: DC

## 2022-05-06 ENCOUNTER — Ambulatory Visit
Admission: RE | Admit: 2022-05-06 | Discharge: 2022-05-06 | Disposition: A | Discharge status: Home / Self Care | Payer: Medicare Other | Source: Ambulatory Visit | Attending: Acute Care | Admitting: Acute Care

## 2022-05-06 DIAGNOSIS — I700 Atherosclerosis of aorta: Secondary | ICD-10-CM | POA: Diagnosis not present

## 2022-05-06 DIAGNOSIS — F17210 Nicotine dependence, cigarettes, uncomplicated: Secondary | ICD-10-CM

## 2022-05-06 DIAGNOSIS — I2510 Atherosclerotic heart disease of native coronary artery without angina pectoris: Secondary | ICD-10-CM | POA: Diagnosis not present

## 2022-05-06 DIAGNOSIS — Z87891 Personal history of nicotine dependence: Secondary | ICD-10-CM

## 2022-05-06 DIAGNOSIS — J432 Centrilobular emphysema: Secondary | ICD-10-CM | POA: Diagnosis not present

## 2022-05-07 ENCOUNTER — Other Ambulatory Visit: Payer: Self-pay | Admitting: Acute Care

## 2022-05-07 DIAGNOSIS — Z122 Encounter for screening for malignant neoplasm of respiratory organs: Secondary | ICD-10-CM

## 2022-05-07 DIAGNOSIS — F17210 Nicotine dependence, cigarettes, uncomplicated: Secondary | ICD-10-CM

## 2022-05-07 DIAGNOSIS — Z87891 Personal history of nicotine dependence: Secondary | ICD-10-CM

## 2022-05-10 ENCOUNTER — Telehealth (HOSPITAL_COMMUNITY): Payer: Self-pay | Admitting: Emergency Medicine

## 2022-05-10 NOTE — Telephone Encounter (Signed)
Unable to leave vm message. Amberlynn Tempesta RN Navigator Cardiac Imaging [facility] [phone] Office  [phone] Cell  

## 2022-05-11 ENCOUNTER — Other Ambulatory Visit: Payer: Self-pay | Admitting: Cardiology

## 2022-05-11 ENCOUNTER — Ambulatory Visit (HOSPITAL_BASED_OUTPATIENT_CLINIC_OR_DEPARTMENT_OTHER)
Admission: RE | Admit: 2022-05-11 | Discharge: 2022-05-11 | Disposition: A | Discharge status: Home / Self Care | Payer: Medicare Other | Source: Ambulatory Visit | Attending: Cardiology | Admitting: Cardiology

## 2022-05-11 ENCOUNTER — Ambulatory Visit (HOSPITAL_COMMUNITY)
Admission: RE | Admit: 2022-05-11 | Discharge: 2022-05-11 | Disposition: A | Discharge status: Home / Self Care | Payer: Medicare Other | Source: Ambulatory Visit | Attending: Cardiology | Admitting: Cardiology

## 2022-05-11 DIAGNOSIS — R931 Abnormal findings on diagnostic imaging of heart and coronary circulation: Secondary | ICD-10-CM

## 2022-05-11 DIAGNOSIS — R072 Precordial pain: Secondary | ICD-10-CM | POA: Diagnosis not present

## 2022-05-11 DIAGNOSIS — I2510 Atherosclerotic heart disease of native coronary artery without angina pectoris: Secondary | ICD-10-CM | POA: Diagnosis not present

## 2022-05-11 MED ORDER — NITROGLYCERIN 0.4 MG SL SUBL
0.8000 mg | SUBLINGUAL_TABLET | Freq: Once | SUBLINGUAL | Status: AC
  Administered 2022-05-11: 0.8 mg via SUBLINGUAL

## 2022-05-11 MED ORDER — NITROGLYCERIN 0.4 MG SL SUBL
SUBLINGUAL_TABLET | SUBLINGUAL | Status: AC
  Filled 2022-05-11: qty 2

## 2022-05-11 MED ORDER — IOHEXOL 350 MG/ML SOLN
100.0000 mL | Freq: Once | INTRAVENOUS | Status: AC | PRN
  Administered 2022-05-11: 100 mL via INTRAVENOUS

## 2022-05-11 NOTE — Progress Notes (Signed)
r 

## 2022-07-02 DIAGNOSIS — U071 COVID-19: Secondary | ICD-10-CM | POA: Diagnosis not present

## 2022-07-05 ENCOUNTER — Emergency Department (HOSPITAL_BASED_OUTPATIENT_CLINIC_OR_DEPARTMENT_OTHER): Payer: Medicare Other

## 2022-07-05 ENCOUNTER — Inpatient Hospital Stay (HOSPITAL_BASED_OUTPATIENT_CLINIC_OR_DEPARTMENT_OTHER)
Admission: EM | Admit: 2022-07-05 | Discharge: 2022-07-11 | DRG: 391 | Disposition: A | Discharge status: Home / Self Care | Payer: Medicare Other | Attending: Family Medicine | Admitting: Family Medicine

## 2022-07-05 ENCOUNTER — Other Ambulatory Visit: Payer: Self-pay

## 2022-07-05 DIAGNOSIS — Z79899 Other long term (current) drug therapy: Secondary | ICD-10-CM

## 2022-07-05 DIAGNOSIS — E119 Type 2 diabetes mellitus without complications: Secondary | ICD-10-CM | POA: Diagnosis not present

## 2022-07-05 DIAGNOSIS — Z8249 Family history of ischemic heart disease and other diseases of the circulatory system: Secondary | ICD-10-CM | POA: Diagnosis not present

## 2022-07-05 DIAGNOSIS — R1030 Lower abdominal pain, unspecified: Secondary | ICD-10-CM

## 2022-07-05 DIAGNOSIS — K668 Other specified disorders of peritoneum: Secondary | ICD-10-CM | POA: Diagnosis present

## 2022-07-05 DIAGNOSIS — Z881 Allergy status to other antibiotic agents status: Secondary | ICD-10-CM

## 2022-07-05 DIAGNOSIS — M19042 Primary osteoarthritis, left hand: Secondary | ICD-10-CM | POA: Diagnosis present

## 2022-07-05 DIAGNOSIS — Z7902 Long term (current) use of antithrombotics/antiplatelets: Secondary | ICD-10-CM | POA: Diagnosis not present

## 2022-07-05 DIAGNOSIS — Z720 Tobacco use: Secondary | ICD-10-CM | POA: Diagnosis not present

## 2022-07-05 DIAGNOSIS — R197 Diarrhea, unspecified: Secondary | ICD-10-CM

## 2022-07-05 DIAGNOSIS — Z96643 Presence of artificial hip joint, bilateral: Secondary | ICD-10-CM | POA: Diagnosis present

## 2022-07-05 DIAGNOSIS — E785 Hyperlipidemia, unspecified: Secondary | ICD-10-CM | POA: Diagnosis not present

## 2022-07-05 DIAGNOSIS — E860 Dehydration: Secondary | ICD-10-CM | POA: Diagnosis not present

## 2022-07-05 DIAGNOSIS — M19041 Primary osteoarthritis, right hand: Secondary | ICD-10-CM | POA: Diagnosis present

## 2022-07-05 DIAGNOSIS — K5732 Diverticulitis of large intestine without perforation or abscess without bleeding: Secondary | ICD-10-CM | POA: Diagnosis not present

## 2022-07-05 DIAGNOSIS — Z888 Allergy status to other drugs, medicaments and biological substances status: Secondary | ICD-10-CM

## 2022-07-05 DIAGNOSIS — F17210 Nicotine dependence, cigarettes, uncomplicated: Secondary | ICD-10-CM | POA: Diagnosis not present

## 2022-07-05 DIAGNOSIS — E871 Hypo-osmolality and hyponatremia: Secondary | ICD-10-CM | POA: Diagnosis present

## 2022-07-05 DIAGNOSIS — R1031 Right lower quadrant pain: Secondary | ICD-10-CM | POA: Diagnosis not present

## 2022-07-05 DIAGNOSIS — K5700 Diverticulitis of small intestine with perforation and abscess without bleeding: Principal | ICD-10-CM

## 2022-07-05 DIAGNOSIS — U071 COVID-19: Secondary | ICD-10-CM | POA: Diagnosis not present

## 2022-07-05 DIAGNOSIS — I700 Atherosclerosis of aorta: Secondary | ICD-10-CM | POA: Diagnosis present

## 2022-07-05 DIAGNOSIS — I10 Essential (primary) hypertension: Secondary | ICD-10-CM | POA: Diagnosis not present

## 2022-07-05 DIAGNOSIS — J449 Chronic obstructive pulmonary disease, unspecified: Secondary | ICD-10-CM | POA: Diagnosis present

## 2022-07-05 DIAGNOSIS — K5720 Diverticulitis of large intestine with perforation and abscess without bleeding: Secondary | ICD-10-CM | POA: Diagnosis not present

## 2022-07-05 DIAGNOSIS — Z7982 Long term (current) use of aspirin: Secondary | ICD-10-CM

## 2022-07-05 DIAGNOSIS — D72829 Elevated white blood cell count, unspecified: Secondary | ICD-10-CM | POA: Diagnosis not present

## 2022-07-05 DIAGNOSIS — E782 Mixed hyperlipidemia: Secondary | ICD-10-CM | POA: Diagnosis not present

## 2022-07-05 DIAGNOSIS — R5381 Other malaise: Secondary | ICD-10-CM | POA: Diagnosis not present

## 2022-07-05 LAB — URINALYSIS, ROUTINE W REFLEX MICROSCOPIC
Bilirubin Urine: NEGATIVE
Glucose, UA: NEGATIVE mg/dL
Ketones, ur: 40 mg/dL — AB
Leukocytes,Ua: NEGATIVE
Nitrite: NEGATIVE
Protein, ur: 30 mg/dL — AB
Specific Gravity, Urine: <1.005 — ABNORMAL LOW (ref 1.005–1.030)
pH: 7.5 (ref 5.0–8.0)

## 2022-07-05 LAB — COMPREHENSIVE METABOLIC PANEL
ALT: 41 U/L (ref 0–44)
AST: 41 U/L (ref 15–41)
Albumin: 4.1 g/dL (ref 3.5–5.0)
Alkaline Phosphatase: 132 U/L — ABNORMAL HIGH (ref 38–126)
Anion gap: 12 (ref 5–15)
BUN: 9 mg/dL (ref 8–23)
CO2: 24 mmol/L (ref 22–32)
Calcium: 9.6 mg/dL (ref 8.9–10.3)
Chloride: 93 mmol/L — ABNORMAL LOW (ref 98–111)
Creatinine, Ser: 0.47 mg/dL (ref 0.44–1.00)
GFR, Estimated: >60 mL/min (ref >60)
Glucose, Bld: 188 mg/dL — ABNORMAL HIGH (ref 70–99)
Potassium: 4.2 mmol/L (ref 3.5–5.1)
Sodium: 129 mmol/L — ABNORMAL LOW (ref 135–145)
Total Bilirubin: 0.9 mg/dL (ref 0.3–1.2)
Total Protein: 7 g/dL (ref 6.5–8.1)

## 2022-07-05 LAB — URINALYSIS, MICROSCOPIC (REFLEX)

## 2022-07-05 LAB — LIPASE, BLOOD: Lipase: 17 U/L (ref 11–51)

## 2022-07-05 LAB — CBC
HCT: 40.3 % (ref 36.0–46.0)
Hemoglobin: 13.9 g/dL (ref 12.0–15.0)
MCH: 30.6 pg (ref 26.0–34.0)
MCHC: 34.5 g/dL (ref 30.0–36.0)
MCV: 88.8 fL (ref 80.0–100.0)
Platelets: 238 10*3/uL (ref 150–400)
RBC: 4.54 MIL/uL (ref 3.87–5.11)
RDW: 12.5 % (ref 11.5–15.5)
WBC: 10.3 10*3/uL (ref 4.0–10.5)
nRBC: 0 % (ref 0.0–0.2)

## 2022-07-05 MED ORDER — SODIUM CHLORIDE 0.9 % IV BOLUS
1000.0000 mL | Freq: Once | INTRAVENOUS | Status: AC
  Administered 2022-07-05: 1000 mL via INTRAVENOUS

## 2022-07-05 MED ORDER — CIPROFLOXACIN IN D5W 400 MG/200ML IV SOLN
400.0000 mg | Freq: Once | INTRAVENOUS | Status: AC
  Administered 2022-07-05: 400 mg via INTRAVENOUS
  Filled 2022-07-05: qty 200

## 2022-07-05 MED ORDER — METRONIDAZOLE 500 MG/100ML IV SOLN
500.0000 mg | Freq: Once | INTRAVENOUS | Status: AC
Start: 2022-07-05 — End: 2022-07-05
  Administered 2022-07-05: 500 mg via INTRAVENOUS
  Filled 2022-07-05: qty 100

## 2022-07-05 MED ORDER — IOHEXOL 300 MG/ML  SOLN
100.0000 mL | Freq: Once | INTRAMUSCULAR | Status: AC | PRN
  Administered 2022-07-05: 60 mL via INTRAVENOUS

## 2022-07-05 MED ORDER — MOLNUPIRAVIR EUA 200MG CAPSULE
4.0000 | ORAL_CAPSULE | Freq: Once | ORAL | Status: AC
  Administered 2022-07-05: 800 mg via ORAL

## 2022-07-05 NOTE — ED Provider Notes (Signed)
[facility] EMERGENCY DEPT Provider Note   CSN: [id number] Arrival date & time: 07/05/22  1337     History  Chief Complaint  Patient presents with   Abdominal Pain    COVID positive    Melissa Waters is a 71 y.o. female.  Patient presents to the hospital complaining of severe lower abdominal pain.  Patient was diagnosed with COVID-19 on Friday and started taking Lagevrio as prescribed.  She has had multiple episodes of diarrhea since Friday.  She is nausea and vomiting.  She states that the pain is equal in severity as to when she dislocated her hip in the past.  She denies shortness of breath, cough, headache, chest pain, urinary symptoms at this time.  Past medical history significant for hypertension, COPD, type II DM without complication, diverticulosis history, nephritis, osteoarthritis, heart murmur  HPI     Home Medications Prior to Admission medications   Medication Sig Start Date End Date Taking? Authorizing Provider  aspirin EC 81 MG tablet Take 1 tablet (81 mg total) by mouth daily. 11/23/13   Lars Masson, MD  BIOTIN PO Take 1 tablet by mouth daily.    [provider]  Carboxymethylcellul-Glycerin (CLEAR EYES FOR DRY EYES OP) Place 1 drop into both eyes 2 (two) times daily. Clear Eyes for Dry Eyes    [provider]  carvedilol (COREG) 3.125 MG tablet Take 1 tablet (3.125 mg total) by mouth 2 (two) times daily with a meal. 04/29/22   Meriam Sprague, MD  Cholecalciferol (VITAMIN D3) 5000 units CAPS Take 1 capsule by mouth daily.    [provider]  ezetimibe (ZETIA) 10 MG tablet Take 1 tablet (10 mg total) by mouth daily. 07/13/21   Meriam Sprague, MD  isosorbide mononitrate (IMDUR) 30 MG 24 hr tablet Take 1 tablet (30 mg total) by mouth daily. 07/13/21   Meriam Sprague, MD  meloxicam (MOBIC) 15 MG tablet Take 15 mg by mouth daily.    [provider]  metoprolol tartrate (LOPRESSOR) 100 MG tablet Take  1 tablet (100 mg total) by mouth once for 1 dose. Take 90-120 minutes prior to scan. 04/19/22 04/19/22  Meriam Sprague, MD  MILK THISTLE PO Take 350 mg by mouth daily.    [provider]  nitroGLYCERIN (NITROSTAT) 0.4 MG SL tablet Place 1 tablet (0.4 mg total) under the tongue every 5 (five) minutes as needed for chest pain. 12/05/20   Meriam Sprague, MD  Probiotic Product (PHILLIPS COLON HEALTH PO) Take 1 capsule by mouth daily.    [provider]  valsartan (DIOVAN) 160 MG tablet Take 160 mg by mouth daily.    [provider]  vitamin C (ASCORBIC ACID) 500 MG tablet Take 500 mg by mouth daily.    [provider]      Allergies    Amoxil [amoxicillin], Metformin, Pravastatin, and Rosuvastatin    Review of Systems   Review of Systems  Constitutional:  Negative for fever.  Respiratory:  Negative for cough and shortness of breath.   Cardiovascular:  Negative for chest pain.  Gastrointestinal:  Positive for abdominal pain and diarrhea. Negative for constipation, nausea and vomiting.  Genitourinary:  Negative for dysuria.  Neurological:  Negative for headaches.    Physical Exam Updated Vital Signs BP 119/73 (BP Location: Right Arm)   Pulse 89   Temp 100 F (37.8 C) (Oral)   Resp 16   Ht 4\' 11"  (1.499 m)  Wt 47.6 kg   SpO2 98%   BMI 21.21 kg/m  Physical Exam Vitals and nursing note reviewed.  Constitutional:      General: She is not in acute distress.    Appearance: She is well-developed.  HENT:     Head: Normocephalic and atraumatic.     Mouth/Throat:     Mouth: Mucous membranes are moist.  Eyes:     Extraocular Movements: Extraocular movements intact.     Conjunctiva/sclera: Conjunctivae normal.  Cardiovascular:     Rate and Rhythm: Normal rate and regular rhythm.  Pulmonary:     Effort: Pulmonary effort is normal. No respiratory distress.     Breath sounds: Normal breath sounds.  Abdominal:     General: Abdomen is flat.  There is no distension.     Palpations: Abdomen is soft.     Tenderness: There is abdominal tenderness in the right lower quadrant, periumbilical area, suprapubic area and left lower quadrant.  Musculoskeletal:        General: No swelling.     Cervical back: Neck supple.  Skin:    General: Skin is warm and dry.     Capillary Refill: Capillary refill takes less than 2 seconds.  Neurological:     Mental Status: She is alert.  Psychiatric:        Mood and Affect: Mood normal.     ED Results / Procedures / Treatments   Labs (all labs ordered are listed, but only abnormal results are displayed) Labs Reviewed  COMPREHENSIVE METABOLIC PANEL - Abnormal; Notable for the following components:      Result Value   Sodium 129 (*)    Chloride 93 (*)    Glucose, Bld 188 (*)    Alkaline Phosphatase 132 (*)    All other components within normal limits  URINALYSIS, ROUTINE W REFLEX MICROSCOPIC - Abnormal; Notable for the following components:   Specific Gravity, Urine <1.005 (*)    Hgb urine dipstick TRACE (*)    Ketones, ur 40 (*)    Protein, ur 30 (*)    All other components within normal limits  URINALYSIS, MICROSCOPIC (REFLEX) - Abnormal; Notable for the following components:   Bacteria, UA FEW (*)    All other components within normal limits  LIPASE, BLOOD  CBC    EKG None  Radiology CT Abdomen Pelvis W Contrast  Addendum Date: 07/05/2022   ADDENDUM REPORT: 07/05/2022 17:46 ADDENDUM: These results were called by telephone at the time of interpretation on 07/05/2022 at 5:45 pm to provider Dr. David Stall, who verbally acknowledged these results. Electronically Signed   By: Tish Frederickson M.D.   On: 07/05/2022 17:46   Result Date: 07/05/2022 CLINICAL DATA:  Abdominal pain, acute, nonlocalized. Pt to ED known COVID positive, tested positive on Friday, taking Lagevrio as prescribed. Here today for increased weakness, abdominal pain, diarrhea. EXAM: CT ABDOMEN AND PELVIS WITH CONTRAST  TECHNIQUE: Multidetector CT imaging of the abdomen and pelvis was performed using the standard protocol following bolus administration of intravenous contrast. RADIATION DOSE REDUCTION: This exam was performed according to the departmental dose-optimization program which includes automated exposure control, adjustment of the mA and/or kV according to patient size and/or use of iterative reconstruction technique. CONTRAST:  82mL OMNIPAQUE IOHEXOL 300 MG/ML  SOLN COMPARISON:  CT chest 05/06/2022, CT hip 06/21/2013 FINDINGS: Lower chest: No acute abnormality.  Coronary artery calcification. Hepatobiliary: No focal liver abnormality. No gallstones, gallbladder wall thickening, or pericholecystic fluid. No biliary dilatation. Pancreas: No focal lesion.  Normal pancreatic contour. No surrounding inflammatory changes. No main pancreatic ductal dilatation. Spleen: Normal in size without focal abnormality. Adrenals/Urinary Tract: No adrenal nodule bilaterally. Bilateral kidneys enhance symmetrically. Subcentimeter hypodensities are too small to characterize. No hydronephrosis. No hydroureter. The urinary bladder is unremarkable. Stomach/Bowel: Stomach is within normal limits. No evidence of small bowel wall thickening or dilatation. Colonic diverticulosis. Bowel wall thickening and pericolonic fat stranding of the mid sigmoid colon. Marked inflammatory changes surrounding the mid sigmoid colon within the pelvis. No definite intramural abscess formation. Appendix appears normal. Vascular/Lymphatic: No abdominal aorta or iliac aneurysm. Mild atherosclerotic plaque of the aorta and its branches. No abdominal, pelvic, or inguinal lymphadenopathy. Reproductive: Status post hysterectomy. No adnexal masses. Other: No intraperitoneal free fluid. Trace volume free intraperitoneal gas. No organized fluid collection. Musculoskeletal: No abdominal wall hernia or abnormality. No suspicious lytic or blastic osseous lesions. No acute  displaced fracture. Levoscoliosis centered at the L2 with associated multilevel degenerative changes of the spine. Bilateral total hip arthroplasty. 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 IMPRESSION: 1. Colonic diverticulosis with perforated acute sigmoid diverticulitis. Trace volume pneumoperitoneum. Recommend colonoscopy status post treatment and status post complete resolution of inflammatory changes to exclude an underlying lesion. 2. Aortic Atherosclerosis (ICD10-I70.0) including coronary artery calcification. Electronically Signed: By: Tish Frederickson M.D. On: 07/05/2022 17:42    Procedures Procedures    Medications Ordered in ED Medications  sodium chloride 0.9 % bolus 1,000 mL (0 mLs Intravenous Stopped 07/05/22 2033)  iohexol (OMNIPAQUE) 300 MG/ML solution 100 mL (60 mLs Intravenous Contrast Given 07/05/22 1718)  ciprofloxacin (CIPRO) IVPB 400 mg (0 mg Intravenous Stopped 07/05/22 2033)  metroNIDAZOLE (FLAGYL) IVPB 500 mg (0 mg Intravenous Stopped 07/05/22 2104)  **Patient Supplied*Molnupiravir EUA (LAGEVRIO) capsule 800 mg (800 mg Oral Given 07/05/22 2100)    ED Course/ Medical Decision Making/ A&P                           Medical Decision Making Amount and/or Complexity of Data Reviewed Labs: ordered. Radiology: ordered.  Risk Prescription drug management. Decision regarding hospitalization.   This patient presents to the ED for concern of abdominal pain, this involves an extensive number of treatment options, and is a complaint that carries with it a high risk of complications and morbidity.  The differential diagnosis includes gastritis, gastroenteritis, diverticulitis, colitis, appendicitis, cholecystitis, and others   Co morbidities that complicate the patient evaluation  History of diverticulosis   Additional history obtained:  Additional history obtained from family at bedside External records from outside source obtained and reviewed including office visit for  type 2 diabetes   Lab Tests:  I Ordered, and personally interpreted labs.  The pertinent results include: Grossly unremarkable UA, CBC, lipase; sodium 129   Imaging Studies ordered:  I ordered imaging studies including CT abdomen pelvis with contrast I independently visualized and interpreted imaging which showed  1. Colonic diverticulosis with perforated acute sigmoid  diverticulitis. Trace volume pneumoperitoneum. Recommend colonoscopy  status post treatment and status post complete resolution of  inflammatory changes to exclude an underlying lesion.  2. Aortic Atherosclerosis (ICD10-I70.0) including coronary artery  calcification.   I agree with the radiologist interpretation   Consultations Obtained:  I requested consultation with the general surgeon, Dr. Carolynne Edouard,  and discussed lab and imaging findings as well as pertinent plan - they recommend: Continued IV antibiotics and admission to medicine.  Plan to consult on patient in person once patient is  at either [facility] or [facility]  I requested consultation with the hospitalist, Dr. Alcario Drought agreed to admit the patient.  Plan admission is at [facility] based on bed availability.  Dr. Alcario Drought recommended ED to ED transfer in case patient began to deteriorate with pneumoperitoneum.  Dr. Tamera Punt at [facility] hospital agreed to take the patient in ED to ED transfer.   Problem List / ED Course / Critical interventions / Medication management   I ordered medication including Cipro and Flagyl for antibiotic coverage, saline for hydration Reevaluation of the patient after these medicines showed that the patient stayed the same I have reviewed the patients home medicines and have made adjustments as needed    Test / Admission - Considered:  The patient has a pneumoperitoneum with a perforated diverticulitis.  The patient needs admission to the hospital for possible surgical intervention and continued IV therapy.  Admit to  hospital.        Final Clinical Impression(s) / ED Diagnoses Final diagnoses:  Perforated diverticulum of small intestine  Lower abdominal pain  Diarrhea, unspecified type    Rx / DC Orders ED Discharge Orders     None         Ronny Bacon 07/05/22 2110    Cristie Hem, MD 07/06/22 1207

## 2022-07-05 NOTE — ED Triage Notes (Signed)
Pt to ED known COVID positive, tested positive on Friday, taking Lagevrio as prescribed. Here today for increased weakness, abdominal pain, diarrhea.

## 2022-07-05 NOTE — ED Notes (Signed)
Report given to [facility]. 

## 2022-07-05 NOTE — ED Provider Notes (Signed)
Patient presents as a transfer from [facility] emergency department.  She recently had a positive COVID test 3 days ago.  She has been on mulnupiravir.  She has had some progressively worsening abdominal pain.  She was noted on imaging to have a perforated diverticulitis.  Surgery was consulted and recommended antibiotics and admission to the hospitalist service.  Antibiotics were given at med center [facility].  I did speak with the patient placement nurse who will send one of the hospitalist down to admit the patient.   Malvin Johns, MD 07/05/22 2257

## 2022-07-05 NOTE — Consult Note (Signed)
Reason for Consult: Abdominal pain Referring Physician: Dr. [name] is an 71 y.o. female.  HPI: The patient is a 71 year old white female who initially presented on Friday with upper respiratory symptoms.  Her and her husband took an at home COVID test and both were positive.  She sought medical attention and was started on antivirals.  On that same day she was having fevers and also complaining of some abdominal pain.  She has had some yellowy mucousy diarrhea.  She complains of some suprapubic gas pains.  She went to the emergency department where a CT scan showed evidence of diverticulitis with microperforation.  Her white count was 10.3  Past Medical History:  Diagnosis Date   COPD (chronic obstructive pulmonary disease) (HCC)    Diabetes mellitus without complication (HCC)    no meds    Diverticulosis    Hx: of   Heart murmur    as a baby   Hypertension    Nephritis    Hx: of 1958   Osteoarthritis    right hip   Osteoarthritis of left hip 08/13/2013    Past Surgical History:  Procedure Laterality Date   COLONOSCOPY W/ POLYPECTOMY  2006   TOTAL HIP ARTHROPLASTY Right    05/27/2009   TOTAL HIP ARTHROPLASTY Left 08/13/2013   Procedure: TOTAL HIP ARTHROPLASTY;  Surgeon: Eulas Post, MD;  Location: [facility] OR;  Service: Orthopedics;  Laterality: Left;    Family History  Problem Relation Age of Onset   Heart Problems Father 31       CABG   Hypertension Mother    Breast cancer Mother    Heart attack Other 37       fatal MI    Social History:  reports that she has been smoking cigarettes. She has a 61.25 pack-year smoking history. She has never used smokeless tobacco. She reports current alcohol use. She reports that she does not use drugs.  Allergies:  Allergies  Allergen Reactions   Amoxil [Amoxicillin] Diarrhea and Nausea And Vomiting   Metformin Other (See Comments)   Pravastatin     Leg pain   Rosuvastatin     Memory problems    Medications: I have  reviewed the patient's current medications.  Results for orders placed or performed during the hospital encounter of 07/05/22 (from the past 48 hour(s))  Lipase, blood     Status: None   Collection Time: 07/05/22  2:11 PM  Result Value Ref Range   Lipase 17 11 - 51 U/L    Comment: Performed at [facility], [address], [place], [place] [address]  Comprehensive metabolic panel     Status: Abnormal   Collection Time: 07/05/22  2:11 PM  Result Value Ref Range   Sodium 129 (L) 135 - 145 mmol/L   Potassium 4.2 3.5 - 5.1 mmol/L   Chloride 93 (L) 98 - 111 mmol/L   CO2 24 22 - 32 mmol/L   Glucose, Bld 188 (H) 70 - 99 mg/dL    Comment: Glucose reference range applies only to samples taken after fasting for at least 8 hours.   BUN 9 8 - 23 mg/dL   Creatinine, Ser 1.09 0.44 - 1.00 mg/dL   Calcium 9.6 8.9 - 32.3 mg/dL   Total Protein 7.0 6.5 - 8.1 g/dL   Albumin 4.1 3.5 - 5.0 g/dL   AST 41 15 - 41 U/L   ALT 41 0 - 44 U/L   Alkaline Phosphatase 132 (  H) 38 - 126 U/L   Total Bilirubin 0.9 0.3 - 1.2 mg/dL   GFR, Estimated >60 >60 mL/min    Comment: (NOTE) Calculated using the CKD-EPI Creatinine Equation (2021)    Anion gap 12 5 - 15    Comment: Performed at [facility], [address] [place], [place] [address]  CBC     Status: None   Collection Time: 07/05/22  2:11 PM  Result Value Ref Range   WBC 10.3 4.0 - 10.5 K/uL   RBC 4.54 3.87 - 5.11 MIL/uL   Hemoglobin 13.9 12.0 - 15.0 g/dL   HCT 40.3 36.0 - 46.0 %   MCV 88.8 80.0 - 100.0 fL   MCH 30.6 26.0 - 34.0 pg   MCHC 34.5 30.0 - 36.0 g/dL   RDW 12.5 11.5 - 15.5 %   Platelets 238 150 - 400 K/uL   nRBC 0.0 0.0 - 0.2 %    Comment: Performed at [facility], [address] [place], [place] [address]  Urinalysis, Routine w reflex microscopic     Status: Abnormal   Collection Time: 07/05/22  7:01 PM  Result Value Ref Range   Color, Urine YELLOW YELLOW   APPearance CLEAR  CLEAR   Specific Gravity, Urine <1.005 (L) 1.005 - 1.030   pH 7.5 5.0 - 8.0   Glucose, UA NEGATIVE NEGATIVE mg/dL   Hgb urine dipstick TRACE (A) NEGATIVE   Bilirubin Urine NEGATIVE NEGATIVE   Ketones, ur 40 (A) NEGATIVE mg/dL   Protein, ur 30 (A) NEGATIVE mg/dL   Nitrite NEGATIVE NEGATIVE   Leukocytes,Ua NEGATIVE NEGATIVE    Comment: Performed at [facility], [place] [address]  Urinalysis, Microscopic (reflex)     Status: Abnormal   Collection Time: 07/05/22  7:01 PM  Result Value Ref Range   RBC / HPF 0-5 0 - 5 RBC/hpf   WBC, UA 0-5 0 - 5 WBC/hpf   Bacteria, UA FEW (A) NONE SEEN   Squamous Epithelial / LPF 0-5 0 - 5    Comment: Performed at [facility], [address] [place], [place] [address]    CT Abdomen Pelvis W Contrast  Addendum Date: 07/05/2022   ADDENDUM REPORT: 07/05/2022 17:46 ADDENDUM: These results were called by telephone at the time of interpretation on 07/05/2022 at 5:45 pm to provider Dr. Amador Cunas, who verbally acknowledged these results. Electronically Signed   By: Iven Finn M.D.   On: 07/05/2022 17:46   Result Date: 07/05/2022 CLINICAL DATA:  Abdominal pain, acute, nonlocalized. Pt to ED known COVID positive, tested positive on Friday, taking Lagevrio as prescribed. Here today for increased weakness, abdominal pain, diarrhea. EXAM: CT ABDOMEN AND PELVIS WITH CONTRAST TECHNIQUE: Multidetector CT imaging of the abdomen and pelvis was performed using the standard protocol following bolus administration of intravenous contrast. RADIATION DOSE REDUCTION: This exam was performed according to the departmental dose-optimization program which includes automated exposure control, adjustment of the mA and/or kV according to patient size and/or use of iterative reconstruction technique. CONTRAST:  70mL OMNIPAQUE IOHEXOL 300 MG/ML  SOLN COMPARISON:  CT chest 05/06/2022, CT hip 06/21/2013 FINDINGS: Lower chest: No  acute abnormality.  Coronary artery calcification. Hepatobiliary: No focal liver abnormality. No gallstones, gallbladder wall thickening, or pericholecystic fluid. No biliary dilatation. Pancreas: No focal lesion. Normal pancreatic contour. No surrounding inflammatory changes. No main pancreatic ductal dilatation. Spleen: Normal in size without focal abnormality. Adrenals/Urinary Tract: No adrenal nodule bilaterally. Bilateral kidneys enhance symmetrically. Subcentimeter  hypodensities are too small to characterize. No hydronephrosis. No hydroureter. The urinary bladder is unremarkable. Stomach/Bowel: Stomach is within normal limits. No evidence of small bowel wall thickening or dilatation. Colonic diverticulosis. Bowel wall thickening and pericolonic fat stranding of the mid sigmoid colon. Marked inflammatory changes surrounding the mid sigmoid colon within the pelvis. No definite intramural abscess formation. Appendix appears normal. Vascular/Lymphatic: No abdominal aorta or iliac aneurysm. Mild atherosclerotic plaque of the aorta and its branches. No abdominal, pelvic, or inguinal lymphadenopathy. Reproductive: Status post hysterectomy. No adnexal masses. Other: No intraperitoneal free fluid. Trace volume free intraperitoneal gas. No organized fluid collection. Musculoskeletal: No abdominal wall hernia or abnormality. No suspicious lytic or blastic osseous lesions. No acute displaced fracture. Levoscoliosis centered at the L2 with associated multilevel degenerative changes of the spine. Bilateral total hip arthroplasty. 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 IMPRESSION: 1. Colonic diverticulosis with perforated acute sigmoid diverticulitis. Trace volume pneumoperitoneum. Recommend colonoscopy status post treatment and status post complete resolution of inflammatory changes to exclude an underlying lesion. 2. Aortic Atherosclerosis (ICD10-I70.0) including coronary artery calcification. Electronically Signed:  By: Tish Frederickson M.D. On: 07/05/2022 17:42    Review of Systems  Constitutional:  Positive for fever.  HENT: Negative.    Eyes: Negative.   Respiratory:  Positive for cough.   Cardiovascular: Negative.   Gastrointestinal:  Positive for abdominal pain, diarrhea and nausea.  Endocrine: Negative.   Genitourinary: Negative.   Musculoskeletal: Negative.   Skin: Negative.   Allergic/Immunologic: Negative.   Neurological: Negative.   Hematological: Negative.   Psychiatric/Behavioral: Negative.     Blood pressure 123/78, pulse 92, temperature 100 F (37.8 C), temperature source Oral, resp. rate 16, height 4\' 11"  (1.499 m), weight 47.6 kg, SpO2 95 %. Physical Exam Vitals reviewed.  Constitutional:      General: She is not in acute distress.    Appearance: Normal appearance.  HENT:     Head: Normocephalic and atraumatic.     Right Ear: External ear normal.     Left Ear: External ear normal.     Nose: Nose normal.     Mouth/Throat:     Mouth: Mucous membranes are moist.     Pharynx: Oropharynx is clear.  Eyes:     General: No scleral icterus.    Extraocular Movements: Extraocular movements intact.     Conjunctiva/sclera: Conjunctivae normal.     Pupils: Pupils are equal, round, and reactive to light.  Cardiovascular:     Rate and Rhythm: Normal rate and regular rhythm.     Pulses: Normal pulses.     Heart sounds: Normal heart sounds.  Pulmonary:     Effort: Pulmonary effort is normal. No respiratory distress.     Breath sounds: Normal breath sounds.  Abdominal:     General: Abdomen is flat. Bowel sounds are normal.     Palpations: Abdomen is soft.     Comments: There is mild suprapubic tenderness.  There is no generalized peritonitis.  The rest of the abdomen is soft to palpation and nontender  Musculoskeletal:        General: No swelling or deformity. Normal range of motion.     Cervical back: Normal range of motion. No rigidity.  Skin:    General: Skin is warm and dry.      Coloration: Skin is not jaundiced.  Neurological:     General: No focal deficit present.     Mental Status: She is alert and oriented to person, place,  and time.  Psychiatric:        Mood and Affect: Mood normal.        Behavior: Behavior normal.     Assessment/Plan: The patient appears to have acute sigmoid diverticulitis with microperforation.  She does not have generalized peritonitis and actually has only mild suprapubic tenderness and the rest of the abdomen is soft.  Because of this it would be reasonable to have her admitted to the medical service for bowel rest and IV antibiotic therapy.  The surgery team can follow closely with her in case she worsens.  There is certainly a chance she could get better without urgent surgery and possibly avoid a colostomy bag.  We will follow her closely with you.  She also has COVID which may be accounting for some of her fevers as well.  Treatment will be determined by the medical team for this.  Melissa Waters III 07/05/2022, 11:16 PM

## 2022-07-05 NOTE — ED Notes (Addendum)
Report given to the Charge of [facility] ED.

## 2022-07-06 DIAGNOSIS — E871 Hypo-osmolality and hyponatremia: Secondary | ICD-10-CM

## 2022-07-06 DIAGNOSIS — K668 Other specified disorders of peritoneum: Secondary | ICD-10-CM | POA: Diagnosis not present

## 2022-07-06 DIAGNOSIS — U071 COVID-19: Secondary | ICD-10-CM | POA: Diagnosis not present

## 2022-07-06 DIAGNOSIS — I10 Essential (primary) hypertension: Secondary | ICD-10-CM

## 2022-07-06 DIAGNOSIS — E782 Mixed hyperlipidemia: Secondary | ICD-10-CM

## 2022-07-06 DIAGNOSIS — Z720 Tobacco use: Secondary | ICD-10-CM

## 2022-07-06 DIAGNOSIS — K5720 Diverticulitis of large intestine with perforation and abscess without bleeding: Secondary | ICD-10-CM | POA: Diagnosis not present

## 2022-07-06 LAB — CBC WITH DIFFERENTIAL/PLATELET
Abs Immature Granulocytes: 0.04 10*3/uL (ref 0.00–0.07)
Basophils Absolute: 0 10*3/uL (ref 0.0–0.1)
Basophils Relative: 0 %
Eosinophils Absolute: 0 10*3/uL (ref 0.0–0.5)
Eosinophils Relative: 0 %
HCT: 35.5 % — ABNORMAL LOW (ref 36.0–46.0)
Hemoglobin: 12.1 g/dL (ref 12.0–15.0)
Immature Granulocytes: 0 %
Lymphocytes Relative: 9 %
Lymphs Abs: 1 10*3/uL (ref 0.7–4.0)
MCH: 30.6 pg (ref 26.0–34.0)
MCHC: 34.1 g/dL (ref 30.0–36.0)
MCV: 89.6 fL (ref 80.0–100.0)
Monocytes Absolute: 1.1 10*3/uL — ABNORMAL HIGH (ref 0.1–1.0)
Monocytes Relative: 10 %
Neutro Abs: 8.6 10*3/uL — ABNORMAL HIGH (ref 1.7–7.7)
Neutrophils Relative %: 81 %
Platelets: 249 10*3/uL (ref 150–400)
RBC: 3.96 MIL/uL (ref 3.87–5.11)
RDW: 12.6 % (ref 11.5–15.5)
WBC: 10.7 10*3/uL — ABNORMAL HIGH (ref 4.0–10.5)
nRBC: 0 % (ref 0.0–0.2)

## 2022-07-06 LAB — COMPREHENSIVE METABOLIC PANEL
ALT: 31 U/L (ref 0–44)
AST: 24 U/L (ref 15–41)
Albumin: 3.1 g/dL — ABNORMAL LOW (ref 3.5–5.0)
Alkaline Phosphatase: 121 U/L (ref 38–126)
Anion gap: 9 (ref 5–15)
BUN: 7 mg/dL — ABNORMAL LOW (ref 8–23)
CO2: 21 mmol/L — ABNORMAL LOW (ref 22–32)
Calcium: 8.8 mg/dL — ABNORMAL LOW (ref 8.9–10.3)
Chloride: 102 mmol/L (ref 98–111)
Creatinine, Ser: 0.32 mg/dL — ABNORMAL LOW (ref 0.44–1.00)
GFR, Estimated: >60 mL/min (ref >60)
Glucose, Bld: 125 mg/dL — ABNORMAL HIGH (ref 70–99)
Potassium: 3.8 mmol/L (ref 3.5–5.1)
Sodium: 132 mmol/L — ABNORMAL LOW (ref 135–145)
Total Bilirubin: 1.2 mg/dL (ref 0.3–1.2)
Total Protein: 6.2 g/dL — ABNORMAL LOW (ref 6.5–8.1)

## 2022-07-06 LAB — MAGNESIUM: Magnesium: 2 mg/dL (ref 1.7–2.4)

## 2022-07-06 LAB — TSH: TSH: 0.815 u[IU]/mL (ref 0.350–4.500)

## 2022-07-06 MED ORDER — CIPROFLOXACIN IN D5W 400 MG/200ML IV SOLN
400.0000 mg | Freq: Two times a day (BID) | INTRAVENOUS | Status: DC
  Administered 2022-07-06 – 2022-07-08 (×5): 400 mg via INTRAVENOUS
  Filled 2022-07-06 (×6): qty 200

## 2022-07-06 MED ORDER — HEPARIN SODIUM (PORCINE) 5000 UNIT/ML IJ SOLN
5000.0000 [IU] | Freq: Three times a day (TID) | INTRAMUSCULAR | Status: DC
  Administered 2022-07-06 – 2022-07-11 (×16): 5000 [IU] via SUBCUTANEOUS
  Filled 2022-07-06 (×16): qty 1

## 2022-07-06 MED ORDER — METRONIDAZOLE 500 MG/100ML IV SOLN
500.0000 mg | Freq: Two times a day (BID) | INTRAVENOUS | Status: DC
  Administered 2022-07-06 – 2022-07-11 (×11): 500 mg via INTRAVENOUS
  Filled 2022-07-06 (×11): qty 100

## 2022-07-06 MED ORDER — ACETAMINOPHEN 325 MG PO TABS: 650.0000 mg | ORAL_TABLET | Freq: Four times a day (QID) | ORAL | Status: DC | PRN

## 2022-07-06 MED ORDER — IRBESARTAN 75 MG PO TABS
75.0000 mg | ORAL_TABLET | Freq: Every day | ORAL | Status: DC
  Administered 2022-07-07 – 2022-07-11 (×5): 75 mg via ORAL
  Filled 2022-07-06 (×7): qty 1

## 2022-07-06 MED ORDER — EZETIMIBE 10 MG PO TABS
10.0000 mg | ORAL_TABLET | Freq: Every day | ORAL | Status: DC
  Administered 2022-07-07 – 2022-07-11 (×5): 10 mg via ORAL
  Filled 2022-07-06 (×5): qty 1

## 2022-07-06 MED ORDER — MOLNUPIRAVIR EUA 200MG CAPSULE
4.0000 | ORAL_CAPSULE | Freq: Two times a day (BID) | ORAL | Status: AC
  Administered 2022-07-06 – 2022-07-07 (×2): 800 mg via ORAL

## 2022-07-06 MED ORDER — OXYCODONE HCL 5 MG PO TABS: 2.5000 mg | ORAL_TABLET | ORAL | Status: DC | PRN

## 2022-07-06 MED ORDER — TRIMETHOBENZAMIDE HCL 100 MG/ML IM SOLN: 200.0000 mg | Freq: Four times a day (QID) | INTRAMUSCULAR | Status: DC | PRN

## 2022-07-06 MED ORDER — HYDROMORPHONE HCL 1 MG/ML IJ SOLN: 0.5000 mg | INTRAMUSCULAR | Status: DC | PRN

## 2022-07-06 MED ORDER — CARVEDILOL 3.125 MG PO TABS
3.1250 mg | ORAL_TABLET | Freq: Two times a day (BID) | ORAL | Status: DC
  Administered 2022-07-06 – 2022-07-11 (×10): 3.125 mg via ORAL
  Filled 2022-07-06 (×11): qty 1

## 2022-07-06 MED ORDER — SODIUM CHLORIDE 0.9 % IV SOLN: INTRAVENOUS | Status: DC

## 2022-07-06 MED ORDER — ACETAMINOPHEN 650 MG RE SUPP: 650.0000 mg | Freq: Four times a day (QID) | RECTAL | Status: DC | PRN

## 2022-07-06 MED ORDER — LACTATED RINGERS IV SOLN: INTRAVENOUS | Status: DC

## 2022-07-06 NOTE — Assessment & Plan Note (Signed)
Check TSH. Likely due to diarrhea. Continue with IV NS. Repeat BMP.

## 2022-07-06 NOTE — Assessment & Plan Note (Signed)
Admit to progressive bed. General surgery has seen patient and will follow pt as consultant. Per their note, no immediate need for surgery at this time.

## 2022-07-06 NOTE — Assessment & Plan Note (Signed)
Stable. Pt states she has quit for about 1 weeks. Declines nicotine patch.

## 2022-07-06 NOTE — Progress Notes (Signed)
PROGRESS NOTE  Melissa Waters JSH:[id number] DOB: 10-06-1950   PCP: Daisy Floro, MD  Patient is from: Home.  Lives with husband.  Independently ambulates at baseline.  DOA: 07/05/2022 LOS: 1  Chief complaints Chief Complaint  Patient presents with   Abdominal Pain    COVID positive     Brief Narrative / Interim history: 71 year old F with PMH of osteoarthritis, hypertension, hyperlipidemia and tobacco use disorder presenting with progressive abdominal pain for about a day and a half and diarrhea with mucoid stool, and admitted for sigmoid diverticulitis with perforation and trace volume pneumoperitoneum.  Surgery recommended conservative care with IV antibiotics and bowel rest.   Of note, patient and husband tested positive for COVID-19 on 9/29.  Husband is symptomatic.  Patient started Paxlovid that evening.  She has no respiratory symptoms.  Subjective: Seen and examined earlier this morning.  No major events overnight of this morning.  Reports improvement in hip pain.  She rates her pain 3/10 but worse when she feels like going to the bathroom.  She has mucoid stool.  No blood in the stool.  Denies nausea or vomiting.  She denies UTI symptoms.  Husband at bedside.  Objective: Vitals:   07/05/22 2100 07/06/22 0215 07/06/22 0632 07/06/22 0945  BP: 123/78 106/69 117/68 (!) 108/90  Pulse: 92 78 80 78  Resp: 16 (!) 22 (!) 24 20  Temp:      TempSrc:      SpO2: 95% 95% 95% 94%  Weight:      Height:        Examination:  GENERAL: No apparent distress.  Nontoxic. HEENT: MMM.  Vision and hearing grossly intact.  NECK: Supple.  No apparent JVD.  RESP:  No IWOB.  Fair aeration bilaterally. CVS:  RRR. Heart sounds normal.  ABD/GI/GU: BS+. Abd soft.  Mild tenderness with palpation.  Rebound tenderness but mild. MSK/EXT:  Moves extremities.  Arthritic changes in her hands SKIN: no apparent skin lesion or wound NEURO: Awake, alert and oriented appropriately.  No apparent  focal neuro deficit. PSYCH: Calm. Normal affect.   Procedures:  None  Microbiology summarized: None  Assessment and plan: Principal Problem:   Pneumoperitoneum Active Problems:   Diverticulitis of colon with perforation   Essential hypertension   Tobacco abuse   Hyperlipidemia   COVID-19 virus infection   Hyponatremia  Acute sigmoid diverticulitis with perforation and trace pneumoperitoneum: Pain improved. -Surgery following-conservative care with IV antibiotics and bowel rest -Continue Cipro and Flagyl -N.p.o. except sips and ice chips -Needs colonoscopy once infection and inflammation heals.  COVID-19 infection: Tested positive on 9/29.  Started on Paxlovid.  Respiratory symptoms. -Continue home Paxlovid to complete 5 days course -Continue isolation precaution  Essential hypertension: Normotensive for most part. -Continue home Coreg -Avapro instead of home Diovan.  Hyponatremia: Likely due to dehydration.  Improving. -Continue monitoring  Hyperlipidemia: -Resume Zetia once diarrhea improves  Tobacco use disorder: Quit smoking about a week ago.   Body mass index is 21.21 kg/m.           DVT prophylaxis:  heparin injection 5,000 Units Start: 07/06/22 0630 SCDs Start: 07/06/22 8502  Code Status: Full code Family Communication: Updated patient's husband at bedside Level of care: Med-Surg Status is: Inpatient Remains inpatient appropriate because: Acute diverticulitis with perforation and pneumoperitoneum   Final disposition: Likely home once medically stable Consultants:  General surgery  Sch Meds:  Scheduled Meds:  carvedilol  3.125 mg Oral BID WC   [START  ON 07/07/2022] ezetimibe  10 mg Oral Daily   heparin  5,000 Units Subcutaneous Q8H   irbesartan  75 mg Oral Daily   molnupiravir EUA  4 capsule Oral BID   Continuous Infusions:  sodium chloride 75 mL/hr at 07/06/22 0638   ciprofloxacin Stopped (07/06/22 0756)   metronidazole 500 mg (07/06/22  0756)   PRN Meds:.acetaminophen **OR** acetaminophen, HYDROmorphone (DILAUDID) injection, oxyCODONE  Antimicrobials: Anti-infectives (From admission, onward)    Start     Dose/Rate Route Frequency Ordered Stop   07/06/22 1400  molnupiravir EUA (LAGEVRIO) capsule 800 mg       Note to Pharmacy: Pt may take home meds   4 capsule Oral 2 times daily 07/06/22 0624 07/07/22 0959   07/06/22 0800  metroNIDAZOLE (FLAGYL) IVPB 500 mg        500 mg 100 mL/hr over 60 Minutes Intravenous Every 12 hours 07/06/22 0434     07/06/22 0600  ciprofloxacin (CIPRO) IVPB 400 mg        400 mg 200 mL/hr over 60 Minutes Intravenous Every 12 hours 07/06/22 0434     07/05/22 2100  **Patient Supplied*Molnupiravir EUA (LAGEVRIO) capsule 800 mg        4 capsule Oral  Once 07/05/22 2046 07/05/22 2100   07/05/22 1800  ciprofloxacin (CIPRO) IVPB 400 mg        400 mg 200 mL/hr over 60 Minutes Intravenous  Once 07/05/22 1756 07/05/22 2033   07/05/22 1800  metroNIDAZOLE (FLAGYL) IVPB 500 mg        500 mg 100 mL/hr over 60 Minutes Intravenous  Once 07/05/22 1756 07/05/22 2104        I have personally reviewed the following labs and images: CBC: Recent Labs  Lab 07/05/22 1411 07/06/22 0625  WBC 10.3 10.7*  NEUTROABS  --  8.6*  HGB 13.9 12.1  HCT 40.3 35.5*  MCV 88.8 89.6  PLT 238 249   BMP &GFR Recent Labs  Lab 07/05/22 1411 07/06/22 0625  NA 129* 132*  K 4.2 3.8  CL 93* 102  CO2 24 21*  GLUCOSE 188* 125*  BUN 9 7*  CREATININE 0.47 0.32*  CALCIUM 9.6 8.8*  MG  --  2.0   Estimated Creatinine Clearance: 44.6 mL/min (A) (by C-G formula based on SCr of 0.32 mg/dL (L)). Liver & Pancreas: Recent Labs  Lab 07/05/22 1411 07/06/22 0625  AST 41 24  ALT 41 31  ALKPHOS 132* 121  BILITOT 0.9 1.2  PROT 7.0 6.2*  ALBUMIN 4.1 3.1*   Recent Labs  Lab 07/05/22 1411  LIPASE 17   No results for input(s): "AMMONIA" in the last 168 hours. Diabetic: No results for input(s): "HGBA1C" in the last 72  hours. No results for input(s): "GLUCAP" in the last 168 hours. Cardiac Enzymes: No results for input(s): "CKTOTAL", "CKMB", "CKMBINDEX", "TROPONINI" in the last 168 hours. No results for input(s): "PROBNP" in the last 8760 hours. Coagulation Profile: No results for input(s): "INR", "PROTIME" in the last 168 hours. Thyroid Function Tests: Recent Labs    07/06/22 0625  TSH 0.815   Lipid Profile: No results for input(s): "CHOL", "HDL", "LDLCALC", "TRIG", "CHOLHDL", "LDLDIRECT" in the last 72 hours. Anemia Panel: No results for input(s): "VITAMINB12", "FOLATE", "FERRITIN", "TIBC", "IRON", "RETICCTPCT" in the last 72 hours. Urine analysis:    Component Value Date/Time   COLORURINE YELLOW 07/05/2022 1901   APPEARANCEUR CLEAR 07/05/2022 1901   LABSPEC <1.005 (L) 07/05/2022 1901   PHURINE 7.5 07/05/2022 1901  GLUCOSEU NEGATIVE 07/05/2022 1901   HGBUR TRACE (A) 07/05/2022 1901   BILIRUBINUR NEGATIVE 07/05/2022 1901   KETONESUR 40 (A) 07/05/2022 1901   PROTEINUR 30 (A) 07/05/2022 1901   UROBILINOGEN 0.2 05/21/2009 1437   NITRITE NEGATIVE 07/05/2022 1901   LEUKOCYTESUR NEGATIVE 07/05/2022 1901   Sepsis Labs: Invalid input(s): "PROCALCITONIN", "LACTICIDVEN"  Microbiology: No results found for this or any previous visit (from the past 240 hour(s)).  Radiology Studies: CT Abdomen Pelvis W Contrast  Addendum Date: 07/05/2022   ADDENDUM REPORT: 07/05/2022 17:46 ADDENDUM: These results were called by telephone at the time of interpretation on 07/05/2022 at 5:45 pm to provider Dr. Amador Cunas, who verbally acknowledged these results. Electronically Signed   By: Iven Finn M.D.   On: 07/05/2022 17:46   Result Date: 07/05/2022 CLINICAL DATA:  Abdominal pain, acute, nonlocalized. Pt to ED known COVID positive, tested positive on Friday, taking Lagevrio as prescribed. Here today for increased weakness, abdominal pain, diarrhea. EXAM: CT ABDOMEN AND PELVIS WITH CONTRAST TECHNIQUE:  Multidetector CT imaging of the abdomen and pelvis was performed using the standard protocol following bolus administration of intravenous contrast. RADIATION DOSE REDUCTION: This exam was performed according to the departmental dose-optimization program which includes automated exposure control, adjustment of the mA and/or kV according to patient size and/or use of iterative reconstruction technique. CONTRAST:  40mL OMNIPAQUE IOHEXOL 300 MG/ML  SOLN COMPARISON:  CT chest 05/06/2022, CT hip 06/21/2013 FINDINGS: Lower chest: No acute abnormality.  Coronary artery calcification. Hepatobiliary: No focal liver abnormality. No gallstones, gallbladder wall thickening, or pericholecystic fluid. No biliary dilatation. Pancreas: No focal lesion. Normal pancreatic contour. No surrounding inflammatory changes. No main pancreatic ductal dilatation. Spleen: Normal in size without focal abnormality. Adrenals/Urinary Tract: No adrenal nodule bilaterally. Bilateral kidneys enhance symmetrically. Subcentimeter hypodensities are too small to characterize. No hydronephrosis. No hydroureter. The urinary bladder is unremarkable. Stomach/Bowel: Stomach is within normal limits. No evidence of small bowel wall thickening or dilatation. Colonic diverticulosis. Bowel wall thickening and pericolonic fat stranding of the mid sigmoid colon. Marked inflammatory changes surrounding the mid sigmoid colon within the pelvis. No definite intramural abscess formation. Appendix appears normal. Vascular/Lymphatic: No abdominal aorta or iliac aneurysm. Mild atherosclerotic plaque of the aorta and its branches. No abdominal, pelvic, or inguinal lymphadenopathy. Reproductive: Status post hysterectomy. No adnexal masses. Other: No intraperitoneal free fluid. Trace volume free intraperitoneal gas. No organized fluid collection. Musculoskeletal: No abdominal wall hernia or abnormality. No suspicious lytic or blastic osseous lesions. No acute displaced  fracture. Levoscoliosis centered at the L2 with associated multilevel degenerative changes of the spine. Bilateral total hip arthroplasty. 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 IMPRESSION: 1. Colonic diverticulosis with perforated acute sigmoid diverticulitis. Trace volume pneumoperitoneum. Recommend colonoscopy status post treatment and status post complete resolution of inflammatory changes to exclude an underlying lesion. 2. Aortic Atherosclerosis (ICD10-I70.0) including coronary artery calcification. Electronically Signed: By: Iven Finn M.D. On: 07/05/2022 17:42      Enjoli Tidd T. [place]  If 7PM-7AM, please contact night-coverage [email or web address] 07/06/2022, 1:34 PM

## 2022-07-06 NOTE — Subjective & Objective (Signed)
CC: abd pain HPI: 71 year old female history arthritis, hypertension, hyperlipidemia presents to the ER today with abdominal pain for 1-1/2 days.  She started feeling sick along with her husband on Thursday evening.  He took a home COVID test on Friday which was positive.  There was started on molnupiravir.  Patient's been having diarrhea.  Husband has been asymptomatic.  After her diarrhea started, she noticed yellow stools with a lot of mucus.  She started having increasing pain.  Diarrhea was before she started treatment for COVID.  She came to the ER today for evaluation.  On arrival temp 97.8 heart rate 70 blood pressure 130/94 satting 100% on room air.  Labs showed a white count of 10.3, hemoglobin 13.9, platelets of 238  Sodium 129, potassium 4.2, bicarb of 24, BUN of 9, creatinine 0.47  CT scan abdomen pelvis demonstrated colonic diverticulum with perforated acute sigmoid diverticulitis.  There was trace pneumoperitoneum.  Since the patient was at the stand-alone ER [facility], patient was transferred directly to the [facility], ER.  General surgery seen the patient.  They recommended nonoperative management.  [facility] hospitalist contacted for admission.

## 2022-07-06 NOTE — Assessment & Plan Note (Signed)
Continue with NPO status. IVF. IV abx with cipro/flagyl. Prn iv dilaudid and prn oxycodone for pain.

## 2022-07-06 NOTE — Assessment & Plan Note (Signed)
Stable. Continue coreg and diovan

## 2022-07-06 NOTE — H&P (Addendum)
History and Physical    Melissa Waters CWC:[id number] DOB: 1951/07/09 DOA: 07/05/2022  DOS: the patient was seen and examined on 07/05/2022  PCP: Daisy Floro, MD   Patient coming from: Home  I have personally briefly reviewed patient's old medical records in [facility]  CC: abd pain HPI: 71 year old female history arthritis, hypertension, hyperlipidemia presents to the ER today with abdominal pain for 1-1/2 days.  She started feeling sick along with her husband on Thursday evening.  He took a home COVID test on Friday which was positive.  There was started on molnupiravir.  Patient's been having diarrhea.  Husband has been asymptomatic.  After her diarrhea started, she noticed yellow stools with a lot of mucus.  She started having increasing pain.  Diarrhea was before she started treatment for COVID.  She came to the ER today for evaluation.  On arrival temp 97.8 heart rate 70 blood pressure 130/94 satting 100% on room air.  Labs showed a white count of 10.3, hemoglobin 13.9, platelets of 238  Sodium 129, potassium 4.2, bicarb of 24, BUN of 9, creatinine 0.47  CT scan abdomen pelvis demonstrated colonic diverticulum with perforated acute sigmoid diverticulitis.  There was trace pneumoperitoneum.  Since the patient was at the stand-alone ER [facility], patient was transferred directly to the [facility], ER.  General surgery seen the patient.  They recommended nonoperative management.  [facility] hospitalist contacted for admission.   ED Course: CT abd/pelvis shows sigmoid diverticulitis with perforation and pneumoperitoneum.  Review of Systems:  Review of Systems  Constitutional:  Positive for malaise/fatigue.  HENT:  Positive for congestion and sore throat.   Eyes: Negative.   Respiratory: Negative.    Cardiovascular: Negative.   Gastrointestinal:  Positive for abdominal pain and diarrhea. Negative for blood in stool and melena.  Genitourinary: Negative.    Musculoskeletal: Negative.   Skin: Negative.   Neurological: Negative.   Endo/Heme/Allergies: Negative.   Psychiatric/Behavioral: Negative.    All other systems reviewed and are negative.   Past Medical History:  Diagnosis Date   COPD (chronic obstructive pulmonary disease) (HCC)    Diabetes mellitus without complication (HCC)    no meds    Diverticulosis    Hx: of   Heart murmur    as a baby   Hypertension    Nephritis    Hx: of 1958   Osteoarthritis    right hip   Osteoarthritis of left hip 08/13/2013    Past Surgical History:  Procedure Laterality Date   COLONOSCOPY W/ POLYPECTOMY  2006   TOTAL HIP ARTHROPLASTY Right    05/27/2009   TOTAL HIP ARTHROPLASTY Left 08/13/2013   Procedure: TOTAL HIP ARTHROPLASTY;  Surgeon: Eulas Post, MD;  Location: [facility] OR;  Service: Orthopedics;  Laterality: Left;     reports that she has been smoking cigarettes. She has a 61.25 pack-year smoking history. She has never used smokeless tobacco. She reports current alcohol use. She reports that she does not use drugs.  Allergies  Allergen Reactions   Amoxil [Amoxicillin] Diarrhea and Nausea And Vomiting   Metformin Other (See Comments)   Pravastatin     Leg pain   Rosuvastatin     Memory problems    Family History  Problem Relation Age of Onset   Heart Problems Father 42       CABG   Hypertension Mother    Breast cancer Mother    Heart attack Other 61  fatal MI    Prior to Admission medications   Medication Sig Start Date End Date Taking? Authorizing Provider  aspirin EC 81 MG tablet Take 1 tablet (81 mg total) by mouth daily. 11/23/13   Lars Masson, MD  BIOTIN PO Take 1 tablet by mouth daily.    [provider]  Carboxymethylcellul-Glycerin (CLEAR EYES FOR DRY EYES OP) Place 1 drop into both eyes 2 (two) times daily. Clear Eyes for Dry Eyes    [provider]  carvedilol (COREG) 3.125 MG tablet Take 1 tablet (3.125 mg total) by mouth 2 (two)  times daily with a meal. 04/29/22   Meriam Sprague, MD  Cholecalciferol (VITAMIN D3) 5000 units CAPS Take 1 capsule by mouth daily.    [provider]  ezetimibe (ZETIA) 10 MG tablet Take 1 tablet (10 mg total) by mouth daily. 07/13/21   Meriam Sprague, MD  isosorbide mononitrate (IMDUR) 30 MG 24 hr tablet Take 1 tablet (30 mg total) by mouth daily. 07/13/21   Meriam Sprague, MD  LAGEVRIO 200 MG CAPS capsule SMARTSIG:4 Capsule(s) By Mouth Every 12 Hours 07/02/22   [provider]  meloxicam (MOBIC) 15 MG tablet Take 15 mg by mouth daily.    [provider]  metoprolol tartrate (LOPRESSOR) 100 MG tablet Take 1 tablet (100 mg total) by mouth once for 1 dose. Take 90-120 minutes prior to scan. 04/19/22 04/19/22  Meriam Sprague, MD  MILK THISTLE PO Take 350 mg by mouth daily.    [provider]  nitroGLYCERIN (NITROSTAT) 0.4 MG SL tablet Place 1 tablet (0.4 mg total) under the tongue every 5 (five) minutes as needed for chest pain. 12/05/20   Meriam Sprague, MD  Probiotic Product (PHILLIPS COLON HEALTH PO) Take 1 capsule by mouth daily.    [provider]  valsartan (DIOVAN) 160 MG tablet Take 160 mg by mouth daily.    [provider]  vitamin C (ASCORBIC ACID) 500 MG tablet Take 500 mg by mouth daily.    [provider]    Physical Exam: Vitals:   07/05/22 2000 07/05/22 2030 07/05/22 2044 07/05/22 2100  BP: 121/73 119/73  123/78  Pulse: 95 89  92  Resp:  16  16  Temp:   100 F (37.8 C)   TempSrc:   Oral   SpO2: 95% 98%  95%  Weight:      Height:        Physical Exam Vitals and nursing note reviewed.  Constitutional:      General: She is not in acute distress.    Appearance: Normal appearance. She is not ill-appearing, toxic-appearing or diaphoretic.     Comments: Thin female  HENT:     Head: Normocephalic and atraumatic.     Nose: Nose normal.  Eyes:     General: No scleral  icterus. Cardiovascular:     Rate and Rhythm: Normal rate and regular rhythm.     Pulses: Normal pulses.  Pulmonary:     Effort: Pulmonary effort is normal. No respiratory distress.     Breath sounds: Normal breath sounds.  Abdominal:     General: Abdomen is flat. There is no distension.     Tenderness: There is abdominal tenderness in the suprapubic area and left lower quadrant. There is no guarding or rebound.  Musculoskeletal:     Right lower leg: No edema.     Left lower leg: No edema.     Comments: Arthritis  of hands  Skin:    General: Skin is warm and dry.     Capillary Refill: Capillary refill takes less than 2 seconds.  Neurological:     General: No focal deficit present.     Mental Status: She is alert and oriented to person, place, and time.      Labs on Admission: I have personally reviewed following labs and imaging studies  CBC: Recent Labs  Lab 07/05/22 1411  WBC 10.3  HGB 13.9  HCT 40.3  MCV 88.8  PLT 408   Basic Metabolic Panel: Recent Labs  Lab 07/05/22 1411  NA 129*  K 4.2  CL 93*  CO2 24  GLUCOSE 188*  BUN 9  CREATININE 0.47  CALCIUM 9.6   GFR: Estimated Creatinine Clearance: 44.6 mL/min (by C-G formula based on SCr of 0.47 mg/dL). Liver Function Tests: Recent Labs  Lab 07/05/22 1411  AST 41  ALT 41  ALKPHOS 132*  BILITOT 0.9  PROT 7.0  ALBUMIN 4.1   Recent Labs  Lab 07/05/22 1411  LIPASE 17   No results for input(s): "AMMONIA" in the last 168 hours. Coagulation Profile: No results for input(s): "INR", "PROTIME" in the last 168 hours. Cardiac Enzymes: No results for input(s): "CKTOTAL", "CKMB", "CKMBINDEX", "TROPONINI", "TROPONINIHS" in the last 168 hours. BNP (last 3 results) No results for input(s): "PROBNP" in the last 8760 hours. HbA1C: No results for input(s): "HGBA1C" in the last 72 hours. CBG: No results for input(s): "GLUCAP" in the last 168 hours. Lipid Profile: No results for input(s): "CHOL", "HDL", "LDLCALC",  "TRIG", "CHOLHDL", "LDLDIRECT" in the last 72 hours. Thyroid Function Tests: No results for input(s): "TSH", "T4TOTAL", "FREET4", "T3FREE", "THYROIDAB" in the last 72 hours. Anemia Panel: No results for input(s): "VITAMINB12", "FOLATE", "FERRITIN", "TIBC", "IRON", "RETICCTPCT" in the last 72 hours. Urine analysis:    Component Value Date/Time   COLORURINE YELLOW 07/05/2022 1901   APPEARANCEUR CLEAR 07/05/2022 1901   LABSPEC <1.005 (L) 07/05/2022 1901   PHURINE 7.5 07/05/2022 1901   GLUCOSEU NEGATIVE 07/05/2022 1901   HGBUR TRACE (A) 07/05/2022 1901   BILIRUBINUR NEGATIVE 07/05/2022 1901   KETONESUR 40 (A) 07/05/2022 1901   PROTEINUR 30 (A) 07/05/2022 1901   UROBILINOGEN 0.2 05/21/2009 1437   NITRITE NEGATIVE 07/05/2022 1901   LEUKOCYTESUR NEGATIVE 07/05/2022 1901    Radiological Exams on Admission: I have personally reviewed images CT Abdomen Pelvis W Contrast  Addendum Date: 07/05/2022   ADDENDUM REPORT: 07/05/2022 17:46 ADDENDUM: These results were called by telephone at the time of interpretation on 07/05/2022 at 5:45 pm to provider Dr. Amador Cunas, who verbally acknowledged these results. Electronically Signed   By: Iven Finn M.D.   On: 07/05/2022 17:46   Result Date: 07/05/2022 CLINICAL DATA:  Abdominal pain, acute, nonlocalized. Pt to ED known COVID positive, tested positive on Friday, taking Lagevrio as prescribed. Here today for increased weakness, abdominal pain, diarrhea. EXAM: CT ABDOMEN AND PELVIS WITH CONTRAST TECHNIQUE: Multidetector CT imaging of the abdomen and pelvis was performed using the standard protocol following bolus administration of intravenous contrast. RADIATION DOSE REDUCTION: This exam was performed according to the departmental dose-optimization program which includes automated exposure control, adjustment of the mA and/or kV according to patient size and/or use of iterative reconstruction technique. CONTRAST:  88mL OMNIPAQUE IOHEXOL 300 MG/ML  SOLN  COMPARISON:  CT chest 05/06/2022, CT hip 06/21/2013 FINDINGS: Lower chest: No acute abnormality.  Coronary artery calcification. Hepatobiliary: No focal liver abnormality. No gallstones, gallbladder wall thickening, or pericholecystic fluid.  No biliary dilatation. Pancreas: No focal lesion. Normal pancreatic contour. No surrounding inflammatory changes. No main pancreatic ductal dilatation. Spleen: Normal in size without focal abnormality. Adrenals/Urinary Tract: No adrenal nodule bilaterally. Bilateral kidneys enhance symmetrically. Subcentimeter hypodensities are too small to characterize. No hydronephrosis. No hydroureter. The urinary bladder is unremarkable. Stomach/Bowel: Stomach is within normal limits. No evidence of small bowel wall thickening or dilatation. Colonic diverticulosis. Bowel wall thickening and pericolonic fat stranding of the mid sigmoid colon. Marked inflammatory changes surrounding the mid sigmoid colon within the pelvis. No definite intramural abscess formation. Appendix appears normal. Vascular/Lymphatic: No abdominal aorta or iliac aneurysm. Mild atherosclerotic plaque of the aorta and its branches. No abdominal, pelvic, or inguinal lymphadenopathy. Reproductive: Status post hysterectomy. No adnexal masses. Other: No intraperitoneal free fluid. Trace volume free intraperitoneal gas. No organized fluid collection. Musculoskeletal: No abdominal wall hernia or abnormality. No suspicious lytic or blastic osseous lesions. No acute displaced fracture. Levoscoliosis centered at the L2 with associated multilevel degenerative changes of the spine. Bilateral total hip arthroplasty. 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 IMPRESSION: 1. Colonic diverticulosis with perforated acute sigmoid diverticulitis. Trace volume pneumoperitoneum. Recommend colonoscopy status post treatment and status post complete resolution of inflammatory changes to exclude an underlying lesion. 2. Aortic Atherosclerosis  (ICD10-I70.0) including coronary artery calcification. Electronically Signed: By: Tish Frederickson M.D. On: 07/05/2022 17:42    EKG: My personal interpretation of EKG shows: NSR, prolonged QTC    Assessment/Plan Principal Problem:   Pneumoperitoneum Active Problems:   Diverticulitis of colon with perforation   Essential hypertension   Tobacco abuse   Hyperlipidemia   COVID-19 virus infection   Hyponatremia    Assessment and Plan: * Pneumoperitoneum Admit to progressive bed. General surgery has seen patient and will follow pt as consultant. Per their note, no immediate need for surgery at this time.  Diverticulitis of colon with perforation Continue with NPO status. IVF. IV abx with cipro/flagyl. Prn iv dilaudid and prn oxycodone for pain.  COVID-19 virus infection dx'd last Friday. Pt has been taking molnupiravir. Pt may take home meds. Not hypoxic. No respiratory system. Most of her covid symptoms are GI/diarrhea.  Hyperlipidemia Continue zetia 10 mg daily. Has statin myopathy.  Tobacco abuse Stable. Pt states she has quit for about 1 weeks. Declines nicotine patch.  Essential hypertension Stable. Continue coreg and diovan  Hyponatremia Check TSH. Likely due to diarrhea. Continue with IV NS. Repeat BMP.   DVT prophylaxis: SQ Heparin Code Status: Full Code Family Communication: no family at bedside  Disposition Plan: return home  Consults called: Dr. Carolynne Edouard with general surgery  Admission status: Inpatient,  progressive   Carollee Herter, DO [facility] 07/06/2022, 1:42 AM

## 2022-07-06 NOTE — Assessment & Plan Note (Signed)
Continue zetia 10 mg daily. Has statin myopathy.

## 2022-07-06 NOTE — Assessment & Plan Note (Addendum)
dx'd last Friday. Pt has been taking molnupiravir. Pt may take home meds. Not hypoxic. No respiratory system. Most of her covid symptoms are GI/diarrhea.

## 2022-07-06 NOTE — Progress Notes (Signed)
[facility] Progress Note     Subjective: CC-  Continues to have some lower abdominal pain but it is improving. Denies n/v. Passing flatus and has had several loose stools. WBC 10.7, TMAX 100  Objective: Vital signs in last 24 hours: Temp:  [97.8 F (36.6 C)-100 F (37.8 C)] 100 F (37.8 C) (10/02 2044) Pulse Rate:  [73-95] 78 (10/03 0945) Resp:  [16-24] 20 (10/03 0945) BP: (106-133)/(68-94) 108/90 (10/03 0945) SpO2:  [94 %-100 %] 94 % (10/03 0945) Weight:  [47.6 kg] 47.6 kg (10/02 1407)    Intake/Output from previous day: 10/02 0701 - 10/03 0700 In: 1300.9 [IV Piggyback:1300.9] Out: -  Intake/Output this shift: Total I/O In: 206.8 [IV Piggyback:206.8] Out: -   PE: Gen:  Alert, NAD, pleasant Cardio: RRR Pulm: rate and effort normal on room air Abd: soft, ND, minimal suprapubic TTP without rebound or guarding  Lab Results:  Recent Labs    07/05/22 1411 07/06/22 0625  WBC 10.3 10.7*  HGB 13.9 12.1  HCT 40.3 35.5*  PLT 238 249   BMET Recent Labs    07/05/22 1411 07/06/22 0625  NA 129* 132*  K 4.2 3.8  CL 93* 102  CO2 24 21*  GLUCOSE 188* 125*  BUN 9 7*  CREATININE 0.47 0.32*  CALCIUM 9.6 8.8*   PT/INR No results for input(s): "LABPROT", "INR" in the last 72 hours. CMP     Component Value Date/Time   NA 132 (L) 07/06/2022 0625   K 3.8 07/06/2022 0625   CL 102 07/06/2022 0625   CO2 21 (L) 07/06/2022 0625   GLUCOSE 125 (H) 07/06/2022 0625   BUN 7 (L) 07/06/2022 0625   CREATININE 0.32 (L) 07/06/2022 0625   CALCIUM 8.8 (L) 07/06/2022 0625   PROT 6.2 (L) 07/06/2022 0625   PROT 6.2 02/20/2020 0829   ALBUMIN 3.1 (L) 07/06/2022 0625   ALBUMIN 4.4 02/20/2020 0829   AST 24 07/06/2022 0625   ALT 31 07/06/2022 0625   ALKPHOS 121 07/06/2022 0625   BILITOT 1.2 07/06/2022 0625   BILITOT 0.3 02/20/2020 0829   GFRNONAA >60 07/06/2022 0625   GFRAA >90 08/15/2013 0412   Lipase     Component Value Date/Time   LIPASE 17 07/05/2022 1411        Studies/Results: CT Abdomen Pelvis W Contrast  Addendum Date: 07/05/2022   ADDENDUM REPORT: 07/05/2022 17:46 ADDENDUM: These results were called by telephone at the time of interpretation on 07/05/2022 at 5:45 pm to provider Dr. David Stall, who verbally acknowledged these results. Electronically Signed   By: Tish Frederickson M.D.   On: 07/05/2022 17:46   Result Date: 07/05/2022 CLINICAL DATA:  Abdominal pain, acute, nonlocalized. Pt to ED known COVID positive, tested positive on Friday, taking Lagevrio as prescribed. Here today for increased weakness, abdominal pain, diarrhea. EXAM: CT ABDOMEN AND PELVIS WITH CONTRAST TECHNIQUE: Multidetector CT imaging of the abdomen and pelvis was performed using the standard protocol following bolus administration of intravenous contrast. RADIATION DOSE REDUCTION: This exam was performed according to the departmental dose-optimization program which includes automated exposure control, adjustment of the mA and/or kV according to patient size and/or use of iterative reconstruction technique. CONTRAST:  65mL OMNIPAQUE IOHEXOL 300 MG/ML  SOLN COMPARISON:  CT chest 05/06/2022, CT hip 06/21/2013 FINDINGS: Lower chest: No acute abnormality.  Coronary artery calcification. Hepatobiliary: No focal liver abnormality. No gallstones, gallbladder wall thickening, or pericholecystic fluid. No biliary dilatation. Pancreas: No focal lesion. Normal pancreatic contour. No surrounding inflammatory changes. No  main pancreatic ductal dilatation. Spleen: Normal in size without focal abnormality. Adrenals/Urinary Tract: No adrenal nodule bilaterally. Bilateral kidneys enhance symmetrically. Subcentimeter hypodensities are too small to characterize. No hydronephrosis. No hydroureter. The urinary bladder is unremarkable. Stomach/Bowel: Stomach is within normal limits. No evidence of small bowel wall thickening or dilatation. Colonic diverticulosis. Bowel wall thickening and pericolonic fat  stranding of the mid sigmoid colon. Marked inflammatory changes surrounding the mid sigmoid colon within the pelvis. No definite intramural abscess formation. Appendix appears normal. Vascular/Lymphatic: No abdominal aorta or iliac aneurysm. Mild atherosclerotic plaque of the aorta and its branches. No abdominal, pelvic, or inguinal lymphadenopathy. Reproductive: Status post hysterectomy. No adnexal masses. Other: No intraperitoneal free fluid. Trace volume free intraperitoneal gas. No organized fluid collection. Musculoskeletal: No abdominal wall hernia or abnormality. No suspicious lytic or blastic osseous lesions. No acute displaced fracture. Levoscoliosis centered at the L2 with associated multilevel degenerative changes of the spine. Bilateral total hip arthroplasty. 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 IMPRESSION: 1. Colonic diverticulosis with perforated acute sigmoid diverticulitis. Trace volume pneumoperitoneum. Recommend colonoscopy status post treatment and status post complete resolution of inflammatory changes to exclude an underlying lesion. 2. Aortic Atherosclerosis (ICD10-I70.0) including coronary artery calcification. Electronically Signed: By: Iven Finn M.D. On: 07/05/2022 17:42    Anti-infectives: Anti-infectives (From admission, onward)    Start     Dose/Rate Route Frequency Ordered Stop   07/06/22 1000  molnupiravir EUA (LAGEVRIO) capsule 800 mg       Note to Pharmacy: Pt may take home meds   4 capsule Oral 2 times daily 07/06/22 0624 07/11/22 0959   07/06/22 0800  metroNIDAZOLE (FLAGYL) IVPB 500 mg        500 mg 100 mL/hr over 60 Minutes Intravenous Every 12 hours 07/06/22 0434     07/06/22 0600  ciprofloxacin (CIPRO) IVPB 400 mg        400 mg 200 mL/hr over 60 Minutes Intravenous Every 12 hours 07/06/22 0434     07/05/22 2100  **Patient Supplied*Molnupiravir EUA (LAGEVRIO) capsule 800 mg        4 capsule Oral  Once 07/05/22 2046 07/05/22 2100   07/05/22 1800   ciprofloxacin (CIPRO) IVPB 400 mg        400 mg 200 mL/hr over 60 Minutes Intravenous  Once 07/05/22 1756 07/05/22 2033   07/05/22 1800  metroNIDAZOLE (FLAGYL) IVPB 500 mg        500 mg 100 mL/hr over 60 Minutes Intravenous  Once 07/05/22 1756 07/05/22 2104        Assessment/Plan Acute sigmoid diverticulitis with microperforation - 1st bout, unsure when her last colonoscopy was but thinks it was >5 years ago - CT 10/2 shows colonic diverticulosis with perforated acute sigmoid diverticulitis and trace volume pneumoperitoneum - Patient is very nontoxic appearing. Minimally tender on abdominal exam. WBC 10.7, VSS. No indication for acute surgical intervention. Continue bowel rest and IV antibiotics today.  ID - cipro/flagyl FEN - IVF, NPO except sips/chips VTE - sq heparin Foley - none  COVID-19 infection HTN HLD Tobacco abuse  I reviewed hospitalist notes, last 24 h vitals and pain scores, last 48 h intake and output, last 24 h labs and trends, and last 24 h imaging results.    LOS: 1 day    Wellington Hampshire, [facility] 07/06/2022, 10:56 AM Please see Amion for pager number during day hours 7:00am-4:30pm

## 2022-07-07 ENCOUNTER — Encounter (HOSPITAL_COMMUNITY): Payer: Self-pay | Admitting: Student

## 2022-07-07 DIAGNOSIS — K5720 Diverticulitis of large intestine with perforation and abscess without bleeding: Secondary | ICD-10-CM | POA: Diagnosis not present

## 2022-07-07 DIAGNOSIS — K668 Other specified disorders of peritoneum: Secondary | ICD-10-CM | POA: Diagnosis not present

## 2022-07-07 DIAGNOSIS — U071 COVID-19: Secondary | ICD-10-CM | POA: Diagnosis not present

## 2022-07-07 DIAGNOSIS — I10 Essential (primary) hypertension: Secondary | ICD-10-CM | POA: Diagnosis not present

## 2022-07-07 LAB — RENAL FUNCTION PANEL
Albumin: 2.8 g/dL — ABNORMAL LOW (ref 3.5–5.0)
Anion gap: 9 (ref 5–15)
BUN: 5 mg/dL — ABNORMAL LOW (ref 8–23)
CO2: 20 mmol/L — ABNORMAL LOW (ref 22–32)
Calcium: 8.4 mg/dL — ABNORMAL LOW (ref 8.9–10.3)
Chloride: 107 mmol/L (ref 98–111)
Creatinine, Ser: <0.3 mg/dL — ABNORMAL LOW (ref 0.44–1.00)
Glucose, Bld: 91 mg/dL (ref 70–99)
Phosphorus: 2.2 mg/dL — ABNORMAL LOW (ref 2.5–4.6)
Potassium: 3.4 mmol/L — ABNORMAL LOW (ref 3.5–5.1)
Sodium: 136 mmol/L (ref 135–145)

## 2022-07-07 LAB — CBC
HCT: 35.4 % — ABNORMAL LOW (ref 36.0–46.0)
Hemoglobin: 11.9 g/dL — ABNORMAL LOW (ref 12.0–15.0)
MCH: 31.1 pg (ref 26.0–34.0)
MCHC: 33.6 g/dL (ref 30.0–36.0)
MCV: 92.4 fL (ref 80.0–100.0)
Platelets: 294 10*3/uL (ref 150–400)
RBC: 3.83 MIL/uL — ABNORMAL LOW (ref 3.87–5.11)
RDW: 12.7 % (ref 11.5–15.5)
WBC: 10.2 10*3/uL (ref 4.0–10.5)
nRBC: 0 % (ref 0.0–0.2)

## 2022-07-07 LAB — MAGNESIUM: Magnesium: 2 mg/dL (ref 1.7–2.4)

## 2022-07-07 LAB — HIV ANTIBODY (ROUTINE TESTING W REFLEX): HIV Screen 4th Generation wRfx: NONREACTIVE

## 2022-07-07 MED ORDER — POTASSIUM CHLORIDE CRYS ER 20 MEQ PO TBCR
40.0000 meq | EXTENDED_RELEASE_TABLET | ORAL | Status: AC
  Administered 2022-07-07 (×2): 40 meq via ORAL
  Filled 2022-07-07 (×2): qty 2

## 2022-07-07 MED ORDER — MELATONIN 3 MG PO TABS
3.0000 mg | ORAL_TABLET | Freq: Once | ORAL | Status: AC
  Administered 2022-07-07: 3 mg via ORAL
  Filled 2022-07-07: qty 1

## 2022-07-07 NOTE — TOC Progression Note (Signed)
Transition of Care [facility]) - Progression Note    Patient Details  Name: Melissa Waters MRN: [id number] Date of Birth: 1951/07/29  Transition of Care [facility]) CM/SW Contact  Leeroy Cha, RN Phone Number: 07/07/2022, 7:46 AM  Clinical Narrative:    Smoking cessation information added to dc instructions.   Expected Discharge Plan: Home/Self Care Barriers to Discharge: Continued Medical Work up  Expected Discharge Plan and Services Expected Discharge Plan: Home/Self Care   Discharge Planning Services: CM Consult   Living arrangements for the past 2 months: Single Family Home                                       Social Determinants of Health (SDOH) Interventions    Readmission Risk Interventions   No data to display

## 2022-07-07 NOTE — Plan of Care (Signed)

## 2022-07-07 NOTE — Progress Notes (Signed)
[facility] Progress Note     Subjective: CC-  Feeling better, reports some bowel movements  Objective: Vital signs in last 24 hours: Temp:  [97.8 F (36.6 C)-98.7 F (37.1 C)] 97.8 F (36.6 C) (10/04 0527) Pulse Rate:  [70-80] 70 (10/04 0527) Resp:  [14-20] 18 (10/04 0527) BP: (108-157)/(80-95) 131/95 (10/04 0527) SpO2:  [94 %-100 %] 97 % (10/04 0527) Last BM Date : 07/07/22  Intake/Output from previous day: 10/03 0701 - 10/04 0700 In: 2274.9 [P.O.:290; I.V.:1254.4; IV Piggyback:730.4] Out: -  Intake/Output this shift: No intake/output data recorded.  PE: Gen:  Alert, NAD, pleasant Cardio: RRR Pulm: rate and effort normal on room air Abd: soft, ND, nontender  Lab Results:  Recent Labs    07/06/22 0625 07/07/22 0451  WBC 10.7* 10.2  HGB 12.1 11.9*  HCT 35.5* 35.4*  PLT 249 294    BMET Recent Labs    07/06/22 0625 07/07/22 0451  NA 132* 136  K 3.8 3.4*  CL 102 107  CO2 21* 20*  GLUCOSE 125* 91  BUN 7* 5*  CREATININE 0.32* <0.30*  CALCIUM 8.8* 8.4*    PT/INR No results for input(s): "LABPROT", "INR" in the last 72 hours. CMP     Component Value Date/Time   NA 136 07/07/2022 0451   K 3.4 (L) 07/07/2022 0451   CL 107 07/07/2022 0451   CO2 20 (L) 07/07/2022 0451   GLUCOSE 91 07/07/2022 0451   BUN 5 (L) 07/07/2022 0451   CREATININE <0.30 (L) 07/07/2022 0451   CALCIUM 8.4 (L) 07/07/2022 0451   PROT 6.2 (L) 07/06/2022 0625   PROT 6.2 02/20/2020 0829   ALBUMIN 2.8 (L) 07/07/2022 0451   ALBUMIN 4.4 02/20/2020 0829   AST 24 07/06/2022 0625   ALT 31 07/06/2022 0625   ALKPHOS 121 07/06/2022 0625   BILITOT 1.2 07/06/2022 0625   BILITOT 0.3 02/20/2020 0829   GFRNONAA NOT CALCULATED 07/07/2022 0451   GFRAA >90 08/15/2013 0412   Lipase     Component Value Date/Time   LIPASE 17 07/05/2022 1411       Studies/Results: CT Abdomen Pelvis W Contrast  Addendum Date: 07/05/2022   ADDENDUM REPORT: 07/05/2022 17:46 ADDENDUM: These  results were called by telephone at the time of interpretation on 07/05/2022 at 5:45 pm to provider Dr. David Stall, who verbally acknowledged these results. Electronically Signed   By: Tish Frederickson M.D.   On: 07/05/2022 17:46   Result Date: 07/05/2022 CLINICAL DATA:  Abdominal pain, acute, nonlocalized. Pt to ED known COVID positive, tested positive on Friday, taking Lagevrio as prescribed. Here today for increased weakness, abdominal pain, diarrhea. EXAM: CT ABDOMEN AND PELVIS WITH CONTRAST TECHNIQUE: Multidetector CT imaging of the abdomen and pelvis was performed using the standard protocol following bolus administration of intravenous contrast. RADIATION DOSE REDUCTION: This exam was performed according to the departmental dose-optimization program which includes automated exposure control, adjustment of the mA and/or kV according to patient size and/or use of iterative reconstruction technique. CONTRAST:  26mL OMNIPAQUE IOHEXOL 300 MG/ML  SOLN COMPARISON:  CT chest 05/06/2022, CT hip 06/21/2013 FINDINGS: Lower chest: No acute abnormality.  Coronary artery calcification. Hepatobiliary: No focal liver abnormality. No gallstones, gallbladder wall thickening, or pericholecystic fluid. No biliary dilatation. Pancreas: No focal lesion. Normal pancreatic contour. No surrounding inflammatory changes. No main pancreatic ductal dilatation. Spleen: Normal in size without focal abnormality. Adrenals/Urinary Tract: No adrenal nodule bilaterally. Bilateral kidneys enhance symmetrically. Subcentimeter hypodensities are too small to characterize. No hydronephrosis. No  hydroureter. The urinary bladder is unremarkable. Stomach/Bowel: Stomach is within normal limits. No evidence of small bowel wall thickening or dilatation. Colonic diverticulosis. Bowel wall thickening and pericolonic fat stranding of the mid sigmoid colon. Marked inflammatory changes surrounding the mid sigmoid colon within the pelvis. No definite intramural  abscess formation. Appendix appears normal. Vascular/Lymphatic: No abdominal aorta or iliac aneurysm. Mild atherosclerotic plaque of the aorta and its branches. No abdominal, pelvic, or inguinal lymphadenopathy. Reproductive: Status post hysterectomy. No adnexal masses. Other: No intraperitoneal free fluid. Trace volume free intraperitoneal gas. No organized fluid collection. Musculoskeletal: No abdominal wall hernia or abnormality. No suspicious lytic or blastic osseous lesions. No acute displaced fracture. Levoscoliosis centered at the L2 with associated multilevel degenerative changes of the spine. Bilateral total hip arthroplasty. 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 IMPRESSION: 1. Colonic diverticulosis with perforated acute sigmoid diverticulitis. Trace volume pneumoperitoneum. Recommend colonoscopy status post treatment and status post complete resolution of inflammatory changes to exclude an underlying lesion. 2. Aortic Atherosclerosis (ICD10-I70.0) including coronary artery calcification. Electronically Signed: By: Iven Finn M.D. On: 07/05/2022 17:42    Anti-infectives: Anti-infectives (From admission, onward)    Start     Dose/Rate Route Frequency Ordered Stop   07/06/22 1400  molnupiravir EUA (LAGEVRIO) capsule 800 mg       Note to Pharmacy: Pt may take home meds   4 capsule Oral 2 times daily 07/06/22 0624 07/07/22 1759   07/06/22 0800  metroNIDAZOLE (FLAGYL) IVPB 500 mg        500 mg 100 mL/hr over 60 Minutes Intravenous Every 12 hours 07/06/22 0434     07/06/22 0600  ciprofloxacin (CIPRO) IVPB 400 mg        400 mg 200 mL/hr over 60 Minutes Intravenous Every 12 hours 07/06/22 0434     07/05/22 2100  **Patient Supplied*Molnupiravir EUA (LAGEVRIO) capsule 800 mg        4 capsule Oral  Once 07/05/22 2046 07/05/22 2100   07/05/22 1800  ciprofloxacin (CIPRO) IVPB 400 mg        400 mg 200 mL/hr over 60 Minutes Intravenous  Once 07/05/22 1756 07/05/22 2033   07/05/22 1800   metroNIDAZOLE (FLAGYL) IVPB 500 mg        500 mg 100 mL/hr over 60 Minutes Intravenous  Once 07/05/22 1756 07/05/22 2104        Assessment/Plan Acute sigmoid diverticulitis with microperforation - 1st bout, unsure when her last colonoscopy was but thinks it was >5 years ago - CT 10/2 shows colonic diverticulosis with perforated acute sigmoid diverticulitis and trace volume pneumoperitoneum - Improving. No indication for acute surgical intervention. Continue IV antibiotics today, try clears.  ID - cipro/flagyl FEN - IVF, clears VTE - sq heparin Foley - none  COVID-19 infection HTN HLD Tobacco abuse  I reviewed hospitalist notes, last 24 h vitals and pain scores, last 48 h intake and output, last 24 h labs and trends, and last 24 h imaging results.    LOS: 2 days    Clovis Riley, [facility] 07/07/2022, 8:35 AM Please see Amion for pager number during day hours 7:00am-4:30pm

## 2022-07-07 NOTE — TOC Initial Note (Signed)
Transition of Care [facility]) - Initial/Assessment Note    Patient Details  Name: Melissa Waters MRN: [id number] Date of Birth: [birth year]  Transition of Care [facility]) CM/SW Contact:    Golda Acre, RN Phone Number: 07/07/2022, 7:39 AM  Clinical Narrative:                  Transition of Care [facility]) Screening Note   Patient Details  Name: Melissa Waters Date of Birth: 1951/06/17   Transition of Care [facility]) CM/SW Contact:    Golda Acre, RN Phone Number: 07/07/2022, 7:39 AM    Transition of Care Department [facility]) has reviewed patient and no TOC needs have been identified at this time. We will continue to monitor patient advancement through interdisciplinary progression rounds. If new patient transition needs arise, please place a TOC consult.    Expected Discharge Plan: Home/Self Care Barriers to Discharge: Continued Medical Work up   Patient Goals and CMS Choice Patient states their goals for this hospitalization and ongoing recovery are:: to return to my home and be well CMS Medicare.gov Compare Post Acute Care list provided to:: Patient Choice offered to / list presented to : Patient  Expected Discharge Plan and Services Expected Discharge Plan: Home/Self Care   Discharge Planning Services: CM Consult   Living arrangements for the past 2 months: Single Family Home                                      Prior Living Arrangements/Services Living arrangements for the past 2 months: Single Family Home Lives with:: Spouse Patient language and need for interpreter reviewed:: Yes Do you feel safe going back to the place where you live?: Yes            Criminal Activity/Legal Involvement Pertinent to Current Situation/Hospitalization: No - Comment as needed  Activities of Daily Living Home Assistive Devices/Equipment: Eyeglasses ADL Screening (condition at time of admission) Patient's cognitive ability adequate to safely complete daily activities?:  Yes Is the patient deaf or have difficulty hearing?: No Does the patient have difficulty seeing, even when wearing glasses/contacts?: No Does the patient have difficulty concentrating, remembering, or making decisions?: No Patient able to express need for assistance with ADLs?: Yes Does the patient have difficulty dressing or bathing?: No Independently performs ADLs?: Yes (appropriate for developmental age) Does the patient have difficulty walking or climbing stairs?: No Weakness of Legs: None Weakness of Arms/Hands: None  Permission Sought/Granted                  Emotional Assessment Appearance:: Appears stated age Attitude/Demeanor/Rapport: Engaged Affect (typically observed): Calm Orientation: : Oriented to Self, Oriented to Place, Oriented to  Time, Oriented to Situation Alcohol / Substance Use: Tobacco Use, Alcohol Use Psych Involvement: No (comment)  Admission diagnosis:  Pneumoperitoneum [K66.8] Diverticulitis of colon with perforation [K57.20] Lower abdominal pain [R10.30] Perforated diverticulum of small intestine [K57.00] Diarrhea, unspecified type [R19.7] COVID-19 virus infection [U07.1] Patient Active Problem List   Diagnosis Date Noted   Diverticulitis of colon with perforation 07/06/2022   COVID-19 virus infection 07/06/2022   Hyponatremia 07/06/2022   Pneumoperitoneum 07/05/2022   Statin myopathy 04/13/2021   Hyperlipidemia 10/16/2019   Family history of early CAD 07/19/2018   Tobacco abuse 07/19/2018   Essential hypertension 11/23/2013   Abnormal ECG 11/23/2013   Osteoarthritis of left hip 08/13/2013   PCP:  Daisy Floro,  MD Pharmacy:   [id number] - , [place] - [address] [place] [address] Phone: [phone] Fax: [phone]  [address] [place] [address] Phone: [phone] Fax:  [phone]     Social Determinants of Health (SDOH) Interventions    Readmission Risk Interventions   No data to display

## 2022-07-07 NOTE — Progress Notes (Signed)
PROGRESS NOTE  Melissa Waters LPF:[id number] DOB: 12-Aug-1951   PCP: [name], MD  Patient is from: Home.  Lives with husband.  Independently ambulates at baseline.  DOA: 07/05/2022 LOS: 2  Chief complaints Chief Complaint  Patient presents with   Abdominal Pain    COVID positive     Brief Narrative / Interim history: 71 year old F with PMH of osteoarthritis, hypertension, hyperlipidemia and tobacco use disorder presenting with progressive abdominal pain for about a day and a half and diarrhea with mucoid stool, and admitted for sigmoid diverticulitis with perforation and trace volume pneumoperitoneum.  Surgery recommended conservative care with IV antibiotics and bowel rest.   Of note, patient and husband tested positive for COVID-19 on 9/29.  Husband is symptomatic.  Patient started Paxlovid that evening.  She has no respiratory symptoms.  Improving.  Remains on IV antibiotics.  General surgery following.  Subjective: Seen and examined earlier this morning.  No major events overnight of this morning.  No complaints.  Pain has improved.  She has some some urge and small dark stool.  No other complaints.  Objective: Vitals:   07/07/22 0011 07/07/22 0527 07/07/22 0952 07/07/22 1243  BP: 126/82 (!) 131/95 (!) 129/91 130/85  Pulse: 70 70 71 74  Resp: 14 18 16 16   Temp: 98.6 F (37 C) 97.8 F (36.6 C) 98.3 F (36.8 C) 98.3 F (36.8 C)  TempSrc: Oral Oral Oral Oral  SpO2: 97% 97% 100% 97%  Weight:      Height:        Examination:  GENERAL: No apparent distress.  Nontoxic. HEENT: MMM.  Vision and hearing grossly intact.  NECK: Supple.  No apparent JVD.  RESP:  No IWOB.  Fair aeration bilaterally. CVS:  RRR. Heart sounds normal.  ABD/GI/GU: BS+. Abd soft, NTND.  MSK/EXT:  Moves extremities.  Arthritic changes in her hands. SKIN: no apparent skin lesion or wound NEURO: Awake and alert. Oriented appropriately.  No apparent focal neuro deficit. PSYCH: Calm.  Normal affect.   Procedures:  None  Microbiology summarized: None  Assessment and plan: Principal Problem:   Pneumoperitoneum Active Problems:   Diverticulitis of colon with perforation   Essential hypertension   Tobacco abuse   Hyperlipidemia   COVID-19 virus infection   Hyponatremia  Acute sigmoid diverticulitis with perforation and trace pneumoperitoneum: Improving. -Surgery following -Advance to CLD -IV fluid and IV antibiotics-Cipro and Flagyl -Needs colonoscopy once infection and inflammation heals.  COVID-19 infection: Tested positive on 9/29.  Started on [employer].  No respiratory symptoms. -Completing 5 days of Lagverio on 10/4. -Continue isolation precaution  Essential hypertension: Normotensive for most part. -Continue home Coreg -Avapro instead of home Diovan.  Hyponatremia: Likely due to dehydration.  Resolved. -Continue monitoring  Hyperlipidemia: -Resume Zetia once diarrhea improves  Tobacco use disorder: Quit smoking about a week ago.   Body mass index is 21.21 kg/m.           DVT prophylaxis:  heparin injection 5,000 Units Start: 07/06/22 0630 SCDs Start: 07/06/22 5329  Code Status: Full code Family Communication: None at bedside. Level of care: Med-Surg Status is: Inpatient Remains inpatient appropriate because: Acute diverticulitis with perforation and pneumoperitoneum   Final disposition: Likely home once medically stable Consultants:  General surgery  Sch Meds:  Scheduled Meds:  carvedilol  3.125 mg Oral BID WC   ezetimibe  10 mg Oral Daily   heparin  5,000 Units Subcutaneous Q8H   irbesartan  75 mg Oral Daily  Continuous Infusions:  sodium chloride 75 mL/hr at 07/07/22 1240   ciprofloxacin 400 mg (07/07/22 0522)   metronidazole 500 mg (07/07/22 0738)   PRN Meds:.acetaminophen **OR** acetaminophen, HYDROmorphone (DILAUDID) injection, oxyCODONE  Antimicrobials: Anti-infectives (From admission, onward)    Start      Dose/Rate Route Frequency Ordered Stop   07/06/22 1400  molnupiravir EUA (LAGEVRIO) capsule 800 mg       Note to Pharmacy: Pt may take home meds   4 capsule Oral 2 times daily 07/06/22 0624 07/07/22 1100   07/06/22 0800  metroNIDAZOLE (FLAGYL) IVPB 500 mg        500 mg 100 mL/hr over 60 Minutes Intravenous Every 12 hours 07/06/22 0434     07/06/22 0600  ciprofloxacin (CIPRO) IVPB 400 mg        400 mg 200 mL/hr over 60 Minutes Intravenous Every 12 hours 07/06/22 0434     07/05/22 2100  **Patient Supplied*Molnupiravir EUA (LAGEVRIO) capsule 800 mg        4 capsule Oral  Once 07/05/22 2046 07/05/22 2100   07/05/22 1800  ciprofloxacin (CIPRO) IVPB 400 mg        400 mg 200 mL/hr over 60 Minutes Intravenous  Once 07/05/22 1756 07/05/22 2033   07/05/22 1800  metroNIDAZOLE (FLAGYL) IVPB 500 mg        500 mg 100 mL/hr over 60 Minutes Intravenous  Once 07/05/22 1756 07/05/22 2104        I have personally reviewed the following labs and images: CBC: Recent Labs  Lab 07/05/22 1411 07/06/22 0625 07/07/22 0451  WBC 10.3 10.7* 10.2  NEUTROABS  --  8.6*  --   HGB 13.9 12.1 11.9*  HCT 40.3 35.5* 35.4*  MCV 88.8 89.6 92.4  PLT 238 249 294   BMP &GFR Recent Labs  Lab 07/05/22 1411 07/06/22 0625 07/07/22 0451  NA 129* 132* 136  K 4.2 3.8 3.4*  CL 93* 102 107  CO2 24 21* 20*  GLUCOSE 188* 125* 91  BUN 9 7* 5*  CREATININE 0.47 0.32* <0.30*  CALCIUM 9.6 8.8* 8.4*  MG  --  2.0 2.0  PHOS  --   --  2.2*   CrCl cannot be calculated (This lab value cannot be used to calculate CrCl because it is not a number: <0.30). Liver & Pancreas: Recent Labs  Lab 07/05/22 1411 07/06/22 0625 07/07/22 0451  AST 41 24  --   ALT 41 31  --   ALKPHOS 132* 121  --   BILITOT 0.9 1.2  --   PROT 7.0 6.2*  --   ALBUMIN 4.1 3.1* 2.8*   Recent Labs  Lab 07/05/22 1411  LIPASE 17   No results for input(s): "AMMONIA" in the last 168 hours. Diabetic: No results for input(s): "HGBA1C" in the last 72  hours. No results for input(s): "GLUCAP" in the last 168 hours. Cardiac Enzymes: No results for input(s): "CKTOTAL", "CKMB", "CKMBINDEX", "TROPONINI" in the last 168 hours. No results for input(s): "PROBNP" in the last 8760 hours. Coagulation Profile: No results for input(s): "INR", "PROTIME" in the last 168 hours. Thyroid Function Tests: Recent Labs    07/06/22 0625  TSH 0.815   Lipid Profile: No results for input(s): "CHOL", "HDL", "LDLCALC", "TRIG", "CHOLHDL", "LDLDIRECT" in the last 72 hours. Anemia Panel: No results for input(s): "VITAMINB12", "FOLATE", "FERRITIN", "TIBC", "IRON", "RETICCTPCT" in the last 72 hours. Urine analysis:    Component Value Date/Time   COLORURINE YELLOW 07/05/2022 1901   APPEARANCEUR  CLEAR 07/05/2022 1901   LABSPEC <1.005 (L) 07/05/2022 1901   PHURINE 7.5 07/05/2022 1901   GLUCOSEU NEGATIVE 07/05/2022 1901   HGBUR TRACE (A) 07/05/2022 1901   BILIRUBINUR NEGATIVE 07/05/2022 1901   KETONESUR 40 (A) 07/05/2022 1901   PROTEINUR 30 (A) 07/05/2022 1901   UROBILINOGEN 0.2 05/21/2009 1437   NITRITE NEGATIVE 07/05/2022 1901   LEUKOCYTESUR NEGATIVE 07/05/2022 1901   Sepsis Labs: Invalid input(s): "PROCALCITONIN", "LACTICIDVEN"  Microbiology: No results found for this or any previous visit (from the past 240 hour(s)).  Radiology Studies: No results found.    Melissa Waters [facility]  If 7PM-7AM, please contact night-coverage [email or web address] 07/07/2022, 3:05 PM

## 2022-07-08 DIAGNOSIS — R5381 Other malaise: Secondary | ICD-10-CM

## 2022-07-08 DIAGNOSIS — I10 Essential (primary) hypertension: Secondary | ICD-10-CM | POA: Diagnosis not present

## 2022-07-08 DIAGNOSIS — K668 Other specified disorders of peritoneum: Secondary | ICD-10-CM | POA: Diagnosis not present

## 2022-07-08 DIAGNOSIS — K5720 Diverticulitis of large intestine with perforation and abscess without bleeding: Secondary | ICD-10-CM | POA: Diagnosis not present

## 2022-07-08 DIAGNOSIS — U071 COVID-19: Secondary | ICD-10-CM | POA: Diagnosis not present

## 2022-07-08 LAB — RENAL FUNCTION PANEL
Albumin: 2.9 g/dL — ABNORMAL LOW (ref 3.5–5.0)
Anion gap: 8 (ref 5–15)
BUN: <5 mg/dL — ABNORMAL LOW (ref 8–23)
CO2: 22 mmol/L (ref 22–32)
Calcium: 8.5 mg/dL — ABNORMAL LOW (ref 8.9–10.3)
Chloride: 105 mmol/L (ref 98–111)
Creatinine, Ser: <0.3 mg/dL — ABNORMAL LOW (ref 0.44–1.00)
Glucose, Bld: 136 mg/dL — ABNORMAL HIGH (ref 70–99)
Phosphorus: 2.3 mg/dL — ABNORMAL LOW (ref 2.5–4.6)
Potassium: 4.1 mmol/L (ref 3.5–5.1)
Sodium: 135 mmol/L (ref 135–145)

## 2022-07-08 LAB — CBC
HCT: 35.1 % — ABNORMAL LOW (ref 36.0–46.0)
Hemoglobin: 11.8 g/dL — ABNORMAL LOW (ref 12.0–15.0)
MCH: 31 pg (ref 26.0–34.0)
MCHC: 33.6 g/dL (ref 30.0–36.0)
MCV: 92.1 fL (ref 80.0–100.0)
Platelets: 356 10*3/uL (ref 150–400)
RBC: 3.81 MIL/uL — ABNORMAL LOW (ref 3.87–5.11)
RDW: 12.6 % (ref 11.5–15.5)
WBC: 12.2 10*3/uL — ABNORMAL HIGH (ref 4.0–10.5)
nRBC: 0 % (ref 0.0–0.2)

## 2022-07-08 LAB — MAGNESIUM: Magnesium: 1.6 mg/dL — ABNORMAL LOW (ref 1.7–2.4)

## 2022-07-08 MED ORDER — SODIUM PHOSPHATES 45 MMOLE/15ML IV SOLN
15.0000 mmol | Freq: Once | INTRAVENOUS | Status: AC
  Administered 2022-07-08: 15 mmol via INTRAVENOUS
  Filled 2022-07-08: qty 5

## 2022-07-08 MED ORDER — POLYETHYLENE GLYCOL 3350 17 G PO PACK
17.0000 g | PACK | Freq: Every day | ORAL | Status: DC
  Administered 2022-07-08 – 2022-07-09 (×2): 17 g via ORAL
  Filled 2022-07-08 (×3): qty 1

## 2022-07-08 MED ORDER — SODIUM CHLORIDE 0.9 % IV SOLN
2.0000 g | INTRAVENOUS | Status: DC
  Administered 2022-07-08 – 2022-07-10 (×3): 2 g via INTRAVENOUS
  Filled 2022-07-08 (×4): qty 20

## 2022-07-08 MED ORDER — SACCHAROMYCES BOULARDII 250 MG PO CAPS
250.0000 mg | ORAL_CAPSULE | Freq: Two times a day (BID) | ORAL | Status: DC
  Administered 2022-07-08 – 2022-07-11 (×7): 250 mg via ORAL
  Filled 2022-07-08 (×7): qty 1

## 2022-07-08 MED ORDER — MAGNESIUM SULFATE 4 GM/100ML IV SOLN
4.0000 g | Freq: Once | INTRAVENOUS | Status: AC
  Administered 2022-07-08: 4 g via INTRAVENOUS
  Filled 2022-07-08: qty 100

## 2022-07-08 NOTE — Progress Notes (Signed)
Mobility Specialist - Progress Note   07/08/22 1118  Mobility  Activity Ambulated with assistance in room  Activity Response Tolerated well  Distance Ambulated (ft) 15 ft  $Mobility charge 1 Mobility  Level of Assistance Independent  Assistive Device None  HOB Elevated/Bed Position Self regulated  Range of Motion/Exercises Active  Transport method Ambulatory  Mobility Referral Yes   Pt received in room and agreeable to mobility. Pt walked around the bed 3x. Assisted pt w/ sit-to-stands 10x.  Pt to bathroom after session with all needs met & husband in room.  [facility]

## 2022-07-08 NOTE — Progress Notes (Signed)
[facility] Progress Note     Subjective: CC-  States that she has a little abdominal pain prior to Bms, otherwise denies pain, nausea, vomiting. Not taking any pain medication. Tolerating clear liquids. Continues to have small loose stools every few hours. WBC slightly up 12.2, afebrile  Objective: Vital signs in last 24 hours: Temp:  [98.3 F (36.8 C)-99.3 F (37.4 C)] 99.3 F (37.4 C) (10/05 0650) Pulse Rate:  [69-77] 69 (10/05 0650) Resp:  [16-18] 16 (10/05 0650) BP: (130-137)/(81-91) 130/81 (10/05 0650) SpO2:  [97 %-98 %] 98 % (10/05 0650) Last BM Date : 07/08/22  Intake/Output from previous day: 10/04 0701 - 10/05 0700 In: 2584.5 [P.O.:840; I.V.:1225; IV Piggyback:519.5] Out: -  Intake/Output this shift: No intake/output data recorded.  PE: Gen:  Alert, NAD, pleasant Cardio: RRR Pulm: rate and effort normal on room air Abd: soft, ND, nontender  Lab Results:  Recent Labs    07/07/22 0451 07/08/22 0450  WBC 10.2 12.2*  HGB 11.9* 11.8*  HCT 35.4* 35.1*  PLT 294 356   BMET Recent Labs    07/07/22 0451 07/08/22 0450  NA 136 135  K 3.4* 4.1  CL 107 105  CO2 20* 22  GLUCOSE 91 136*  BUN 5* <5*  CREATININE <0.30* <0.30*  CALCIUM 8.4* 8.5*   PT/INR No results for input(s): "LABPROT", "INR" in the last 72 hours. CMP     Component Value Date/Time   NA 135 07/08/2022 0450   K 4.1 07/08/2022 0450   CL 105 07/08/2022 0450   CO2 22 07/08/2022 0450   GLUCOSE 136 (H) 07/08/2022 0450   BUN <5 (L) 07/08/2022 0450   CREATININE <0.30 (L) 07/08/2022 0450   CALCIUM 8.5 (L) 07/08/2022 0450   PROT 6.2 (L) 07/06/2022 0625   PROT 6.2 02/20/2020 0829   ALBUMIN 2.9 (L) 07/08/2022 0450   ALBUMIN 4.4 02/20/2020 0829   AST 24 07/06/2022 0625   ALT 31 07/06/2022 0625   ALKPHOS 121 07/06/2022 0625   BILITOT 1.2 07/06/2022 0625   BILITOT 0.3 02/20/2020 0829   GFRNONAA NOT CALCULATED 07/08/2022 0450   GFRAA >90 08/15/2013 0412   Lipase     Component  Value Date/Time   LIPASE 17 07/05/2022 1411       Studies/Results: No results found.  Anti-infectives: Anti-infectives (From admission, onward)    Start     Dose/Rate Route Frequency Ordered Stop   07/06/22 1400  molnupiravir EUA (LAGEVRIO) capsule 800 mg       Note to Pharmacy: Pt may take home meds   4 capsule Oral 2 times daily 07/06/22 0624 07/07/22 1100   07/06/22 0800  metroNIDAZOLE (FLAGYL) IVPB 500 mg        500 mg 100 mL/hr over 60 Minutes Intravenous Every 12 hours 07/06/22 0434     07/06/22 0600  ciprofloxacin (CIPRO) IVPB 400 mg        400 mg 200 mL/hr over 60 Minutes Intravenous Every 12 hours 07/06/22 0434     07/05/22 2100  **Patient Supplied*Molnupiravir EUA (LAGEVRIO) capsule 800 mg        4 capsule Oral  Once 07/05/22 2046 07/05/22 2100   07/05/22 1800  ciprofloxacin (CIPRO) IVPB 400 mg        400 mg 200 mL/hr over 60 Minutes Intravenous  Once 07/05/22 1756 07/05/22 2033   07/05/22 1800  metroNIDAZOLE (FLAGYL) IVPB 500 mg        500 mg 100 mL/hr over 60 Minutes Intravenous  Once 07/05/22 1756 07/05/22 2104        Assessment/Plan Acute sigmoid diverticulitis with microperforation - 1st bout, unsure when her last colonoscopy was but thinks it was >5 years ago - CT 10/2 shows colonic diverticulosis with perforated acute sigmoid diverticulitis and trace volume pneumoperitoneum - WBC slightly up today but patient is nontender on exam. Ok for full liquids. Add probiotic. Continue IV antibiotics. Repeat labs in AM.   ID - cipro/flagyl FEN - IVF, FLD VTE - sq heparin Foley - none   COVID-19 infection HTN HLD Tobacco abuse   I reviewed hospitalist notes, last 24 h vitals and pain scores, last 48 h intake and output, last 24 h labs and trends, and last 24 h imaging results.    LOS: 3 days    [facility] 07/08/2022, 10:24 AM Please see Amion for pager number during day hours 7:00am-4:30pm

## 2022-07-08 NOTE — Plan of Care (Signed)
?  Problem: Clinical Measurements: ?Goal: Will remain free from infection ?Outcome: Progressing ?  ?

## 2022-07-08 NOTE — Progress Notes (Addendum)
PROGRESS NOTE  Melissa Waters WUJ:[id number] DOB: May 15, 1951   PCP: [name], MD  Patient is from: Home.  Lives with husband.  Independently ambulates at baseline.  DOA: 07/05/2022 LOS: 3  Chief complaints Chief Complaint  Patient presents with   Abdominal Pain    COVID positive     Brief Narrative / Interim history: 71 year old F with PMH of osteoarthritis, hypertension, hyperlipidemia and tobacco use disorder presenting with progressive abdominal pain for about a day and a half and diarrhea with mucoid stool, and admitted for sigmoid diverticulitis with perforation and trace volume pneumoperitoneum.  Surgery recommended conservative care with IV antibiotics and bowel rest.   Of note, patient and husband tested positive for COVID-19 on 9/29.  Husband is symptomatic.  Patient started Paxlovid that evening.  She has no respiratory symptoms.  Improving.  Remains on IV antibiotics.  General surgery following.  Subjective: Seen and examined earlier this morning.  No major events overnight of this morning.  Some discomfort and cramping that makes her go to bathroom every 2 hours.  No significant pain.  No nausea or vomiting.  No blood in stool.  Objective: Vitals:   07/07/22 0952 07/07/22 1243 07/07/22 1945 07/08/22 0650  BP: (!) 129/91 130/85 (!) 137/91 130/81  Pulse: 71 74 77 69  Resp: 16 16 18 16   Temp: 98.3 F (36.8 C) 98.3 F (36.8 C) 98.5 F (36.9 C) 99.3 F (37.4 C)  TempSrc: Oral Oral Oral Oral  SpO2: 100% 97% 97% 98%  Weight:      Height:        Examination:  GENERAL: No apparent distress.  Nontoxic. HEENT: MMM.  Vision and hearing grossly intact.  NECK: Supple.  No apparent JVD.  RESP:  No IWOB.  Fair aeration bilaterally. CVS:  RRR. Heart sounds normal.  ABD/GI/GU: BS+. Abd soft, NTND.  MSK/EXT:  Moves extremities.  Some arthritic changes in her hands SKIN: no apparent skin lesion or wound NEURO: Awake and alert. Oriented appropriately.  No  apparent focal neuro deficit. PSYCH: Calm. Normal affect.   Procedures:  None  Microbiology summarized: None  Assessment and plan: Principal Problem:   Pneumoperitoneum Active Problems:   Diverticulitis of colon with perforation   Essential hypertension   Tobacco abuse   Hyperlipidemia   COVID-19 virus infection   Hyponatremia  Acute sigmoid diverticulitis with perforation and trace pneumoperitoneum: Improving. -Surgery following -Advance to FLD. -Changed Cipro to ceftriaxone -Continue metronidazole -Needs colonoscopy once infection and inflammation heals.  COVID-19 infection: Tested positive on 9/29.  Started on [employer].  No respiratory symptoms. -Completed 5 days of Lagverio on 10/4. -Continue isolation precaution for 10 days from 9/29 per hospital protocol  Essential hypertension: Normotensive for most part. -Continue home Coreg -Avapro instead of home Diovan.  Hyponatremia/hypomagnesemia/hypophosphatemia:  -Monitor and replenish as appropriate  Hyperlipidemia: -Resume Zetia once diarrhea improves  Tobacco use disorder: Quit smoking about a week ago.   Physical deconditioning -PT/OT eval  Body mass index is 21.21 kg/m.           DVT prophylaxis:  heparin injection 5,000 Units Start: 07/06/22 0630 SCDs Start: 07/06/22 9562  Code Status: Full code Family Communication: Updated patient's husband at bedside Level of care: Med-Surg Status is: Inpatient Remains inpatient appropriate because: Acute diverticulitis with perforation and pneumoperitoneum   Final disposition: Likely home once medically stable Consultants:  General surgery  Sch Meds:  Scheduled Meds:  carvedilol  3.125 mg Oral BID WC   ezetimibe  10  mg Oral Daily   heparin  5,000 Units Subcutaneous Q8H   irbesartan  75 mg Oral Daily   saccharomyces boulardii  250 mg Oral BID   Continuous Infusions:  sodium chloride 75 mL/hr at 07/08/22 1033   cefTRIAXone (ROCEPHIN)  IV      metronidazole 500 mg (07/08/22 1030)   sodium phosphate 15 mmol in dextrose 5 % 250 mL infusion 15 mmol (07/08/22 1159)   PRN Meds:.acetaminophen **OR** acetaminophen, HYDROmorphone (DILAUDID) injection, oxyCODONE  Antimicrobials: Anti-infectives (From admission, onward)    Start     Dose/Rate Route Frequency Ordered Stop   07/08/22 1800  cefTRIAXone (ROCEPHIN) 2 g in sodium chloride 0.9 % 100 mL IVPB        2 g 200 mL/hr over 30 Minutes Intravenous Every 24 hours 07/08/22 1204     07/06/22 1400  molnupiravir EUA (LAGEVRIO) capsule 800 mg       Note to Pharmacy: Pt may take home meds   4 capsule Oral 2 times daily 07/06/22 0624 07/07/22 1100   07/06/22 0800  metroNIDAZOLE (FLAGYL) IVPB 500 mg        500 mg 100 mL/hr over 60 Minutes Intravenous Every 12 hours 07/06/22 0434     07/06/22 0600  ciprofloxacin (CIPRO) IVPB 400 mg  Status:  Discontinued        400 mg 200 mL/hr over 60 Minutes Intravenous Every 12 hours 07/06/22 0434 07/08/22 1203   07/05/22 2100  **Patient Supplied*Molnupiravir EUA (LAGEVRIO) capsule 800 mg        4 capsule Oral  Once 07/05/22 2046 07/05/22 2100   07/05/22 1800  ciprofloxacin (CIPRO) IVPB 400 mg        400 mg 200 mL/hr over 60 Minutes Intravenous  Once 07/05/22 1756 07/05/22 2033   07/05/22 1800  metroNIDAZOLE (FLAGYL) IVPB 500 mg        500 mg 100 mL/hr over 60 Minutes Intravenous  Once 07/05/22 1756 07/05/22 2104        I have personally reviewed the following labs and images: CBC: Recent Labs  Lab 07/05/22 1411 07/06/22 0625 07/07/22 0451 07/08/22 0450  WBC 10.3 10.7* 10.2 12.2*  NEUTROABS  --  8.6*  --   --   HGB 13.9 12.1 11.9* 11.8*  HCT 40.3 35.5* 35.4* 35.1*  MCV 88.8 89.6 92.4 92.1  PLT 238 249 294 356   BMP &GFR Recent Labs  Lab 07/05/22 1411 07/06/22 0625 07/07/22 0451 07/08/22 0450  NA 129* 132* 136 135  K 4.2 3.8 3.4* 4.1  CL 93* 102 107 105  CO2 24 21* 20* 22  GLUCOSE 188* 125* 91 136*  BUN 9 7* 5* <5*  CREATININE  0.47 0.32* <0.30* <0.30*  CALCIUM 9.6 8.8* 8.4* 8.5*  MG  --  2.0 2.0 1.6*  PHOS  --   --  2.2* 2.3*   CrCl cannot be calculated (This lab value cannot be used to calculate CrCl because it is not a number: <0.30). Liver & Pancreas: Recent Labs  Lab 07/05/22 1411 07/06/22 0625 07/07/22 0451 07/08/22 0450  AST 41 24  --   --   ALT 41 31  --   --   ALKPHOS 132* 121  --   --   BILITOT 0.9 1.2  --   --   PROT 7.0 6.2*  --   --   ALBUMIN 4.1 3.1* 2.8* 2.9*   Recent Labs  Lab 07/05/22 1411  LIPASE 17   No results for input(s): "  AMMONIA" in the last 168 hours. Diabetic: No results for input(s): "HGBA1C" in the last 72 hours. No results for input(s): "GLUCAP" in the last 168 hours. Cardiac Enzymes: No results for input(s): "CKTOTAL", "CKMB", "CKMBINDEX", "TROPONINI" in the last 168 hours. No results for input(s): "PROBNP" in the last 8760 hours. Coagulation Profile: No results for input(s): "INR", "PROTIME" in the last 168 hours. Thyroid Function Tests: Recent Labs    07/06/22 0625  TSH 0.815   Lipid Profile: No results for input(s): "CHOL", "HDL", "LDLCALC", "TRIG", "CHOLHDL", "LDLDIRECT" in the last 72 hours. Anemia Panel: No results for input(s): "VITAMINB12", "FOLATE", "FERRITIN", "TIBC", "IRON", "RETICCTPCT" in the last 72 hours. Urine analysis:    Component Value Date/Time   COLORURINE YELLOW 07/05/2022 1901   APPEARANCEUR CLEAR 07/05/2022 1901   LABSPEC <1.005 (L) 07/05/2022 1901   PHURINE 7.5 07/05/2022 1901   GLUCOSEU NEGATIVE 07/05/2022 1901   HGBUR TRACE (A) 07/05/2022 1901   BILIRUBINUR NEGATIVE 07/05/2022 1901   KETONESUR 40 (A) 07/05/2022 1901   PROTEINUR 30 (A) 07/05/2022 1901   UROBILINOGEN 0.2 05/21/2009 1437   NITRITE NEGATIVE 07/05/2022 1901   LEUKOCYTESUR NEGATIVE 07/05/2022 1901   Sepsis Labs: Invalid input(s): "PROCALCITONIN", "LACTICIDVEN"  Microbiology: No results found for this or any previous visit (from the past 240  hour(s)).  Radiology Studies: No results found.    Camia Dipinto T. Curtiss Mahmood [facility]  If 7PM-7AM, please contact night-coverage [email or web address] 07/08/2022, 12:58 PM

## 2022-07-08 NOTE — Care Management Important Message (Signed)
Important Message  Patient Details IM Letter given Name: Melissa Waters MRN: [id number] Date of Birth: 10/17/1950   Medicare Important Message Given:  Yes     [name] 07/08/2022, 2:48 PM

## 2022-07-09 DIAGNOSIS — D72829 Elevated white blood cell count, unspecified: Secondary | ICD-10-CM

## 2022-07-09 LAB — CBC
HCT: 35.2 % — ABNORMAL LOW (ref 36.0–46.0)
Hemoglobin: 11.9 g/dL — ABNORMAL LOW (ref 12.0–15.0)
MCH: 30.9 pg (ref 26.0–34.0)
MCHC: 33.8 g/dL (ref 30.0–36.0)
MCV: 91.4 fL (ref 80.0–100.0)
Platelets: 441 10*3/uL — ABNORMAL HIGH (ref 150–400)
RBC: 3.85 MIL/uL — ABNORMAL LOW (ref 3.87–5.11)
RDW: 12.6 % (ref 11.5–15.5)
WBC: 14.3 10*3/uL — ABNORMAL HIGH (ref 4.0–10.5)
nRBC: 0 % (ref 0.0–0.2)

## 2022-07-09 MED ORDER — MELATONIN 3 MG PO TABS
3.0000 mg | ORAL_TABLET | Freq: Every day | ORAL | Status: AC
  Administered 2022-07-09: 3 mg via ORAL
  Filled 2022-07-09: qty 1

## 2022-07-09 NOTE — Progress Notes (Signed)
PROGRESS NOTE  Melissa Waters:[id number] DOB: 07-23-1951   PCP: Melissa Cruel, MD  Patient is from: Home.  Lives with husband.  Independently ambulates at baseline.  DOA: 07/05/2022 LOS: 4  Chief complaints Chief Complaint  Patient presents with   Abdominal Pain    COVID positive     Brief Narrative / Interim history: 71 year old F with PMH of osteoarthritis, hypertension, hyperlipidemia and tobacco use disorder presenting with progressive abdominal pain for about a day and a half and diarrhea with mucoid stool, and admitted for sigmoid diverticulitis with perforation and trace volume pneumoperitoneum.  Surgery recommended conservative care with IV antibiotics and bowel rest.   Of note, patient and husband tested positive for COVID-19 on 9/29.  Husband is symptomatic.  Patient started Paxlovid that evening.  She has no respiratory symptoms.  Improving.  Remains on IV antibiotics.  General surgery following.  Subjective: Seen and examined earlier this morning.  No major events overnight of this morning.  She is eager to go home but understands the need to stay especially with rising leukocytosis.  She denies pain.  Reports small bowel movement this morning.  Denies nausea or vomiting.  Patient's husband at bedside.  Objective: Vitals:   07/08/22 0650 07/08/22 1331 07/08/22 1933 07/09/22 0450  BP: 130/81 133/80 137/83 136/78  Pulse: 69 100 71 74  Resp: 16 18 18 17   Temp: 99.3 F (37.4 C) 98.1 F (36.7 C) 98.2 F (36.8 C) 99.4 F (37.4 C)  TempSrc: Oral Oral Oral Oral  SpO2: 98% (!) 74% 100% 97%  Weight:      Height:        Examination:  GENERAL: No apparent distress.  Nontoxic. HEENT: MMM.  Vision and hearing grossly intact.  NECK: Supple.  No apparent JVD.  RESP:  No IWOB.  Fair aeration bilaterally. CVS:  RRR. Heart sounds normal.  ABD/GI/GU: BS+. Abd soft, NTND.  MSK/EXT:  Moves extremities. No apparent deformity. No edema.  SKIN: no apparent skin lesion  or wound NEURO: Awake and alert. Oriented appropriately.  No apparent focal neuro deficit. PSYCH: Calm. Normal affect.   Procedures:  None  Microbiology summarized: None  Assessment and plan: Principal Problem:   Pneumoperitoneum Active Problems:   Diverticulitis of colon with perforation   Essential hypertension   Tobacco abuse   Hyperlipidemia   COVID-19 virus infection   Hyponatremia  Acute sigmoid diverticulitis with perforation and trace pneumoperitoneum: Symptoms basically resolved.  She has slightly elevated leukocytosis this morning.  Abdominal exam benign.  Tolerating full liquid diet. -Surgery following-plan for CT abdomen and pelvis if further rising leukocytosis -On full liquid diet. -Continue ceftriaxone and Flagyl. -Needs colonoscopy once infection and inflammation heals.  COVID-19 infection: Tested positive on 9/29.  Started on [employer].  No respiratory symptoms. -Completed 5 days of Lagverio on 10/4. -Continue isolation precaution for 10 days from 9/29 per hospital protocol  Essential hypertension: Normotensive for most part. -Continue home Coreg -Avapro instead of home Diovan.  Hyponatremia/hypomagnesemia/hypophosphatemia:  -Monitor and replenish as appropriate  Hyperlipidemia: -Resume Zetia once diarrhea improves  Tobacco use disorder: Quit smoking about a week ago.   Leukocytosis: Trended up. -Recheck CBC with differential in the morning  Physical deconditioning -PT/OT eval  Body mass index is 21.21 kg/m.           DVT prophylaxis:  heparin injection 5,000 Units Start: 07/06/22 0630 SCDs Start: 07/06/22 3220  Code Status: Full code Family Communication: Updated patient's husband at bedside Level of care:  Med-Surg Status is: Inpatient Remains inpatient appropriate because: Acute diverticulitis with perforation and pneumoperitoneum   Final disposition: Likely home once medically stable Consultants:  General surgery  Sch Meds:   Scheduled Meds:  carvedilol  3.125 mg Oral BID WC   ezetimibe  10 mg Oral Daily   heparin  5,000 Units Subcutaneous Q8H   irbesartan  75 mg Oral Daily   polyethylene glycol  17 g Oral Daily   saccharomyces boulardii  250 mg Oral BID   Continuous Infusions:  sodium chloride 75 mL/hr at 07/09/22 0619   cefTRIAXone (ROCEPHIN)  IV Stopped (07/08/22 2015)   metronidazole 500 mg (07/09/22 0938)   PRN Meds:.acetaminophen **OR** acetaminophen, HYDROmorphone (DILAUDID) injection, oxyCODONE  Antimicrobials: Anti-infectives (From admission, onward)    Start     Dose/Rate Route Frequency Ordered Stop   07/08/22 2000  cefTRIAXone (ROCEPHIN) 2 g in sodium chloride 0.9 % 100 mL IVPB        2 g 200 mL/hr over 30 Minutes Intravenous Every 24 hours 07/08/22 1204     07/06/22 1400  molnupiravir EUA (LAGEVRIO) capsule 800 mg       Note to Pharmacy: Pt may take home meds   4 capsule Oral 2 times daily 07/06/22 0624 07/07/22 1100   07/06/22 0800  metroNIDAZOLE (FLAGYL) IVPB 500 mg        500 mg 100 mL/hr over 60 Minutes Intravenous Every 12 hours 07/06/22 0434     07/06/22 0600  ciprofloxacin (CIPRO) IVPB 400 mg  Status:  Discontinued        400 mg 200 mL/hr over 60 Minutes Intravenous Every 12 hours 07/06/22 0434 07/08/22 1203   07/05/22 2100  **Patient Supplied*Molnupiravir EUA (LAGEVRIO) capsule 800 mg        4 capsule Oral  Once 07/05/22 2046 07/05/22 2100   07/05/22 1800  ciprofloxacin (CIPRO) IVPB 400 mg        400 mg 200 mL/hr over 60 Minutes Intravenous  Once 07/05/22 1756 07/05/22 2033   07/05/22 1800  metroNIDAZOLE (FLAGYL) IVPB 500 mg        500 mg 100 mL/hr over 60 Minutes Intravenous  Once 07/05/22 1756 07/05/22 2104        I have personally reviewed the following labs and images: CBC: Recent Labs  Lab 07/05/22 1411 07/06/22 0625 07/07/22 0451 07/08/22 0450 07/09/22 0437  WBC 10.3 10.7* 10.2 12.2* 14.3*  NEUTROABS  --  8.6*  --   --   --   HGB 13.9 12.1 11.9* 11.8*  11.9*  HCT 40.3 35.5* 35.4* 35.1* 35.2*  MCV 88.8 89.6 92.4 92.1 91.4  PLT 238 249 294 356 441*   BMP &GFR Recent Labs  Lab 07/05/22 1411 07/06/22 0625 07/07/22 0451 07/08/22 0450  NA 129* 132* 136 135  K 4.2 3.8 3.4* 4.1  CL 93* 102 107 105  CO2 24 21* 20* 22  GLUCOSE 188* 125* 91 136*  BUN 9 7* 5* <5*  CREATININE 0.47 0.32* <0.30* <0.30*  CALCIUM 9.6 8.8* 8.4* 8.5*  MG  --  2.0 2.0 1.6*  PHOS  --   --  2.2* 2.3*   CrCl cannot be calculated (This lab value cannot be used to calculate CrCl because it is not a number: <0.30). Liver & Pancreas: Recent Labs  Lab 07/05/22 1411 07/06/22 0625 07/07/22 0451 07/08/22 0450  AST 41 24  --   --   ALT 41 31  --   --   ALKPHOS 132* 121  --   --  BILITOT 0.9 1.2  --   --   PROT 7.0 6.2*  --   --   ALBUMIN 4.1 3.1* 2.8* 2.9*   Recent Labs  Lab 07/05/22 1411  LIPASE 17   No results for input(s): "AMMONIA" in the last 168 hours. Diabetic: No results for input(s): "HGBA1C" in the last 72 hours. No results for input(s): "GLUCAP" in the last 168 hours. Cardiac Enzymes: No results for input(s): "CKTOTAL", "CKMB", "CKMBINDEX", "TROPONINI" in the last 168 hours. No results for input(s): "PROBNP" in the last 8760 hours. Coagulation Profile: No results for input(s): "INR", "PROTIME" in the last 168 hours. Thyroid Function Tests: No results for input(s): "TSH", "T4TOTAL", "FREET4", "T3FREE", "THYROIDAB" in the last 72 hours.  Lipid Profile: No results for input(s): "CHOL", "HDL", "LDLCALC", "TRIG", "CHOLHDL", "LDLDIRECT" in the last 72 hours. Anemia Panel: No results for input(s): "VITAMINB12", "FOLATE", "FERRITIN", "TIBC", "IRON", "RETICCTPCT" in the last 72 hours. Urine analysis:    Component Value Date/Time   COLORURINE YELLOW 07/05/2022 1901   APPEARANCEUR CLEAR 07/05/2022 1901   LABSPEC <1.005 (L) 07/05/2022 1901   PHURINE 7.5 07/05/2022 1901   GLUCOSEU NEGATIVE 07/05/2022 1901   HGBUR TRACE (A) 07/05/2022 1901    BILIRUBINUR NEGATIVE 07/05/2022 1901   KETONESUR 40 (A) 07/05/2022 1901   PROTEINUR 30 (A) 07/05/2022 1901   UROBILINOGEN 0.2 05/21/2009 1437   NITRITE NEGATIVE 07/05/2022 1901   LEUKOCYTESUR NEGATIVE 07/05/2022 1901   Sepsis Labs: Invalid input(s): "PROCALCITONIN", "LACTICIDVEN"  Microbiology: No results found for this or any previous visit (from the past 240 hour(s)).  Radiology Studies: No results found.    Bobbyjoe Pabst T. [place]  If 7PM-7AM, please contact night-coverage [email or web address] 07/09/2022, 1:34 PM

## 2022-07-09 NOTE — Progress Notes (Signed)
[facility] Progress Note     Subjective: CC-  Having some mild crampy lower abdominal pain at times. It is not severe and is self limiting. Denies n/v. Tolerating full liquids. Diarrhea improved - she had 2 loose stools yesterday and none so far today. WBC up 14. 3, TMAX 99.4  Objective: Vital signs in last 24 hours: Temp:  [98.1 F (36.7 C)-99.4 F (37.4 C)] 99.4 F (37.4 C) (10/06 0450) Pulse Rate:  [71-100] 74 (10/06 0450) Resp:  [17-18] 17 (10/06 0450) BP: (133-137)/(78-83) 136/78 (10/06 0450) SpO2:  [74 %-100 %] 97 % (10/06 0450) Last BM Date : 07/08/22  Intake/Output from previous day: 10/05 0701 - 10/06 0700 In: 2936.2 [P.O.:1200; I.V.:1100; IV Piggyback:636.2] Out: 0  Intake/Output this shift: No intake/output data recorded.  PE: Gen:  Alert, NAD, pleasant Pulm: rate and effort normal on room air Abd: soft, ND, nontender  Lab Results:  Recent Labs    07/08/22 0450 07/09/22 0437  WBC 12.2* 14.3*  HGB 11.8* 11.9*  HCT 35.1* 35.2*  PLT 356 441*   BMET Recent Labs    07/07/22 0451 07/08/22 0450  NA 136 135  K 3.4* 4.1  CL 107 105  CO2 20* 22  GLUCOSE 91 136*  BUN 5* <5*  CREATININE <0.30* <0.30*  CALCIUM 8.4* 8.5*   PT/INR No results for input(s): "LABPROT", "INR" in the last 72 hours. CMP     Component Value Date/Time   NA 135 07/08/2022 0450   K 4.1 07/08/2022 0450   CL 105 07/08/2022 0450   CO2 22 07/08/2022 0450   GLUCOSE 136 (H) 07/08/2022 0450   BUN <5 (L) 07/08/2022 0450   CREATININE <0.30 (L) 07/08/2022 0450   CALCIUM 8.5 (L) 07/08/2022 0450   PROT 6.2 (L) 07/06/2022 0625   PROT 6.2 02/20/2020 0829   ALBUMIN 2.9 (L) 07/08/2022 0450   ALBUMIN 4.4 02/20/2020 0829   AST 24 07/06/2022 0625   ALT 31 07/06/2022 0625   ALKPHOS 121 07/06/2022 0625   BILITOT 1.2 07/06/2022 0625   BILITOT 0.3 02/20/2020 0829   GFRNONAA NOT CALCULATED 07/08/2022 0450   GFRAA >90 08/15/2013 0412   Lipase     Component Value Date/Time    LIPASE 17 07/05/2022 1411       Studies/Results: No results found.  Anti-infectives: Anti-infectives (From admission, onward)    Start     Dose/Rate Route Frequency Ordered Stop   07/08/22 2000  cefTRIAXone (ROCEPHIN) 2 g in sodium chloride 0.9 % 100 mL IVPB        2 g 200 mL/hr over 30 Minutes Intravenous Every 24 hours 07/08/22 1204     07/06/22 1400  molnupiravir EUA (LAGEVRIO) capsule 800 mg       Note to Pharmacy: Pt may take home meds   4 capsule Oral 2 times daily 07/06/22 0624 07/07/22 1100   07/06/22 0800  metroNIDAZOLE (FLAGYL) IVPB 500 mg        500 mg 100 mL/hr over 60 Minutes Intravenous Every 12 hours 07/06/22 0434     07/06/22 0600  ciprofloxacin (CIPRO) IVPB 400 mg  Status:  Discontinued        400 mg 200 mL/hr over 60 Minutes Intravenous Every 12 hours 07/06/22 0434 07/08/22 1203   07/05/22 2100  **Patient Supplied*Molnupiravir EUA (LAGEVRIO) capsule 800 mg        4 capsule Oral  Once 07/05/22 2046 07/05/22 2100   07/05/22 1800  ciprofloxacin (CIPRO) IVPB 400 mg  400 mg 200 mL/hr over 60 Minutes Intravenous  Once 07/05/22 1756 07/05/22 2033   07/05/22 1800  metroNIDAZOLE (FLAGYL) IVPB 500 mg        500 mg 100 mL/hr over 60 Minutes Intravenous  Once 07/05/22 1756 07/05/22 2104        Assessment/Plan Acute sigmoid diverticulitis with microperforation - 1st bout, unsure when her last colonoscopy was but thinks it was >5 years ago - CT 10/2 shows colonic diverticulosis with perforated acute sigmoid diverticulitis and trace volume pneumoperitoneum - Abdominal exam remains completely benign, but she is having some mild intermittent crampy abdominal pain and leukocytosis slightly worse today. Will discuss role for/ timing of repeat CT scan with MD. Continue IV antibiotics and full liquids for now.   ID - cipro/flagyl FEN - IVF, FLD VTE - sq heparin Foley - none   COVID-19 infection HTN HLD Tobacco abuse   I reviewed hospitalist notes, last 24 h  vitals and pain scores, last 48 h intake and output, last 24 h labs and trends, and last 24 h imaging results.    LOS: 4 days    [facility] 07/09/2022, 9:42 AM Please see Amion for pager number during day hours 7:00am-4:30pm

## 2022-07-09 NOTE — Plan of Care (Signed)
  Problem: Clinical Measurements: Goal: Cardiovascular complication will be avoided Outcome: Progressing   

## 2022-07-09 NOTE — Evaluation (Signed)
Physical Therapy Evaluation Patient Details Name: Melissa Waters MRN: [id number] DOB: 03-Oct-1951 Today's Date: 07/09/2022  History of Present Illness  Patient is a 71 year old woman with PMH of osteoarthritis, hypertension, hyperlipidemia and tobacco use disorder presenting with progressive abdominal pain for about a day and a half and diarrhea with mucoid stool, and admitted for sigmoid diverticulitis with perforation and trace volume pneumoperitoneum.  Surgery recommended conservative care with IV antibiotics and bowel rest. Of note, patient and husband tested positive for COVID-19 on 9/29.  Husband is symptomatic.  Patient started Paxlovid that evening.  She has no respiratory symptoms.  Clinical Impression      Pt admitted with above diagnosis.    Pt doing well on this date. Reports having bowel urgency and abd pain earlier today however currently no pain.  Pt agreeable to amb with PT. Gait steady with and without IV pole, pt feels she is at baseline.   Not further PT needs at this time.     Recommendations for follow up therapy are one component of a multi-disciplinary discharge planning process, led by the attending physician.  Recommendations may be updated based on patient status, additional functional criteria and insurance authorization.  Follow Up Recommendations No PT follow up      Assistance Recommended at Discharge PRN  Patient can return home with the following       Equipment Recommendations None recommended by PT  Recommendations for Other Services       Functional Status Assessment Patient has not had a recent decline in their functional status     Precautions / Restrictions Precautions Precautions: None Restrictions Weight Bearing Restrictions: No      Mobility  Bed Mobility Overal bed mobility: Independent                  Transfers Overall transfer level: Independent                      Ambulation/Gait Ambulation/Gait  assistance: Supervision, Modified independent (Device/Increase time) Gait Distance (Feet): 160 Feet Assistive device: None, IV Pole Gait Pattern/deviations: Step-through pattern       General Gait Details: steady gait, kyphotic posture, no LOB with and without IV pole  Stairs            Wheelchair Mobility    Modified Rankin (Stroke Patients Only)       Balance Overall balance assessment: No apparent balance deficits (not formally assessed) (pt is abel to stand and doff socks bil and don shoes without LOB)                                           Pertinent Vitals/Pain Pain Assessment Pain Assessment: No/denies pain    Home Living Family/patient expects to be discharged to:: Private residence Living Arrangements: Spouse/significant other Available Help at Discharge: Family;Available 24 hours/day Type of Home: House Home Access: Stairs to enter Entrance Stairs-Rails: None Entrance Stairs-Number of Steps: 2   Home Layout: One level Home Equipment: Conservation officer, nature (2 wheels);Cane - single point;Grab bars - tub/shower      Prior Function Prior Level of Function : Independent/Modified Independent                     Hand Dominance        Extremity/Trunk Assessment   Upper Extremity Assessment Upper Extremity Assessment:  Overall [facility] for tasks assessed    Lower Extremity Assessment Lower Extremity Assessment: Overall WFL for tasks assessed    Cervical / Trunk Assessment Cervical / Trunk Assessment: Normal  Communication      Cognition Arousal/Alertness: Awake/alert Behavior During Therapy: WFL for tasks assessed/performed Overall Cognitive Status: Within Functional Limits for tasks assessed                                          General Comments      Exercises     Assessment/Plan    PT Assessment Patient does not need any further PT services  PT Problem List         PT Treatment Interventions       PT Goals (Current goals can be found in the Care Plan section)  Acute Rehab PT Goals PT Goal Formulation: With patient Potential to Achieve Goals: Good    Frequency       Co-evaluation               AM-PAC PT "6 Clicks" Mobility  Outcome Measure Help needed turning from your back to your side while in a flat bed without using bedrails?: None Help needed moving from lying on your back to sitting on the side of a flat bed without using bedrails?: None Help needed moving to and from a bed to a chair (including a wheelchair)?: None Help needed standing up from a chair using your arms (e.g., wheelchair or bedside chair)?: None Help needed to walk in hospital room?: None Help needed climbing 3-5 steps with a railing? : A Little 6 Click Score: 23    End of Session   Activity Tolerance: Patient tolerated treatment well Patient left: in bed;with call bell/phone within reach   PT Visit Diagnosis: Other abnormalities of gait and mobility (R26.89)    Time: 7616-0737 PT Time Calculation (min) (ACUTE ONLY): 20 min   Charges:   PT Evaluation $PT Eval Low Complexity: 1 Low          Thien Berka, PT  Acute Rehab Dept [facility]) [phone]  [facility] Weekend Pager (Saturday/Sunday only)  [phone]  07/09/2022   [facility] 07/09/2022, 3:45 PM

## 2022-07-09 NOTE — Evaluation (Signed)
Occupational Therapy Evaluation Patient Details Name: Melissa Waters MRN: [id number] DOB: 09-17-1951 Today's Date: 07/09/2022   History of Present Illness Patient is a 71 year old woman with PMH of osteoarthritis, hypertension, hyperlipidemia and tobacco use disorder presenting with progressive abdominal pain for about a day and a half and diarrhea with mucoid stool, and admitted for sigmoid diverticulitis with perforation and trace volume pneumoperitoneum.  Surgery recommended conservative care with IV antibiotics and bowel rest. Of note, patient and husband tested positive for COVID-19 on 9/29.  Husband is symptomatic.  Patient started Paxlovid that evening.  She has no respiratory symptoms.   Clinical Impression   Mrs. Ceciley Buist is a 71 year old woman who presents with above medical history. On evaluation she is able perform all ADLs independently and ambulate in room without a device. She is managing the IV pole. She has no significant complaints in regards to weakness. She has no apparent deficits. Patient has no OT needs. Will have assistance of husband at home if needed.       Recommendations for follow up therapy are one component of a multi-disciplinary discharge planning process, led by the attending physician.  Recommendations may be updated based on patient status, additional functional criteria and insurance authorization.   Follow Up Recommendations  No OT follow up    Assistance Recommended at Discharge None  Patient can return home with the following Assistance with cooking/housework    Functional Status Assessment  Patient has not had a recent decline in their functional status  Equipment Recommendations  None recommended by OT    Recommendations for Other Services       Precautions / Restrictions Precautions Precautions: None Restrictions Weight Bearing Restrictions: No      Mobility Bed Mobility Overal bed mobility: Independent                   Transfers Overall transfer level: Independent                        Balance Overall balance assessment: No apparent balance deficits (not formally assessed)                                         ADL either performed or assessed with clinical judgement   ADL Overall ADL's : Independent                                             Vision Patient Visual Report: No change from baseline       Perception     Praxis      Pertinent Vitals/Pain Pain Assessment Pain Assessment: No/denies pain     Hand Dominance Right   Extremity/Trunk Assessment Upper Extremity Assessment Upper Extremity Assessment: Overall WFL for tasks assessed   Lower Extremity Assessment Lower Extremity Assessment: Overall WFL for tasks assessed   Cervical / Trunk Assessment Cervical / Trunk Assessment: Normal   Communication Communication Communication: No difficulties   Cognition Arousal/Alertness: Awake/alert Behavior During Therapy: WFL for tasks assessed/performed Overall Cognitive Status: Within Functional Limits for tasks assessed  General Comments       Exercises     Shoulder Instructions      Home Living Family/patient expects to be discharged to:: Private residence Living Arrangements: Spouse/significant other Available Help at Discharge: Family;Available 24 hours/day Type of Home: House Home Access: Stairs to enter [employer] of Steps: 2 Entrance Stairs-Rails: None Home Layout: One level     Bathroom Shower/Tub: Teacher, early years/pre: Standard     Home Equipment: Conservation officer, nature (2 wheels);Cane - single point;Grab bars - tub/shower          Prior Functioning/Environment Prior Level of Function : Independent/Modified Independent                        OT Problem List:        OT Treatment/Interventions:      OT Goals(Current  goals can be found in the care plan section) Acute Rehab OT Goals OT Goal Formulation: All assessment and education complete, DC therapy  OT Frequency:      Co-evaluation              AM-PAC OT "6 Clicks" Daily Activity     Outcome Measure Help from another person eating meals?: None Help from another person taking care of personal grooming?: None Help from another person toileting, which includes using toliet, bedpan, or urinal?: None Help from another person bathing (including washing, rinsing, drying)?: None Help from another person to put on and taking off regular upper body clothing?: None Help from another person to put on and taking off regular lower body clothing?: None 6 Click Score: 24   End of Session Nurse Communication:  (okay to see)  Activity Tolerance: Patient tolerated treatment well Patient left: in bed;with call bell/phone within reach;with family/visitor present  OT Visit Diagnosis: Pain                Time: 1034-1050 OT Time Calculation (min): 16 min Charges:  OT General Charges $OT Visit: 1 Visit OT Evaluation $OT Eval Low Complexity: 1 Low  Gustavo Lah, OTR/L [place]  Office [phone]   Lenward Chancellor 07/09/2022, 1:42 PM

## 2022-07-10 ENCOUNTER — Inpatient Hospital Stay (HOSPITAL_COMMUNITY): Payer: Medicare Other

## 2022-07-10 LAB — CBC WITH DIFFERENTIAL/PLATELET
Abs Immature Granulocytes: 0.2 10*3/uL — ABNORMAL HIGH (ref 0.00–0.07)
Basophils Absolute: 0.1 10*3/uL (ref 0.0–0.1)
Basophils Relative: 1 %
Eosinophils Absolute: 0.2 10*3/uL (ref 0.0–0.5)
Eosinophils Relative: 1 %
HCT: 33.6 % — ABNORMAL LOW (ref 36.0–46.0)
Hemoglobin: 11.1 g/dL — ABNORMAL LOW (ref 12.0–15.0)
Immature Granulocytes: 1 %
Lymphocytes Relative: 11 %
Lymphs Abs: 1.7 10*3/uL (ref 0.7–4.0)
MCH: 30.6 pg (ref 26.0–34.0)
MCHC: 33 g/dL (ref 30.0–36.0)
MCV: 92.6 fL (ref 80.0–100.0)
Monocytes Absolute: 1.8 10*3/uL — ABNORMAL HIGH (ref 0.1–1.0)
Monocytes Relative: 11 %
Neutro Abs: 11.7 10*3/uL — ABNORMAL HIGH (ref 1.7–7.7)
Neutrophils Relative %: 75 %
Platelets: 462 10*3/uL — ABNORMAL HIGH (ref 150–400)
RBC: 3.63 MIL/uL — ABNORMAL LOW (ref 3.87–5.11)
RDW: 12.7 % (ref 11.5–15.5)
WBC: 15.5 10*3/uL — ABNORMAL HIGH (ref 4.0–10.5)
nRBC: 0 % (ref 0.0–0.2)

## 2022-07-10 LAB — RENAL FUNCTION PANEL
Albumin: 2.7 g/dL — ABNORMAL LOW (ref 3.5–5.0)
Anion gap: 6 (ref 5–15)
BUN: <5 mg/dL — ABNORMAL LOW (ref 8–23)
CO2: 27 mmol/L (ref 22–32)
Calcium: 8.3 mg/dL — ABNORMAL LOW (ref 8.9–10.3)
Chloride: 106 mmol/L (ref 98–111)
Creatinine, Ser: <0.3 mg/dL — ABNORMAL LOW (ref 0.44–1.00)
Glucose, Bld: 114 mg/dL — ABNORMAL HIGH (ref 70–99)
Phosphorus: 3.6 mg/dL (ref 2.5–4.6)
Potassium: 3.5 mmol/L (ref 3.5–5.1)
Sodium: 139 mmol/L (ref 135–145)

## 2022-07-10 LAB — MAGNESIUM: Magnesium: 2 mg/dL (ref 1.7–2.4)

## 2022-07-10 MED ORDER — SODIUM CHLORIDE (PF) 0.9 % IJ SOLN
INTRAMUSCULAR | Status: AC
  Administered 2022-07-10: 10 mL
  Filled 2022-07-10: qty 50

## 2022-07-10 MED ORDER — IOHEXOL 9 MG/ML PO SOLN
ORAL | Status: AC
  Administered 2022-07-10: 500 mL
  Filled 2022-07-10: qty 1000

## 2022-07-10 MED ORDER — POTASSIUM CHLORIDE CRYS ER 20 MEQ PO TBCR
40.0000 meq | EXTENDED_RELEASE_TABLET | Freq: Once | ORAL | Status: AC
  Administered 2022-07-10: 40 meq via ORAL
  Filled 2022-07-10: qty 2

## 2022-07-10 NOTE — Plan of Care (Signed)
Discussed with patient plan of care the evening, pain management and medications with some teach back displayed.  Patient was emotional and upset about IV beeping and overall not being home yet.  Helped console.   Problem: Education: Goal: Knowledge of General Education information will improve Description: Including pain rating scale, medication(s)/side effects and non-pharmacologic comfort measures Outcome: Progressing

## 2022-07-10 NOTE — Progress Notes (Signed)
Mobility Specialist - Progress Note   07/10/22 1009  Mobility  Activity Ambulated independently in room  Activity Response Tolerated well  Distance Ambulated (ft) 15 ft  $Mobility charge 1 Mobility  Level of Assistance Independent  Assistive Device None  HOB Elevated/Bed Position Self regulated  Range of Motion/Exercises Active  Transport method Ambulatory  Mobility Referral Yes   Pt received in bed and agreeable to mobility. Pt did sit-to-stands & leg extensions in room. Pt to bed at EOS w/ husband in room & all necessities in reach.  [facility]

## 2022-07-10 NOTE — Progress Notes (Signed)
PROGRESS NOTE  Melissa Waters RSW:[id number] DOB: September 21, 1951   PCP: Daisy Floro, MD  Patient is from: Home.  Lives with husband.  Independently ambulates at baseline.  DOA: 07/05/2022 LOS: 5  Chief complaints Chief Complaint  Patient presents with   Abdominal Pain    COVID positive     Brief Narrative / Interim history: 71 year old F with PMH of osteoarthritis, hypertension, hyperlipidemia and tobacco use disorder presenting with progressive abdominal pain for about a day and a half and diarrhea with mucoid stool, and admitted for sigmoid diverticulitis with perforation and trace volume pneumoperitoneum.  Surgery recommended conservative care with IV antibiotics and bowel rest.   Of note, patient and husband tested positive for COVID-19 on 9/29.  Husband is asymptomatic.  Patient started Paxlovid that evening.  She has no respiratory symptoms.  GI symptoms improved but leukocytosis rising.  CT abdomen and pelvis ordered.  Remains on IV antibiotics. Likely home in the next 24 to 48 hours if CT reassuring and cleared by general surgery  Subjective: Seen and examined earlier this morning.  No major events overnight of this morning.  She has no pain, nausea or vomiting.  No fever either.  Tolerating full liquid diet.  Leukocytosis continues to trend up slowly.  CT abdomen and pelvis ordered.  Patient's husband at bedside.  Objective: Vitals:   07/09/22 1420 07/09/22 2144 07/10/22 0500 07/10/22 0510  BP: 124/81 134/80  (!) 125/90  Pulse: 69 72  70  Resp: 18 16  16   Temp: 98.5 F (36.9 C) 98.8 F (37.1 C)  97.8 F (36.6 C)  TempSrc: Oral Oral  Oral  SpO2: 95% 95%  97%  Weight:   48 kg   Height:        Examination:  GENERAL: No apparent distress.  Nontoxic. HEENT: MMM.  Vision and hearing grossly intact.  NECK: Supple.  No apparent JVD.  RESP:  No IWOB.  Fair aeration bilaterally. CVS:  RRR. Heart sounds normal.  ABD/GI/GU: BS+. Abd soft, NTND.  MSK/EXT:  Moves  extremities. No apparent deformity. No edema.  SKIN: no apparent skin lesion or wound NEURO: Awake and alert. Oriented appropriately.  No apparent focal neuro deficit. PSYCH: Calm. Normal affect.   Procedures:  None  Microbiology summarized: None  Assessment and plan: Principal Problem:   Pneumoperitoneum Active Problems:   Diverticulitis of colon with perforation   Essential hypertension   Tobacco abuse   Hyperlipidemia   COVID-19 virus infection   Hyponatremia  Acute sigmoid diverticulitis with microperforation and trace pneumoperitoneum: Symptoms basically resolved.  Abdominal exam benign.  Tolerating FLD.  However, leukocytosis trended up slowly.  -Surgery following-repeat CT abdomen and pelvis ordered. -On full liquid diet. -Continue ceftriaxone and Flagyl. -Needs colonoscopy once infection and inflammation heals.  COVID-19 infection: Tested positive on 9/29.  Started on 10/29.  No respiratory symptoms. -Completed 5 days of Lagverio on 10/4. -Continue isolation precaution for 10 days from 9/29 per hospital protocol  Essential hypertension: Normotensive for most part. -Continue home Coreg -Avapro instead of home Diovan.  Hyponatremia/hypomagnesemia/hypophosphatemia:  -Monitor and replenish as appropriate  Hyperlipidemia: -Resume Zetia once diarrhea improves  Tobacco use disorder: Quit smoking about a week ago.   Leukocytosis: Trended up. -CT abdomen and pelvis ordered. -Continue monitoring  Physical deconditioning -PT/OT eval-no need identified.  Body mass index is 21.37 kg/m.           DVT prophylaxis:  heparin injection 5,000 Units Start: 07/06/22 0630 SCDs Start: 07/06/22 09/05/22  Code Status: Full code Family Communication: Updated patient's husband at bedside Level of care: Med-Surg Status is: Inpatient Remains inpatient appropriate because: Acute diverticulitis with perforation and pneumoperitoneum   Final disposition: Likely home once  medically stable Consultants:  General surgery  Sch Meds:  Scheduled Meds:  carvedilol  3.125 mg Oral BID WC   ezetimibe  10 mg Oral Daily   heparin  5,000 Units Subcutaneous Q8H   iohexol       irbesartan  75 mg Oral Daily   polyethylene glycol  17 g Oral Daily   saccharomyces boulardii  250 mg Oral BID   sodium chloride (PF)       Continuous Infusions:  sodium chloride 75 mL/hr at 07/10/22 0325   cefTRIAXone (ROCEPHIN)  IV Stopped (07/09/22 2035)   metronidazole 500 mg (07/10/22 0816)   PRN Meds:.acetaminophen **OR** acetaminophen, HYDROmorphone (DILAUDID) injection, iohexol, oxyCODONE, sodium chloride (PF)  Antimicrobials: Anti-infectives (From admission, onward)    Start     Dose/Rate Route Frequency Ordered Stop   07/08/22 2000  cefTRIAXone (ROCEPHIN) 2 g in sodium chloride 0.9 % 100 mL IVPB        2 g 200 mL/hr over 30 Minutes Intravenous Every 24 hours 07/08/22 1204     07/06/22 1400  molnupiravir EUA (LAGEVRIO) capsule 800 mg       Note to Pharmacy: Pt may take home meds   4 capsule Oral 2 times daily 07/06/22 0624 07/07/22 1100   07/06/22 0800  metroNIDAZOLE (FLAGYL) IVPB 500 mg        500 mg 100 mL/hr over 60 Minutes Intravenous Every 12 hours 07/06/22 0434     07/06/22 0600  ciprofloxacin (CIPRO) IVPB 400 mg  Status:  Discontinued        400 mg 200 mL/hr over 60 Minutes Intravenous Every 12 hours 07/06/22 0434 07/08/22 1203   07/05/22 2100  **Patient Supplied*Molnupiravir EUA (LAGEVRIO) capsule 800 mg        4 capsule Oral  Once 07/05/22 2046 07/05/22 2100   07/05/22 1800  ciprofloxacin (CIPRO) IVPB 400 mg        400 mg 200 mL/hr over 60 Minutes Intravenous  Once 07/05/22 1756 07/05/22 2033   07/05/22 1800  metroNIDAZOLE (FLAGYL) IVPB 500 mg        500 mg 100 mL/hr over 60 Minutes Intravenous  Once 07/05/22 1756 07/05/22 2104        I have personally reviewed the following labs and images: CBC: Recent Labs  Lab 07/06/22 0625 07/07/22 0451  07/08/22 0450 07/09/22 0437 07/10/22 0456  WBC 10.7* 10.2 12.2* 14.3* 15.5*  NEUTROABS 8.6*  --   --   --  11.7*  HGB 12.1 11.9* 11.8* 11.9* 11.1*  HCT 35.5* 35.4* 35.1* 35.2* 33.6*  MCV 89.6 92.4 92.1 91.4 92.6  PLT 249 294 356 441* 462*   BMP &GFR Recent Labs  Lab 07/05/22 1411 07/06/22 0625 07/07/22 0451 07/08/22 0450 07/10/22 0456  NA 129* 132* 136 135 139  K 4.2 3.8 3.4* 4.1 3.5  CL 93* 102 107 105 106  CO2 24 21* 20* 22 27  GLUCOSE 188* 125* 91 136* 114*  BUN 9 7* 5* <5* <5*  CREATININE 0.47 0.32* <0.30* <0.30* <0.30*  CALCIUM 9.6 8.8* 8.4* 8.5* 8.3*  MG  --  2.0 2.0 1.6* 2.0  PHOS  --   --  2.2* 2.3* 3.6   CrCl cannot be calculated (This lab value cannot be used to calculate CrCl because it  is not a number: <0.30). Liver & Pancreas: Recent Labs  Lab 07/05/22 1411 07/06/22 0625 07/07/22 0451 07/08/22 0450 07/10/22 0456  AST 41 24  --   --   --   ALT 41 31  --   --   --   ALKPHOS 132* 121  --   --   --   BILITOT 0.9 1.2  --   --   --   PROT 7.0 6.2*  --   --   --   ALBUMIN 4.1 3.1* 2.8* 2.9* 2.7*   Recent Labs  Lab 07/05/22 1411  LIPASE 17   No results for input(s): "AMMONIA" in the last 168 hours. Diabetic: No results for input(s): "HGBA1C" in the last 72 hours. No results for input(s): "GLUCAP" in the last 168 hours. Cardiac Enzymes: No results for input(s): "CKTOTAL", "CKMB", "CKMBINDEX", "TROPONINI" in the last 168 hours. No results for input(s): "PROBNP" in the last 8760 hours. Coagulation Profile: No results for input(s): "INR", "PROTIME" in the last 168 hours. Thyroid Function Tests: No results for input(s): "TSH", "T4TOTAL", "FREET4", "T3FREE", "THYROIDAB" in the last 72 hours.  Lipid Profile: No results for input(s): "CHOL", "HDL", "LDLCALC", "TRIG", "CHOLHDL", "LDLDIRECT" in the last 72 hours. Anemia Panel: No results for input(s): "VITAMINB12", "FOLATE", "FERRITIN", "TIBC", "IRON", "RETICCTPCT" in the last 72 hours. Urine analysis:     Component Value Date/Time   COLORURINE YELLOW 07/05/2022 1901   APPEARANCEUR CLEAR 07/05/2022 1901   LABSPEC <1.005 (L) 07/05/2022 1901   PHURINE 7.5 07/05/2022 1901   GLUCOSEU NEGATIVE 07/05/2022 1901   HGBUR TRACE (A) 07/05/2022 1901   BILIRUBINUR NEGATIVE 07/05/2022 1901   KETONESUR 40 (A) 07/05/2022 1901   PROTEINUR 30 (A) 07/05/2022 1901   UROBILINOGEN 0.2 05/21/2009 1437   NITRITE NEGATIVE 07/05/2022 1901   LEUKOCYTESUR NEGATIVE 07/05/2022 1901   Sepsis Labs: Invalid input(s): "PROCALCITONIN", "LACTICIDVEN"  Microbiology: No results found for this or any previous visit (from the past 240 hour(s)).  Radiology Studies: No results found.    Corynne Scibilia T. Khair Chasteen [facility]  If 7PM-7AM, please contact night-coverage [email or web address] 07/10/2022, 12:37 PM

## 2022-07-10 NOTE — Progress Notes (Signed)
    Assessment & Plan: HD#6 - Acute sigmoid diverticulitis with microperforation - CT 10/2 shows colonic diverticulosis with perforated acute sigmoid diverticulitis and trace volume pneumoperitoneum - WBC continues to rise at 15.5K despite clinical improvement in symptoms - will proceed with CT abd today per Dr. Ron Parker plan  ID - cipro/flagyl FEN - IVF, FLD VTE - sq heparin Foley - none   COVID-19 infection HTN HLD Tobacco abuse        Armandina Gemma, MD [facility] [place] practice Office: [phone]        Chief Complaint: Covid, perforated sigmoid diverticulitis  Subjective: Patient comfortable in bed, no complaints.  Loose BM's.  Objective: Vital signs in last 24 hours: Temp:  [97.8 F (36.6 C)-98.8 F (37.1 C)] 97.8 F (36.6 C) (10/07 0510) Pulse Rate:  [69-72] 70 (10/07 0510) Resp:  [16-18] 16 (10/07 0510) BP: (124-134)/(80-90) 125/90 (10/07 0510) SpO2:  [95 %-97 %] 97 % (10/07 0510) Weight:  [48 kg] 48 kg (10/07 0500) Last BM Date : 07/09/22  Intake/Output from previous day: 10/06 0701 - 10/07 0700 In: 2385 [P.O.:1560; I.V.:525; IV Piggyback:300] Out: 0  Intake/Output this shift: No intake/output data recorded.  Physical Exam: HEENT - sclerae clear, mucous membranes moist Abdomen - soft without distension; no mass; non-tender Ext - no edema, non-tender Neuro - alert & oriented, no focal deficits  Lab Results:  Recent Labs    07/09/22 0437 07/10/22 0456  WBC 14.3* 15.5*  HGB 11.9* 11.1*  HCT 35.2* 33.6*  PLT 441* 462*   BMET Recent Labs    07/08/22 0450 07/10/22 0456  NA 135 139  K 4.1 3.5  CL 105 106  CO2 22 27  GLUCOSE 136* 114*  BUN <5* <5*  CREATININE <0.30* <0.30*  CALCIUM 8.5* 8.3*   PT/INR No results for input(s): "LABPROT", "INR" in the last 72 hours. Comprehensive Metabolic Panel:    Component Value Date/Time   NA 139 07/10/2022 0456   NA 135 07/08/2022 0450   K 3.5 07/10/2022 0456   K 4.1  07/08/2022 0450   CL 106 07/10/2022 0456   CL 105 07/08/2022 0450   CO2 27 07/10/2022 0456   CO2 22 07/08/2022 0450   BUN <5 (L) 07/10/2022 0456   BUN <5 (L) 07/08/2022 0450   CREATININE <0.30 (L) 07/10/2022 0456   CREATININE <0.30 (L) 07/08/2022 0450   GLUCOSE 114 (H) 07/10/2022 0456   GLUCOSE 136 (H) 07/08/2022 0450   CALCIUM 8.3 (L) 07/10/2022 0456   CALCIUM 8.5 (L) 07/08/2022 0450   AST 24 07/06/2022 0625   AST 41 07/05/2022 1411   ALT 31 07/06/2022 0625   ALT 41 07/05/2022 1411   ALKPHOS 121 07/06/2022 0625   ALKPHOS 132 (H) 07/05/2022 1411   BILITOT 1.2 07/06/2022 0625   BILITOT 0.9 07/05/2022 1411   BILITOT 0.3 02/20/2020 0829   PROT 6.2 (L) 07/06/2022 0625   PROT 7.0 07/05/2022 1411   PROT 6.2 02/20/2020 0829   ALBUMIN 2.7 (L) 07/10/2022 0456   ALBUMIN 2.9 (L) 07/08/2022 0450   ALBUMIN 4.4 02/20/2020 0829    Studies/Results: No results found.    Armandina Gemma 07/10/2022   Patient ID: Melissa Waters, female   DOB: April 20, 1951, 71 y.o.   MRN: [id number]

## 2022-07-11 LAB — RENAL FUNCTION PANEL
Albumin: 2.6 g/dL — ABNORMAL LOW (ref 3.5–5.0)
Anion gap: 8 (ref 5–15)
BUN: <5 mg/dL — ABNORMAL LOW (ref 8–23)
CO2: 25 mmol/L (ref 22–32)
Calcium: 8.3 mg/dL — ABNORMAL LOW (ref 8.9–10.3)
Chloride: 108 mmol/L (ref 98–111)
Creatinine, Ser: <0.3 mg/dL — ABNORMAL LOW (ref 0.44–1.00)
Glucose, Bld: 104 mg/dL — ABNORMAL HIGH (ref 70–99)
Phosphorus: 3.3 mg/dL (ref 2.5–4.6)
Potassium: 3.6 mmol/L (ref 3.5–5.1)
Sodium: 141 mmol/L (ref 135–145)

## 2022-07-11 LAB — CBC
HCT: 33.5 % — ABNORMAL LOW (ref 36.0–46.0)
Hemoglobin: 11.1 g/dL — ABNORMAL LOW (ref 12.0–15.0)
MCH: 30.5 pg (ref 26.0–34.0)
MCHC: 33.1 g/dL (ref 30.0–36.0)
MCV: 92 fL (ref 80.0–100.0)
Platelets: 495 10*3/uL — ABNORMAL HIGH (ref 150–400)
RBC: 3.64 MIL/uL — ABNORMAL LOW (ref 3.87–5.11)
RDW: 12.7 % (ref 11.5–15.5)
WBC: 7.7 10*3/uL (ref 4.0–10.5)
nRBC: 0 % (ref 0.0–0.2)

## 2022-07-11 LAB — MAGNESIUM: Magnesium: 2.1 mg/dL (ref 1.7–2.4)

## 2022-07-11 MED ORDER — SULFAMETHOXAZOLE-TRIMETHOPRIM 800-160 MG PO TABS: 1.0000 | ORAL_TABLET | Freq: Two times a day (BID) | ORAL | 0 refills | Status: AC

## 2022-07-11 MED ORDER — METRONIDAZOLE 500 MG PO TABS: 500.0000 mg | ORAL_TABLET | Freq: Two times a day (BID) | ORAL | 0 refills | Status: AC

## 2022-07-11 NOTE — Progress Notes (Signed)
Mobility Specialist - Progress Note   07/11/22 1050  Mobility  Activity Ambulated independently in room  Activity Response Tolerated well  Distance Ambulated (ft) 20 ft  $Mobility charge 1 Mobility  Level of Assistance Independent  Assistive Device None  HOB Elevated/Bed Position Self regulated  Range of Motion/Exercises Active  Transport method Ambulatory  Mobility Referral Yes   Pt received in bathroom and agreeable to mobility. Assisted pt w/ theraband exercises during mobility. Pt to bed after session with all needs met.     [facility]

## 2022-07-11 NOTE — Plan of Care (Signed)

## 2022-07-11 NOTE — Discharge Summary (Incomplete)
Physician Discharge Summary  Melissa Waters [id number] DOB: [birth year] DOA: 07/05/2022  PCP: [name], MD  Admit date: 07/05/2022 Discharge date: 07/11/2022  Admitted From: Home Disposition:  home  Recommendations for Outpatient Follow-up:  Follow up with PCP in 1-2 weeks Please obtain BMP/CBC in one week Please follow up on the following pending results:  Home Health: No Equipment/Devices: None  Discharge Condition: Good CODE STATUS: Full code Diet recommendation: Heart healthy  Brief/Interim Summary: 71 year old F with PMH of osteoarthritis, hypertension, hyperlipidemia and tobacco use disorder presenting with progressive abdominal pain for about a day and a half and diarrhea with mucoid stool, and admitted for sigmoid diverticulitis with perforation and trace volume pneumoperitoneum.  Surgery recommended conservative care with IV antibiotics and bowel rest.    Of note, patient and husband tested positive for COVID-19 on 9/29.  Husband is asymptomatic.  Patient started Paxlovid that evening.  She has no respiratory symptoms.   GI symptoms improved but leukocytosis rising.  CT abdomen and pelvis ordered and appeared improved.  Remains on IV antibiotics.  WBC had dropped by the next morning, was tolerating regular diet, abdominal pain had resolved.  It was felt she was stable for discharge.  Discharge Diagnoses:  Principal Problem:   Pneumoperitoneum Active Problems:   Essential hypertension   Tobacco abuse   Hyperlipidemia   Diverticulitis of colon with perforation   COVID-19 virus infection   Hyponatremia  Acute sigmoid diverticulitis with microperforation and trace pneumoperitoneum: Symptoms basically resolved.  Abdominal exam benign.  Tolerating FLD.  However, leukocytosis trended up slowly.  -Surgery following-repeat CT abdomen and pelvis ordered. -On full liquid diet. -Continue ceftriaxone and Flagyl.  Discharged home on Septra and Flagyl -Needs  colonoscopy once infection and inflammation heals.   COVID-19 infection: Tested positive on 9/29.  Started on [employer].  No respiratory symptoms. -Completed 5 days of Lagverio on 10/4. -Continue isolation precaution for 10 days from 9/29 per hospital protocol   Essential hypertension: Normotensive for most part. -Continue home Coreg -Resume home Diovan.   Hyponatremia/hypomagnesemia/hypophosphatemia:  -Monitor and replenish as appropriate   Hyperlipidemia: -Resume Zetia once diarrhea improves   Tobacco use disorder: Quit smoking about a week ago.    Leukocytosis: Trended up. -CT abdomen and pelvis ordered. -Continue monitoring   Physical deconditioning -PT/OT eval-no need identified.  Discharge Instructions:  Discharge Instructions     Call MD for:  persistant nausea and vomiting   Complete by: As directed    Call MD for:  severe uncontrolled pain   Complete by: As directed    Call MD for:  temperature >100.4   Complete by: As directed    Diet - low sodium heart healthy   Complete by: As directed    Increase activity slowly   Complete by: As directed       Allergies as of 07/11/2022       Reactions   Amoxil [amoxicillin] Diarrhea, Nausea And Vomiting   Metformin Other (See Comments)   constipation   Pravastatin Other (See Comments)   Leg pain. Depression   Rosuvastatin Other (See Comments)   Memory problems        Medication List     TAKE these medications    acetaminophen 500 MG tablet Commonly known as: TYLENOL Take 1,000 mg by mouth daily as needed (pain).   ascorbic acid 500 MG tablet Commonly known as: VITAMIN C Take 500 mg by mouth daily.   aspirin EC 81 MG tablet Take 1 tablet (81  mg total) by mouth daily.   BIOTIN PO Take 1 tablet by mouth daily.   carvedilol 3.125 MG tablet Commonly known as: COREG Take 1 tablet (3.125 mg total) by mouth 2 (two) times daily with a meal.   ezetimibe 10 MG tablet Commonly known as: ZETIA Take 1  tablet (10 mg total) by mouth daily.   isosorbide mononitrate 30 MG 24 hr tablet Commonly known as: IMDUR Take 1 tablet (30 mg total) by mouth daily.   Lagevrio 200 MG Caps capsule Generic drug: molnupiravir EUA Take 4 capsules by mouth 2 (two) times daily.   LUMIFY OP Place 1 drop into both eyes in the morning and at bedtime.   meloxicam 15 MG tablet Commonly known as: MOBIC Take 15 mg by mouth daily.   metoprolol tartrate 100 MG tablet Commonly known as: Lopressor Take 1 tablet (100 mg total) by mouth once for 1 dose. Take 90-120 minutes prior to scan.   metroNIDAZOLE 500 MG tablet Commonly known as: FLAGYL Take 1 tablet (500 mg total) by mouth 2 (two) times daily for 9 days.   MILK THISTLE PO Take 350 mg by mouth daily.   nitroGLYCERIN 0.4 MG SL tablet Commonly known as: NITROSTAT Place 1 tablet (0.4 mg total) under the tongue every 5 (five) minutes as needed for chest pain.   PHILLIPS COLON HEALTH PO Take 1 capsule by mouth daily.   sulfamethoxazole-trimethoprim 800-160 MG tablet Commonly known as: BACTRIM DS Take 1 tablet by mouth 2 (two) times daily for 9 days.   valsartan 160 MG tablet Commonly known as: DIOVAN Take 160 mg by mouth daily.   varenicline 1 MG tablet Commonly known as: CHANTIX Take by mouth.   Vitamin D3 1000 units Caps Take 1,000 Units by mouth daily.        Follow-up Information     [facility] [place] Follow up.   Specialty: General Surgery Contact information: [address] [place] [address] [phone]         [name], MD Follow up.   Specialty: Family Medicine Contact information: [place] [address] [phone]         Dorothy Spark, MD .   Specialty: Cardiology Contact information: [place] [address] [phone]                Allergies  Allergen Reactions   Amoxil [Amoxicillin] Diarrhea and Nausea And  Vomiting   Metformin Other (See Comments)    constipation   Pravastatin Other (See Comments)    Leg pain. Depression   Rosuvastatin Other (See Comments)    Memory problems    Consultations: General surgery   Procedures/Studies: CT ABDOMEN PELVIS WO CONTRAST  Result Date: 07/10/2022 CLINICAL DATA:  Acute diverticulitis, complication suspected EXAM: CT ABDOMEN AND PELVIS WITHOUT CONTRAST TECHNIQUE: Multidetector CT imaging of the abdomen and pelvis was performed following the standard protocol without IV contrast. Patient drank dilute oral contrast for exam. RADIATION DOSE REDUCTION: This exam was performed according to the departmental dose-optimization program which includes automated exposure control, adjustment of the mA and/or kV according to patient size and/or use of iterative reconstruction technique. COMPARISON:  07/05/2022 FINDINGS: Lower chest: Subsegmental atelectasis at both lung bases Hepatobiliary: Contracted gallbladder.  Liver normal appearance. Pancreas: Normal appearance Spleen: Normal appearance Adrenals/Urinary Tract: Adrenal glands normal appearance. Kidneys and visualized ureters normal appearance. Distal ureters and bladder obscured by beam hardening artifacts in pelvis. Stomach/Bowel:  Normal appendix. Stomach decompressed. Small bowel loops unremarkable. GI contrast in colon. Diffuse wall thickening of the sigmoid colon with pericolic inflammatory changes at the distal sigmoid colon consistent with acute diverticulitis. Edema and presacral space. No definite abscess or extraluminal gas identified, though assessment of portions of the pelvis is limited by beam hardening artifacts. Remaining bowel loops unremarkable. Vascular/Lymphatic: Atherosclerotic calcifications aorta and iliac arteries. Coronary arterial calcifications. Reproductive: Nonvisualization of uterus and ovaries Other: No free air or free fluid. Specifically, no definite free intraperitoneal air identified as was  seen on the recent prior study. No hernia. Musculoskeletal: Osseous demineralization. Extensive beam hardening artifacts in pelvis from BILATERAL hip prostheses. Tarlov cysts in sacral spinal canal. Pronounced levoconvex thoracolumbar scoliosis. IMPRESSION: Acute sigmoid diverticulitis without evidence of abscess or extraluminal gas. This exam is significantly limited by beam hardening artifacts in pelvis from BILATERAL hip prostheses. Subsegmental atelectasis at both lung bases. Pronounced levoconvex thoracolumbar scoliosis with osseous demineralization and Tarlov cysts in sacral spinal canal. Aortic Atherosclerosis (ICD10-I70.0). Electronically Signed   By: Lavonia Dana M.D.   On: 07/10/2022 15:40   CT Abdomen Pelvis W Contrast  Addendum Date: 07/05/2022   ADDENDUM REPORT: 07/05/2022 17:46 ADDENDUM: These results were called by telephone at the time of interpretation on 07/05/2022 at 5:45 pm to provider Dr. Amador Cunas, who verbally acknowledged these results. Electronically Signed   By: Iven Finn M.D.   On: 07/05/2022 17:46   Result Date: 07/05/2022 CLINICAL DATA:  Abdominal pain, acute, nonlocalized. Pt to ED known COVID positive, tested positive on Friday, taking Lagevrio as prescribed. Here today for increased weakness, abdominal pain, diarrhea. EXAM: CT ABDOMEN AND PELVIS WITH CONTRAST TECHNIQUE: Multidetector CT imaging of the abdomen and pelvis was performed using the standard protocol following bolus administration of intravenous contrast. RADIATION DOSE REDUCTION: This exam was performed according to the departmental dose-optimization program which includes automated exposure control, adjustment of the mA and/or kV according to patient size and/or use of iterative reconstruction technique. CONTRAST:  21mL OMNIPAQUE IOHEXOL 300 MG/ML  SOLN COMPARISON:  CT chest 05/06/2022, CT hip 06/21/2013 FINDINGS: Lower chest: No acute abnormality.  Coronary artery calcification. Hepatobiliary: No focal liver  abnormality. No gallstones, gallbladder wall thickening, or pericholecystic fluid. No biliary dilatation. Pancreas: No focal lesion. Normal pancreatic contour. No surrounding inflammatory changes. No main pancreatic ductal dilatation. Spleen: Normal in size without focal abnormality. Adrenals/Urinary Tract: No adrenal nodule bilaterally. Bilateral kidneys enhance symmetrically. Subcentimeter hypodensities are too small to characterize. No hydronephrosis. No hydroureter. The urinary bladder is unremarkable. Stomach/Bowel: Stomach is within normal limits. No evidence of small bowel wall thickening or dilatation. Colonic diverticulosis. Bowel wall thickening and pericolonic fat stranding of the mid sigmoid colon. Marked inflammatory changes surrounding the mid sigmoid colon within the pelvis. No definite intramural abscess formation. Appendix appears normal. Vascular/Lymphatic: No abdominal aorta or iliac aneurysm. Mild atherosclerotic plaque of the aorta and its branches. No abdominal, pelvic, or inguinal lymphadenopathy. Reproductive: Status post hysterectomy. No adnexal masses. Other: No intraperitoneal free fluid. Trace volume free intraperitoneal gas. No organized fluid collection. Musculoskeletal: No abdominal wall hernia or abnormality. No suspicious lytic or blastic osseous lesions. No acute displaced fracture. Levoscoliosis centered at the L2 with associated multilevel degenerative changes of the spine. Bilateral total hip arthroplasty. 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 IMPRESSION: 1. Colonic diverticulosis with perforated acute sigmoid diverticulitis. Trace volume pneumoperitoneum. Recommend colonoscopy status post treatment and status post complete resolution of inflammatory changes to exclude an underlying lesion. 2. Aortic Atherosclerosis (  ICD10-I70.0) including coronary artery calcification. Electronically Signed: By: Iven Finn M.D. On: 07/05/2022 17:42      Subjective:   Discharge  Exam: Vitals:   07/10/22 2132 07/11/22 0555  BP: (!) 148/105 (!) 146/86  Pulse: 73 67  Resp: 12 18  Temp: 98 F (36.7 C) 98.1 F (36.7 C)  SpO2: 95% 99%   Vitals:   07/10/22 1810 07/10/22 2132 07/11/22 0500 07/11/22 0555  BP: (!) 136/90 (!) 148/105  (!) 146/86  Pulse: 70 73  67  Resp: 18 12  18   Temp: 97.7 F (36.5 C) 98 F (36.7 C)  98.1 F (36.7 C)  TempSrc: Oral Oral  Oral  SpO2: 96% 95%  99%  Weight:   48.9 kg   Height:        General: Pt is alert, awake, not in acute distress Cardiovascular: RRR, S1/S2 +, no rubs, no gallops Respiratory: CTA bilaterally, no wheezing, no rhonchi Abdominal: Soft, NT, ND, bowel sounds + Extremities: no edema, no cyanosis    The results of significant diagnostics from this hospitalization (including imaging, microbiology, ancillary and laboratory) are listed below for reference.     Labs:  Basic Metabolic Panel: Recent Labs  Lab 07/06/22 0625 07/07/22 0451 07/08/22 0450 07/10/22 0456 07/11/22 0522  NA 132* 136 135 139 141  K 3.8 3.4* 4.1 3.5 3.6  CL 102 107 105 106 108  CO2 21* 20* 22 27 25   GLUCOSE 125* 91 136* 114* 104*  BUN 7* 5* <5* <5* <5*  CREATININE 0.32* <0.30* <0.30* <0.30* <0.30*  CALCIUM 8.8* 8.4* 8.5* 8.3* 8.3*  MG 2.0 2.0 1.6* 2.0 2.1  PHOS  --  2.2* 2.3* 3.6 3.3   Liver Function Tests: Recent Labs  Lab 07/05/22 1411 07/06/22 0625 07/07/22 0451 07/08/22 0450 07/10/22 0456 07/11/22 0522  AST 41 24  --   --   --   --   ALT 41 31  --   --   --   --   ALKPHOS 132* 121  --   --   --   --   BILITOT 0.9 1.2  --   --   --   --   PROT 7.0 6.2*  --   --   --   --   ALBUMIN 4.1 3.1* 2.8* 2.9* 2.7* 2.6*   Recent Labs  Lab 07/05/22 1411  LIPASE 17    CBC: Recent Labs  Lab 07/06/22 0625 07/07/22 0451 07/08/22 0450 07/09/22 0437 07/10/22 0456 07/11/22 0522  WBC 10.7* 10.2 12.2* 14.3* 15.5* 7.7  NEUTROABS 8.6*  --   --   --  11.7*  --   HGB 12.1 11.9* 11.8* 11.9* 11.1* 11.1*  HCT 35.5* 35.4*  35.1* 35.2* 33.6* 33.5*  MCV 89.6 92.4 92.1 91.4 92.6 92.0  PLT 249 294 356 441* 462* 495*    Urinalysis    Component Value Date/Time   COLORURINE YELLOW 07/05/2022 1901   APPEARANCEUR CLEAR 07/05/2022 1901   LABSPEC <1.005 (L) 07/05/2022 1901   PHURINE 7.5 07/05/2022 1901   GLUCOSEU NEGATIVE 07/05/2022 1901   HGBUR TRACE (A) 07/05/2022 1901   BILIRUBINUR NEGATIVE 07/05/2022 1901   KETONESUR 40 (A) 07/05/2022 1901   PROTEINUR 30 (A) 07/05/2022 1901   UROBILINOGEN 0.2 05/21/2009 1437   NITRITE NEGATIVE 07/05/2022 1901   LEUKOCYTESUR NEGATIVE 07/05/2022 1901   Sepsis Labs Recent Labs  Lab 07/08/22 0450 07/09/22 0437 07/10/22 0456 07/11/22 0522  WBC 12.2* 14.3* 15.5* 7.7    Time  coordinating discharge: Over 30 minutes  SIGNED:   Donnamae Jude, MD  [facility] 07/11/2022, 10:52 AM  If 7PM-7AM, please contact night-coverage

## 2022-07-11 NOTE — Progress Notes (Signed)
Assessment unchanged. Pt and husband verbalized understanding of dc instructions including medications and follow up care. Discharged via wc to front entrance by NT.

## 2022-07-11 NOTE — Progress Notes (Addendum)
Assessment & Plan: HD#7 - Acute sigmoid diverticulitis with microperforation - CT 10/2 shows colonic diverticulosis with perforated acute sigmoid diverticulitis and trace volume pneumoperitoneum; repeat CT 10/7 with no abscess or free air present, persistent inflammatory changes - WBC 7.7 this AM - advance to soft diet   ID - cipro/flagyl FEN - IVF, soft diet VTE - sq heparin Foley - none   COVID-19 infection HTN HLD Tobacco use          Darnell Level, MD [facility] practice Office: [phone]        Chief Complaint: Diverticular disease  Subjective: Patient up in bed, no complaints.  Tolerating liquids.  Objective: Vital signs in last 24 hours: Temp:  [97.7 F (36.5 C)-98.1 F (36.7 C)] 98.1 F (36.7 C) (10/08 0555) Pulse Rate:  [63-73] 67 (10/08 0555) Resp:  [12-18] 18 (10/08 0555) BP: (136-148)/(86-105) 146/86 (10/08 0555) SpO2:  [95 %-99 %] 99 % (10/08 0555) Weight:  [48.9 kg] 48.9 kg (10/08 0500) Last BM Date : 07/09/22  Intake/Output from previous day: 10/07 0701 - 10/08 0700 In: 810 [P.O.:600; I.V.:10; IV Piggyback:200] Out: -  Intake/Output this shift: No intake/output data recorded.  Physical Exam: HEENT - sclerae clear, mucous membranes moist Neck - soft Abdomen - soft without distension; non-tender; no mass or guarding Ext - no edema, non-tender Neuro - alert & oriented, no focal deficits  Lab Results:  Recent Labs    07/10/22 0456 07/11/22 0522  WBC 15.5* 7.7  HGB 11.1* 11.1*  HCT 33.6* 33.5*  PLT 462* 495*   BMET Recent Labs    07/10/22 0456 07/11/22 0522  NA 139 141  K 3.5 3.6  CL 106 108  CO2 27 25  GLUCOSE 114* 104*  BUN <5* <5*  CREATININE <0.30* <0.30*  CALCIUM 8.3* 8.3*   PT/INR No results for input(s): "LABPROT", "INR" in the last 72 hours. Comprehensive Metabolic Panel:    Component Value Date/Time   NA 141 07/11/2022 0522   NA 139 07/10/2022 0456   K 3.6 07/11/2022 0522   K  3.5 07/10/2022 0456   CL 108 07/11/2022 0522   CL 106 07/10/2022 0456   CO2 25 07/11/2022 0522   CO2 27 07/10/2022 0456   BUN <5 (L) 07/11/2022 0522   BUN <5 (L) 07/10/2022 0456   CREATININE <0.30 (L) 07/11/2022 0522   CREATININE <0.30 (L) 07/10/2022 0456   GLUCOSE 104 (H) 07/11/2022 0522   GLUCOSE 114 (H) 07/10/2022 0456   CALCIUM 8.3 (L) 07/11/2022 0522   CALCIUM 8.3 (L) 07/10/2022 0456   AST 24 07/06/2022 0625   AST 41 07/05/2022 1411   ALT 31 07/06/2022 0625   ALT 41 07/05/2022 1411   ALKPHOS 121 07/06/2022 0625   ALKPHOS 132 (H) 07/05/2022 1411   BILITOT 1.2 07/06/2022 0625   BILITOT 0.9 07/05/2022 1411   BILITOT 0.3 02/20/2020 0829   PROT 6.2 (L) 07/06/2022 0625   PROT 7.0 07/05/2022 1411   PROT 6.2 02/20/2020 0829   ALBUMIN 2.6 (L) 07/11/2022 0522   ALBUMIN 2.7 (L) 07/10/2022 0456   ALBUMIN 4.4 02/20/2020 0829    Studies/Results: CT ABDOMEN PELVIS WO CONTRAST  Result Date: 07/10/2022 CLINICAL DATA:  Acute diverticulitis, complication suspected EXAM: CT ABDOMEN AND PELVIS WITHOUT CONTRAST TECHNIQUE: Multidetector CT imaging of the abdomen and pelvis was performed following the standard protocol without IV contrast. Patient drank dilute oral contrast for exam. RADIATION DOSE REDUCTION: This exam was performed according to the departmental  dose-optimization program which includes automated exposure control, adjustment of the mA and/or kV according to patient size and/or use of iterative reconstruction technique. COMPARISON:  07/05/2022 FINDINGS: Lower chest: Subsegmental atelectasis at both lung bases Hepatobiliary: Contracted gallbladder.  Liver normal appearance. Pancreas: Normal appearance Spleen: Normal appearance Adrenals/Urinary Tract: Adrenal glands normal appearance. Kidneys and visualized ureters normal appearance. Distal ureters and bladder obscured by beam hardening artifacts in pelvis. Stomach/Bowel: Normal appendix. Stomach decompressed. Small bowel loops  unremarkable. GI contrast in colon. Diffuse wall thickening of the sigmoid colon with pericolic inflammatory changes at the distal sigmoid colon consistent with acute diverticulitis. Edema and presacral space. No definite abscess or extraluminal gas identified, though assessment of portions of the pelvis is limited by beam hardening artifacts. Remaining bowel loops unremarkable. Vascular/Lymphatic: Atherosclerotic calcifications aorta and iliac arteries. Coronary arterial calcifications. Reproductive: Nonvisualization of uterus and ovaries Other: No free air or free fluid. Specifically, no definite free intraperitoneal air identified as was seen on the recent prior study. No hernia. Musculoskeletal: Osseous demineralization. Extensive beam hardening artifacts in pelvis from BILATERAL hip prostheses. Tarlov cysts in sacral spinal canal. Pronounced levoconvex thoracolumbar scoliosis. IMPRESSION: Acute sigmoid diverticulitis without evidence of abscess or extraluminal gas. This exam is significantly limited by beam hardening artifacts in pelvis from BILATERAL hip prostheses. Subsegmental atelectasis at both lung bases. Pronounced levoconvex thoracolumbar scoliosis with osseous demineralization and Tarlov cysts in sacral spinal canal. Aortic Atherosclerosis (ICD10-I70.0). Electronically Signed   By: Lavonia Dana M.D.   On: 07/10/2022 15:40      Armandina Gemma 07/11/2022   Patient ID: Marguarite Arbour, female   DOB: 04-09-1951, 71 y.o.   MRN: [id number]

## 2022-07-14 ENCOUNTER — Other Ambulatory Visit: Payer: Self-pay

## 2022-07-14 MED ORDER — ISOSORBIDE MONONITRATE ER 30 MG PO TB24: 30.0000 mg | ORAL_TABLET | Freq: Every day | ORAL | 2 refills | Status: DC

## 2022-07-22 DIAGNOSIS — K5792 Diverticulitis of intestine, part unspecified, without perforation or abscess without bleeding: Secondary | ICD-10-CM | POA: Diagnosis not present

## 2022-07-22 DIAGNOSIS — G720 Drug-induced myopathy: Secondary | ICD-10-CM | POA: Diagnosis not present

## 2022-07-22 DIAGNOSIS — E46 Unspecified protein-calorie malnutrition: Secondary | ICD-10-CM | POA: Diagnosis not present

## 2022-07-22 DIAGNOSIS — M2550 Pain in unspecified joint: Secondary | ICD-10-CM | POA: Diagnosis not present

## 2022-07-22 DIAGNOSIS — U071 COVID-19: Secondary | ICD-10-CM | POA: Diagnosis not present

## 2022-07-22 DIAGNOSIS — E1169 Type 2 diabetes mellitus with other specified complication: Secondary | ICD-10-CM | POA: Diagnosis not present

## 2022-07-22 DIAGNOSIS — I959 Hypotension, unspecified: Secondary | ICD-10-CM | POA: Diagnosis not present

## 2022-07-22 DIAGNOSIS — K668 Other specified disorders of peritoneum: Secondary | ICD-10-CM | POA: Diagnosis not present

## 2022-07-22 DIAGNOSIS — Z09 Encounter for follow-up examination after completed treatment for conditions other than malignant neoplasm: Secondary | ICD-10-CM | POA: Diagnosis not present

## 2022-07-22 DIAGNOSIS — I10 Essential (primary) hypertension: Secondary | ICD-10-CM | POA: Diagnosis not present

## 2022-08-13 DIAGNOSIS — E782 Mixed hyperlipidemia: Secondary | ICD-10-CM | POA: Diagnosis not present

## 2022-08-13 DIAGNOSIS — E559 Vitamin D deficiency, unspecified: Secondary | ICD-10-CM | POA: Diagnosis not present

## 2022-08-13 DIAGNOSIS — E1169 Type 2 diabetes mellitus with other specified complication: Secondary | ICD-10-CM | POA: Diagnosis not present

## 2022-08-13 DIAGNOSIS — I10 Essential (primary) hypertension: Secondary | ICD-10-CM | POA: Diagnosis not present

## 2022-08-20 DIAGNOSIS — Z0000 Encounter for general adult medical examination without abnormal findings: Secondary | ICD-10-CM | POA: Diagnosis not present

## 2022-08-20 DIAGNOSIS — J449 Chronic obstructive pulmonary disease, unspecified: Secondary | ICD-10-CM | POA: Diagnosis not present

## 2022-08-20 DIAGNOSIS — I10 Essential (primary) hypertension: Secondary | ICD-10-CM | POA: Diagnosis not present

## 2022-08-20 DIAGNOSIS — Z6820 Body mass index (BMI) 20.0-20.9, adult: Secondary | ICD-10-CM | POA: Diagnosis not present

## 2022-08-20 DIAGNOSIS — E1169 Type 2 diabetes mellitus with other specified complication: Secondary | ICD-10-CM | POA: Diagnosis not present

## 2022-08-20 DIAGNOSIS — E782 Mixed hyperlipidemia: Secondary | ICD-10-CM | POA: Diagnosis not present

## 2022-08-20 DIAGNOSIS — G720 Drug-induced myopathy: Secondary | ICD-10-CM | POA: Diagnosis not present

## 2022-08-20 DIAGNOSIS — E559 Vitamin D deficiency, unspecified: Secondary | ICD-10-CM | POA: Diagnosis not present

## 2022-10-05 ENCOUNTER — Other Ambulatory Visit: Payer: Self-pay | Admitting: *Deleted

## 2022-10-05 DIAGNOSIS — M47819 Spondylosis without myelopathy or radiculopathy, site unspecified: Secondary | ICD-10-CM | POA: Diagnosis not present

## 2022-10-05 DIAGNOSIS — M1991 Primary osteoarthritis, unspecified site: Secondary | ICD-10-CM | POA: Diagnosis not present

## 2022-10-05 DIAGNOSIS — M79642 Pain in left hand: Secondary | ICD-10-CM | POA: Diagnosis not present

## 2022-10-05 DIAGNOSIS — Z6820 Body mass index (BMI) 20.0-20.9, adult: Secondary | ICD-10-CM | POA: Diagnosis not present

## 2022-10-05 DIAGNOSIS — M25579 Pain in unspecified ankle and joints of unspecified foot: Secondary | ICD-10-CM | POA: Diagnosis not present

## 2022-10-05 MED ORDER — EZETIMIBE 10 MG PO TABS: 10.0000 mg | ORAL_TABLET | Freq: Every day | ORAL | 3 refills | Status: DC

## 2022-11-03 ENCOUNTER — Emergency Department (HOSPITAL_COMMUNITY): Payer: Medicare Other

## 2022-11-03 ENCOUNTER — Encounter (HOSPITAL_COMMUNITY): Payer: Self-pay | Admitting: *Deleted

## 2022-11-03 ENCOUNTER — Emergency Department (HOSPITAL_COMMUNITY): Payer: Medicare Other | Admitting: Certified Registered Nurse Anesthetist

## 2022-11-03 ENCOUNTER — Emergency Department (HOSPITAL_BASED_OUTPATIENT_CLINIC_OR_DEPARTMENT_OTHER): Payer: Medicare Other | Admitting: Certified Registered Nurse Anesthetist

## 2022-11-03 ENCOUNTER — Other Ambulatory Visit: Payer: Self-pay

## 2022-11-03 ENCOUNTER — Ambulatory Visit (HOSPITAL_COMMUNITY)
Admission: EM | Admit: 2022-11-03 | Discharge: 2022-11-03 | Disposition: A | Discharge status: Home / Self Care | Payer: Medicare Other | Attending: Emergency Medicine | Admitting: Emergency Medicine

## 2022-11-03 ENCOUNTER — Encounter (HOSPITAL_COMMUNITY)
Admission: EM | Disposition: A | Discharge status: Home / Self Care | Payer: Self-pay | Source: Home / Self Care | Attending: Emergency Medicine

## 2022-11-03 DIAGNOSIS — E119 Type 2 diabetes mellitus without complications: Secondary | ICD-10-CM | POA: Insufficient documentation

## 2022-11-03 DIAGNOSIS — Z79899 Other long term (current) drug therapy: Secondary | ICD-10-CM | POA: Diagnosis not present

## 2022-11-03 DIAGNOSIS — Z96642 Presence of left artificial hip joint: Secondary | ICD-10-CM | POA: Diagnosis not present

## 2022-11-03 DIAGNOSIS — S73004A Unspecified dislocation of right hip, initial encounter: Secondary | ICD-10-CM

## 2022-11-03 DIAGNOSIS — R011 Cardiac murmur, unspecified: Secondary | ICD-10-CM | POA: Diagnosis not present

## 2022-11-03 DIAGNOSIS — X501XXA Overexertion from prolonged static or awkward postures, initial encounter: Secondary | ICD-10-CM | POA: Insufficient documentation

## 2022-11-03 DIAGNOSIS — S73034A Other anterior dislocation of right hip, initial encounter: Secondary | ICD-10-CM | POA: Diagnosis not present

## 2022-11-03 DIAGNOSIS — F17210 Nicotine dependence, cigarettes, uncomplicated: Secondary | ICD-10-CM | POA: Diagnosis not present

## 2022-11-03 DIAGNOSIS — Z96641 Presence of right artificial hip joint: Secondary | ICD-10-CM | POA: Diagnosis not present

## 2022-11-03 DIAGNOSIS — M1990 Unspecified osteoarthritis, unspecified site: Secondary | ICD-10-CM | POA: Diagnosis not present

## 2022-11-03 DIAGNOSIS — T84020A Dislocation of internal right hip prosthesis, initial encounter: Secondary | ICD-10-CM | POA: Diagnosis not present

## 2022-11-03 DIAGNOSIS — I10 Essential (primary) hypertension: Secondary | ICD-10-CM | POA: Diagnosis not present

## 2022-11-03 DIAGNOSIS — J449 Chronic obstructive pulmonary disease, unspecified: Secondary | ICD-10-CM | POA: Diagnosis not present

## 2022-11-03 DIAGNOSIS — Z8249 Family history of ischemic heart disease and other diseases of the circulatory system: Secondary | ICD-10-CM | POA: Insufficient documentation

## 2022-11-03 DIAGNOSIS — M1612 Unilateral primary osteoarthritis, left hip: Secondary | ICD-10-CM | POA: Diagnosis not present

## 2022-11-03 DIAGNOSIS — N059 Unspecified nephritic syndrome with unspecified morphologic changes: Secondary | ICD-10-CM | POA: Diagnosis not present

## 2022-11-03 DIAGNOSIS — R0902 Hypoxemia: Secondary | ICD-10-CM | POA: Diagnosis not present

## 2022-11-03 DIAGNOSIS — R6889 Other general symptoms and signs: Secondary | ICD-10-CM | POA: Diagnosis not present

## 2022-11-03 DIAGNOSIS — M170 Bilateral primary osteoarthritis of knee: Secondary | ICD-10-CM | POA: Diagnosis not present

## 2022-11-03 DIAGNOSIS — Z743 Need for continuous supervision: Secondary | ICD-10-CM | POA: Diagnosis not present

## 2022-11-03 HISTORY — PX: HIP CLOSED REDUCTION: SHX983

## 2022-11-03 LAB — CBC
HCT: 40.1 % (ref 36.0–46.0)
Hemoglobin: 13.5 g/dL (ref 12.0–15.0)
MCH: 31.5 pg (ref 26.0–34.0)
MCHC: 33.7 g/dL (ref 30.0–36.0)
MCV: 93.7 fL (ref 80.0–100.0)
Platelets: 251 10*3/uL (ref 150–400)
RBC: 4.28 MIL/uL (ref 3.87–5.11)
RDW: 12.6 % (ref 11.5–15.5)
WBC: 12.1 10*3/uL — ABNORMAL HIGH (ref 4.0–10.5)
nRBC: 0 % (ref 0.0–0.2)

## 2022-11-03 LAB — HEMOGLOBIN A1C
Hgb A1c MFr Bld: 6.6 % — ABNORMAL HIGH (ref 4.8–5.6)
Mean Plasma Glucose: 142.72 mg/dL

## 2022-11-03 LAB — BASIC METABOLIC PANEL
Anion gap: 8 (ref 5–15)
BUN: 12 mg/dL (ref 8–23)
CO2: 26 mmol/L (ref 22–32)
Calcium: 8.8 mg/dL — ABNORMAL LOW (ref 8.9–10.3)
Chloride: 102 mmol/L (ref 98–111)
Creatinine, Ser: 0.38 mg/dL — ABNORMAL LOW (ref 0.44–1.00)
GFR, Estimated: >60 mL/min (ref >60)
Glucose, Bld: 127 mg/dL — ABNORMAL HIGH (ref 70–99)
Potassium: 3.9 mmol/L (ref 3.5–5.1)
Sodium: 136 mmol/L (ref 135–145)

## 2022-11-03 LAB — TYPE AND SCREEN
ABO/RH(D): O POS
Antibody Screen: NEGATIVE

## 2022-11-03 LAB — GLUCOSE, CAPILLARY: Glucose-Capillary: 105 mg/dL — ABNORMAL HIGH (ref 70–99)

## 2022-11-03 SURGERY — CLOSED MANIPULATION, JOINT, HIP
Anesthesia: General | Site: Hip

## 2022-11-03 MED ORDER — HYDROMORPHONE HCL 1 MG/ML IJ SOLN
0.5000 mg | Freq: Once | INTRAMUSCULAR | Status: AC
  Administered 2022-11-03: 0.5 mg via INTRAVENOUS
  Filled 2022-11-03: qty 1

## 2022-11-03 MED ORDER — PROPOFOL 10 MG/ML IV BOLUS
INTRAVENOUS | Status: AC
  Filled 2022-11-03: qty 20

## 2022-11-03 MED ORDER — LACTATED RINGERS IV SOLN: INTRAVENOUS | Status: DC

## 2022-11-03 MED ORDER — LACTATED RINGERS IV BOLUS
1000.0000 mL | Freq: Once | INTRAVENOUS | Status: AC
  Administered 2022-11-03: 1000 mL via INTRAVENOUS

## 2022-11-03 MED ORDER — CHLORHEXIDINE GLUCONATE 0.12 % MT SOLN
15.0000 mL | Freq: Once | OROMUCOSAL | Status: AC
  Administered 2022-11-03: 15 mL via OROMUCOSAL

## 2022-11-03 MED ORDER — ORAL CARE MOUTH RINSE: 15.0000 mL | Freq: Once | OROMUCOSAL | Status: AC

## 2022-11-03 MED ORDER — ACETAMINOPHEN 10 MG/ML IV SOLN: 1000.0000 mg | Freq: Once | INTRAVENOUS | Status: DC | PRN

## 2022-11-03 MED ORDER — CHLORHEXIDINE GLUCONATE 4 % EX LIQD: 60.0000 mL | Freq: Once | CUTANEOUS | Status: DC

## 2022-11-03 MED ORDER — FENTANYL CITRATE PF 50 MCG/ML IJ SOSY: 25.0000 ug | PREFILLED_SYRINGE | INTRAMUSCULAR | Status: DC | PRN

## 2022-11-03 MED ORDER — PROPOFOL 10 MG/ML IV BOLUS
INTRAVENOUS | Status: AC | PRN
  Administered 2022-11-03 (×3): 20 mg via INTRAVENOUS
  Administered 2022-11-03 (×2): 30 mg via INTRAVENOUS

## 2022-11-03 MED ORDER — SUCCINYLCHOLINE CHLORIDE 200 MG/10ML IV SOSY
PREFILLED_SYRINGE | INTRAVENOUS | Status: DC | PRN
  Administered 2022-11-03: 40 mg via INTRAVENOUS

## 2022-11-03 MED ORDER — ONDANSETRON HCL 4 MG/2ML IJ SOLN
INTRAMUSCULAR | Status: AC
  Filled 2022-11-03: qty 2

## 2022-11-03 MED ORDER — PROPOFOL 10 MG/ML IV BOLUS
50.0000 mg | Freq: Once | INTRAVENOUS | Status: AC
  Administered 2022-11-03: 60 mg via INTRAVENOUS
  Filled 2022-11-03: qty 20

## 2022-11-03 MED ORDER — PROPOFOL 10 MG/ML IV BOLUS
INTRAVENOUS | Status: AC | PRN
  Administered 2022-11-03: 50 mg via INTRAVENOUS
  Administered 2022-11-03: 20 mg via INTRAVENOUS

## 2022-11-03 MED ORDER — POVIDONE-IODINE 10 % EX SWAB
2.0000 | Freq: Once | CUTANEOUS | Status: AC
  Administered 2022-11-03: 2 via TOPICAL

## 2022-11-03 MED ORDER — FENTANYL CITRATE PF 50 MCG/ML IJ SOSY
25.0000 ug | PREFILLED_SYRINGE | Freq: Once | INTRAMUSCULAR | Status: AC
  Administered 2022-11-03: 25 ug via INTRAVENOUS
  Filled 2022-11-03: qty 1

## 2022-11-03 MED ORDER — PROPOFOL 10 MG/ML IV BOLUS
1.0000 mg/kg | Freq: Once | INTRAVENOUS | Status: DC
  Filled 2022-11-03: qty 20

## 2022-11-03 MED ORDER — FENTANYL CITRATE (PF) 100 MCG/2ML IJ SOLN
INTRAMUSCULAR | Status: AC
  Filled 2022-11-03: qty 2

## 2022-11-03 MED ORDER — TRAMADOL HCL 50 MG PO TABS: 50.0000 mg | ORAL_TABLET | Freq: Three times a day (TID) | ORAL | 0 refills | Status: DC | PRN

## 2022-11-03 MED ORDER — SUCCINYLCHOLINE CHLORIDE 200 MG/10ML IV SOSY
PREFILLED_SYRINGE | INTRAVENOUS | Status: AC
  Filled 2022-11-03: qty 10

## 2022-11-03 SURGICAL SUPPLY — 59 items
BAG COUNTER SPONGE SURGICOUNT (BAG) IMPLANT
BAG DECANTER FOR FLEXI CONT (MISCELLANEOUS) ×1 IMPLANT
BAG ZIPLOCK 12X15 (MISCELLANEOUS) ×1 IMPLANT
BLADE SAW SAG 25X90X1.19 (BLADE) ×1 IMPLANT
CHLORAPREP W/TINT 26 (MISCELLANEOUS) ×2 IMPLANT
COVER SURGICAL LIGHT HANDLE (MISCELLANEOUS) ×1 IMPLANT
DERMABOND ADVANCED .7 DNX12 (GAUZE/BANDAGES/DRESSINGS) ×1 IMPLANT
DRAPE HIP W/POCKET STRL (MISCELLANEOUS) ×1 IMPLANT
DRAPE INCISE IOBAN 66X45 STRL (DRAPES) ×1 IMPLANT
DRAPE INCISE IOBAN 85X60 (DRAPES) ×1 IMPLANT
DRAPE POUCH INSTRU U-SHP 10X18 (DRAPES) ×1 IMPLANT
DRAPE SHEET LG 3/4 BI-LAMINATE (DRAPES) ×3 IMPLANT
DRAPE SURG 17X11 SM STRL (DRAPES) ×1 IMPLANT
DRAPE U-SHAPE 47X51 STRL (DRAPES) ×2 IMPLANT
DRESSING AQUACEL AG SP 3.5X10 (GAUZE/BANDAGES/DRESSINGS) ×1 IMPLANT
DRSG AQUACEL AG SP 3.5X10 (GAUZE/BANDAGES/DRESSINGS)
ELECT BLADE TIP CTD 4 INCH (ELECTRODE) ×1 IMPLANT
ELECT REM PT RETURN 15FT ADLT (MISCELLANEOUS) ×1 IMPLANT
GLOVE BIO SURGEON STRL SZ 6.5 (GLOVE) ×2 IMPLANT
GLOVE BIOGEL PI IND STRL 6.5 (GLOVE) ×1 IMPLANT
GLOVE BIOGEL PI IND STRL 8 (GLOVE) ×1 IMPLANT
GLOVE SURG ORTHO 8.0 STRL STRW (GLOVE) ×2 IMPLANT
GOWN STRL REUS W/ TWL XL LVL3 (GOWN DISPOSABLE) ×2 IMPLANT
GOWN STRL REUS W/TWL XL LVL3 (GOWN DISPOSABLE)
HANDPIECE INTERPULSE COAX TIP (DISPOSABLE)
HOLDER FOLEY CATH W/STRAP (MISCELLANEOUS) ×1 IMPLANT
HOOD PEEL AWAY T7 (MISCELLANEOUS) ×3 IMPLANT
JET LAVAGE IRRISEPT WOUND (IRRIGATION / IRRIGATOR)
KIT BASIN OR (CUSTOM PROCEDURE TRAY) ×1 IMPLANT
KIT TURNOVER KIT A (KITS) IMPLANT
LAVAGE JET IRRISEPT WOUND (IRRIGATION / IRRIGATOR) IMPLANT
MANIFOLD NEPTUNE II (INSTRUMENTS) ×1 IMPLANT
MARKER SKIN DUAL TIP RULER LAB (MISCELLANEOUS) ×1 IMPLANT
NEEDLE HYPO 22GX1.5 SAFETY (NEEDLE) IMPLANT
NS IRRIG 1000ML POUR BTL (IV SOLUTION) ×1 IMPLANT
PACK TOTAL JOINT (CUSTOM PROCEDURE TRAY) ×1 IMPLANT
PILLOW ABDUCTION MEDIUM (MISCELLANEOUS) ×1 IMPLANT
PRESSURIZER FEMORAL UNIV (MISCELLANEOUS) IMPLANT
PROTECTOR NERVE ULNAR (MISCELLANEOUS) ×1 IMPLANT
RETRIEVER SUT HEWSON (MISCELLANEOUS) ×1 IMPLANT
SEALER BIPOLAR AQUA 6.0 (INSTRUMENTS) ×1 IMPLANT
SET HNDPC FAN SPRY TIP SCT (DISPOSABLE) IMPLANT
SPIKE FLUID TRANSFER (MISCELLANEOUS) ×3 IMPLANT
SUCTION FRAZIER HANDLE 12FR (TUBING)
SUCTION TUBE FRAZIER 12FR DISP (TUBING) ×1 IMPLANT
SUT BONE WAX W31G (SUTURE) ×1 IMPLANT
SUT ETHIBOND #5 BRAIDED 30INL (SUTURE) ×1 IMPLANT
SUT MNCRL AB 3-0 PS2 18 (SUTURE) ×1 IMPLANT
SUT STRATAFIX 0 PDS 27 VIOLET (SUTURE)
SUT STRATAFIX PDO 1 14 VIOLET (SUTURE)
SUT STRATFX PDO 1 14 VIOLET (SUTURE)
SUT VIC AB 2-0 CT2 27 (SUTURE) ×2 IMPLANT
SUTURE STRATFX 0 PDS 27 VIOLET (SUTURE) ×1 IMPLANT
SUTURE STRATFX PDO 1 14 VIOLET (SUTURE) ×1 IMPLANT
SYR 20ML LL LF (SYRINGE) ×2 IMPLANT
TOWEL OR 17X26 10 PK STRL BLUE (TOWEL DISPOSABLE) ×1 IMPLANT
TRAY FOLEY MTR SLVR 16FR STAT (SET/KITS/TRAYS/PACK) ×1 IMPLANT
UNDERPAD 30X36 HEAVY ABSORB (UNDERPADS AND DIAPERS) ×1 IMPLANT
WATER STERILE IRR 1000ML POUR (IV SOLUTION) ×2 IMPLANT

## 2022-11-03 NOTE — Anesthesia Postprocedure Evaluation (Signed)
Anesthesia Post Note  Patient: Melissa Waters  Procedure(s) Performed: CLOSED MANIPULATION HIP (Hip)     Patient location during evaluation: PACU Anesthesia Type: General Level of consciousness: awake and alert Pain management: pain level controlled Vital Signs Assessment: post-procedure vital signs reviewed and stable Respiratory status: spontaneous breathing, nonlabored ventilation, respiratory function stable and patient connected to nasal cannula oxygen Cardiovascular status: blood pressure returned to baseline and stable Postop Assessment: no apparent nausea or vomiting Anesthetic complications: no   No notable events documented.  Last Vitals:  Vitals:   11/03/22 1715 11/03/22 1730  BP: (!) 149/98 (!) 159/84  Pulse: 87 78  Resp: (!) 21 16  Temp:    SpO2: 95% 96%    Last Pain:  Vitals:   11/03/22 1715  TempSrc:   PainSc: 0-No pain                 Belenda Cruise P Estephany Perot

## 2022-11-03 NOTE — Procedures (Signed)
Procedure: Right hip closed reduction   Indication: Right hip dislocation   Surgeon: Silvestre Gunner, PA-C   Assist: Sherwood Gambler   Anesthesia: Propofol via EDP   EBL: None   Complications: Unsuccessful   Findings: After risks/benefits explained patient desires to undergo procedure. Consent obtained and time out performed. Sedation given and confirmed. Multiple attempts with multiple methods were used to relocate hip; all were unsuccessful. Pt tolerated the procedure well.       Lisette Abu, PA-C Orthopedic Surgery [phone]

## 2022-11-03 NOTE — Op Note (Signed)
11/03/2022  5:17 PM  PATIENT:  Marguarite Arbour    PRE-OPERATIVE DIAGNOSIS: Posterior dislocation right total hip arthroplasty  POST-OPERATIVE DIAGNOSIS:  Same  PROCEDURE: Closed manipulation right hip dislocation  SURGEON:  Trenden Hazelrigg A Gaetan Spieker, MD  PHYSICIAN ASSISTANT: none  ANESTHESIA:   General  PREOPERATIVE INDICATIONS:  SKY PRIMO is a  72 y.o. female who underwent right total hip arthroplasty with Dr. Mardelle Matte 12 years ago.  She had did fine after surgery.  In 2020 she had an injury where she had which resulted in dislocation of her right hip requiring closed reduction.  She had done fine since 2020 with no issues.  This morning she was flexing forward on the toilet to pet her cat when she had sudden pain in the right hip.  She came to the emergency room demonstrating a posterior dislocation.  2 attempts at closed reduction done in the emergency room were unsuccessful.  She was brought to the operating room for closed versus open reduction..    The risks benefits and alternatives were discussed with the patient preoperatively including but not limited to the risks of infection, bleeding, nerve injury, cardiopulmonary complications, the need for revision surgery, among others, and the patient was willing to proceed.  ESTIMATED BLOOD LOSS: 0cc  OPERATIVE IMPLANTS: none  OPERATIVE FINDINGS: posterior hip dislocation, successfully reduced with closed manipulation  OPERATIVE PROCEDURE:  Patient was brought to the operating room.  She was kept supine on the stretcher.  Anesthesia including relaxation with succinylcholine was provided.  When she was adequately sedated pressure was held on the pelvis and the right leg was flexed internally rotated and adducted.  A clunk was appreciated as the hip reduced.  Leg lengths were assessed and felt to be equal.  IntraOp flatplate confirmed reduction of the hip replacement.  A hip abduction pillow was placed between the legs.  The patient was  awoken from sedation and taken to the PACU in stable condition.  Post op recs: WB: WBAT, posterior hip precautions x6weeks Abx: none Imaging: none Dressing: none DVT prophylaxis: not indicated Follow up: 4-6 weeks with Dr. Zachery Dakins or Dr. Mardelle Matte at [facility].  Address: [address] [place], [place], [place] [address]  Office Phone: [phone]

## 2022-11-03 NOTE — OR Nursing (Signed)
Patient ambulated to bathroom with one assist, steady but slow gait noted. Able to void. Ambulated more steady to bay D for discharge.  Phase II nurse made aware.

## 2022-11-03 NOTE — ED Notes (Signed)
Please call husband Edd Arbour if patient is transferred prior to his return

## 2022-11-03 NOTE — ED Provider Notes (Signed)
[place] EMERGENCY DEPARTMENT AT [facility] Provider Note   CSN: [id number] Arrival date & time: 11/03/22  8242     History  Chief Complaint  Patient presents with   Hip Pain    Melissa Waters is a 72 y.o. female.  HPI 72 year old female with a history of bilateral hip arthroplasty and a previous history of right hip dislocation presents with a right hip dislocation.  She was on the toilet and her dog came into her bathroom and she was bending over little to pet his head and felt her hip dislocate.  She has been having some severe pain but EMS gave her fentanyl and so right now she is feeling okay.  No weakness or numbness in the extremity. Last ate last night (~10 pm).  Home Medications Prior to Admission medications   Medication Sig Start Date End Date Taking? Authorizing Provider  acetaminophen (TYLENOL) 500 MG tablet Take 1,000 mg by mouth daily as needed (pain).   Yes [provider]  BIOTIN PO Take 10,000 mcg by mouth daily.   Yes [provider]  Brimonidine Tartrate (LUMIFY OP) Place 1 drop into both eyes daily.   Yes [provider]  carvedilol (COREG) 3.125 MG tablet Take 1 tablet (3.125 mg total) by mouth 2 (two) times daily with a meal. 04/29/22  Yes Pemberton, Greer Ee, MD  Cholecalciferol (VITAMIN D3) 1000 units CAPS Take 1,000 Units by mouth daily.   Yes [provider]  ezetimibe (ZETIA) 10 MG tablet Take 1 tablet (10 mg total) by mouth daily. 10/05/22  Yes Freada Bergeron, MD  isosorbide mononitrate (IMDUR) 30 MG 24 hr tablet Take 1 tablet (30 mg total) by mouth daily. 07/14/22  Yes Freada Bergeron, MD  meloxicam (MOBIC) 15 MG tablet Take 15 mg by mouth daily.   Yes [provider]  MILK THISTLE PO Take 350 mg by mouth daily.   Yes [provider]  nitroGLYCERIN (NITROSTAT) 0.4 MG SL tablet Place 1 tablet (0.4 mg total) under the tongue every 5 (five) minutes as needed for chest pain. 12/05/20   Yes Freada Bergeron, MD  Probiotic Product ([place]) Take 1 capsule by mouth daily.   Yes [provider]  valsartan (DIOVAN) 160 MG tablet Take 160 mg by mouth daily.   Yes [provider]  vitamin C (ASCORBIC ACID) 500 MG tablet Take 500 mg by mouth daily.   Yes [provider]      Allergies    Amoxil [amoxicillin], Metformin, Pravastatin, and Rosuvastatin    Review of Systems   Review of Systems  Musculoskeletal:  Positive for arthralgias.  Neurological:  Negative for weakness and numbness.    Physical Exam Updated Vital Signs BP (!) 152/94   Pulse 73   Temp 98.2 F (36.8 C) (Oral)   Resp 15   Ht 4\' 11"  (1.499 m)   Wt 48.5 kg   SpO2 99%   BMI 21.61 kg/m  Physical Exam Vitals and nursing note reviewed.  Constitutional:      Appearance: She is well-developed.  HENT:     Head: Normocephalic and atraumatic.  Cardiovascular:     Rate and Rhythm: Normal rate and regular rhythm.     Pulses:          Posterior tibial pulses are 2+ on the right side.  Pulmonary:     Effort: Pulmonary effort is normal.  Abdominal:     General: There is  no distension.  Musculoskeletal:     Right hip: Deformity present.     Comments: Grossly normal sensation in right foot  Skin:    General: Skin is warm and dry.  Neurological:     Mental Status: She is alert.     ED Results / Procedures / Treatments   Labs (all labs ordered are listed, but only abnormal results are displayed) Labs Reviewed - No data to display  EKG None  Radiology DG Hip [place] W or [place] Pelvis 1 View Right  Result Date: 11/03/2022 CLINICAL DATA:  Status post reduction. EXAM: DG HIP (WITH OR WITHOUT PELVIS) 1V PORT RIGHT COMPARISON:  Same day. FINDINGS: Continued presence of dislocation involving right hip arthroplasty. No fracture is noted. IMPRESSION: Continued presence of dislocation involving right hip arthroplasty. Electronically Signed   By: Marijo Conception M.D.   On: 11/03/2022 09:58   DG Hip Unilat W or Wo Pelvis 2-3 Views Right  Result Date: 11/03/2022 CLINICAL DATA:  Right hip dislocation. EXAM: DG HIP (WITH OR WITHOUT PELVIS) 2-3V RIGHT COMPARISON:  July 10, 2022. FINDINGS: Patient is status post bilateral total hip arthroplasties. There appears to be superior and lateral dislocation of the right femoral component; it cannot be determined if it is anterior or posterior based on the submitted images. IMPRESSION: Dislocation of right hip arthroplasty is noted. Electronically Signed   By: Marijo Conception M.D.   On: 11/03/2022 08:07    Procedures .Sedation  Date/Time: 11/03/2022 9:30 AM  Performed by: Sherwood Gambler, MD Authorized by: Sherwood Gambler, MD   Consent:    Consent obtained:  Verbal and written   Consent given by:  Patient Universal protocol:    Immediately prior to procedure, a time out was called: yes     Patient identity confirmed:  Verbally with patient Indications:    Procedure performed:  Dislocation reduction   Procedure necessitating sedation performed by:  Physician performing sedation Pre-sedation assessment:    Time since last food or drink:  10 hours   ASA classification: class 2 - patient with mild systemic disease     Mallampati score:  II - soft palate, uvula, fauces visible   Pre-sedation assessments completed and reviewed: airway patency, cardiovascular function, hydration status, mental status, nausea/vomiting, pain level, respiratory function and temperature   Immediate pre-procedure details:    Reassessment: Patient reassessed immediately prior to procedure     Reviewed: vital signs     Verified: bag valve mask available, emergency equipment available, intubation equipment available, IV patency confirmed, oxygen available and suction available   Procedure details (see MAR for exact dosages):    Preoxygenation:  Nasal cannula   Sedation:  Propofol   Intended level of sedation: deep   Intra-procedure  monitoring:  Blood pressure monitoring, cardiac monitor, continuous pulse oximetry, continuous capnometry, frequent LOC assessments and frequent vital sign checks   Intra-procedure events: none     Total Provider sedation time (minutes):  12 Post-procedure details:    Attendance: Constant attendance by certified staff until patient recovered     Recovery: Patient returned to pre-procedure baseline     Patient is stable for discharge or admission: yes     Procedure completion:  Tolerated well, no immediate complications Reduction of dislocation  Date/Time: 11/03/2022 10:02 AM  Performed by: Sherwood Gambler, MD Authorized by: Sherwood Gambler, MD  Consent: Verbal consent obtained. Written consent obtained. Risks and benefits: risks, benefits and alternatives were discussed Consent given by: patient Time  out: Immediately prior to procedure a "time out" was called to verify the correct patient, procedure, equipment, support staff and site/side marked as required. Preparation: Patient was prepped and draped in the usual sterile fashion.  Sedation: Patient sedated: yes  Patient tolerance: patient tolerated the procedure well with no immediate complications Comments: Failed attempts   .Sedation  Date/Time: 11/03/2022 11:05 AM  Performed by: Pricilla Loveless, MD Authorized by: Pricilla Loveless, MD   Consent:    Consent obtained:  Verbal and written   Consent given by:  Patient Universal protocol:    Immediately prior to procedure, a time out was called: yes     Patient identity confirmed:  Verbally with patient Indications:    Procedure performed:  Dislocation reduction   Procedure necessitating sedation performed by:  Different physician Pre-sedation assessment:    Time since last food or drink:  13 hours   ASA classification: class 2 - patient with mild systemic disease     Mallampati score:  II - soft palate, uvula, fauces visible   Pre-sedation assessments completed and reviewed:  airway patency, cardiovascular function, hydration status, mental status, nausea/vomiting, pain level, respiratory function and temperature   Immediate pre-procedure details:    Reviewed: vital signs, relevant labs/tests and NPO status     Verified: bag valve mask available, emergency equipment available, intubation equipment available, IV patency confirmed, reversal medications available and suction available   Procedure details (see MAR for exact dosages):    Preoxygenation:  Nasal cannula   Sedation:  Propofol   Intended level of sedation: deep   Intra-procedure monitoring:  Blood pressure monitoring, cardiac monitor, continuous capnometry, continuous pulse oximetry, frequent vital sign checks and frequent LOC assessments   Intra-procedure management:  Airway repositioning   Total Provider sedation time (minutes):  10 Post-procedure details:    Attendance: Constant attendance by certified staff until patient recovered     Recovery: Patient returned to pre-procedure baseline     Post-sedation assessments completed and reviewed: airway patency, cardiovascular function, hydration status, mental status, nausea/vomiting, pain level, respiratory function and temperature     Patient is stable for discharge or admission: yes     Procedure completion:  Tolerated well, no immediate complications Comments:     Repeat sedation with Earney Hamburg performing reduction     Medications Ordered in ED Medications  propofol (DIPRIVAN) 10 mg/mL bolus/IV push 48.5 mg (48.5 mg Intravenous See Procedure Record 11/03/22 1111)  propofol (DIPRIVAN) 10 mg/mL bolus/IV push 50 mg (50 mg Intravenous See Procedure Record 11/03/22 1111)  lactated ringers bolus 1,000 mL (0 mLs Intravenous Stopped 11/03/22 1112)  fentaNYL (SUBLIMAZE) injection 25 mcg (25 mcg Intravenous Given 11/03/22 0900)  HYDROmorphone (DILAUDID) injection 0.5 mg (0.5 mg Intravenous Given 11/03/22 0944)  propofol (DIPRIVAN) 10 mg/mL bolus/IV push (20  mg Intravenous Given 11/03/22 0919)  propofol (DIPRIVAN) 10 mg/mL bolus/IV push (20 mg Intravenous Given 11/03/22 1056)    ED Course/ Medical Decision Making/ A&P                             Medical Decision Making Amount and/or Complexity of Data Reviewed Radiology: ordered and independent interpretation performed.    Details: Right hip dislocation on xrays  Risk Prescription drug management.   Patient is neurovascular intact.  Unfortunately, despite what appeared to be adequate sedation with propofol I was unable to reduce her right hip.  Consulted orthopedics and Earney Hamburg came over to try again  and she was really sedated.  Unfortunately he cannot reduce her hip either and so she will have to go to the OR.  She otherwise remains hemodynamically stable and tolerated both sedations without difficulty.  She has been given some IV pain control in between as well.        Final Clinical Impression(s) / ED Diagnoses Final diagnoses:  Closed dislocation of right hip, initial encounter [facility])    Rx / DC Orders ED Discharge Orders     None         Sherwood Gambler, MD 11/03/22 1245

## 2022-11-03 NOTE — Transfer of Care (Signed)
Immediate Anesthesia Transfer of Care Note  Patient: Melissa Waters  Procedure(s) Performed: CLOSED MANIPULATION HIP (Hip)  Patient Location: PACU  Anesthesia Type:General  Level of Consciousness: sedated  Airway & Oxygen Therapy: Patient Spontanous Breathing and Patient connected to face mask oxygen  Post-op Assessment: Report given to RN and Post -op Vital signs reviewed and stable  Post vital signs: Reviewed and stable  Last Vitals:  Vitals Value Taken Time  BP 130/77 11/03/22 1706  Temp    Pulse 68 11/03/22 1707  Resp 10 11/03/22 1707  SpO2 100 % 11/03/22 1707  Vitals shown include unvalidated device data.  Last Pain:  Vitals:   11/03/22 1508  TempSrc:   PainSc: 2       Patients Stated Pain Goal: 0 (29/52/84 1324)  Complications: No notable events documented.

## 2022-11-03 NOTE — ED Notes (Signed)
SBAR given to [facility] in short stay.

## 2022-11-03 NOTE — ED Triage Notes (Signed)
Pt bib ems from home c/o right hip dislocation while sitting on the toilet. Hx right hip replacement 2010 145mcg Fentanyl en route

## 2022-11-03 NOTE — Discharge Instructions (Signed)
Orthopedic Discharge Instructions  Diet: As you were doing prior to hospitalization   Shower: No restrictions  Dressing: None.  Activity:  Increase activity slowly as tolerated, but follow the weight bearing instructions below.  The rules on driving is that you can not be taking narcotics while you drive, and you must feel in control of the vehicle.    Weight Bearing: Weightbearing as tolerated right lower extremity.  Follow-up posterior hip precautions for 6 weeks.  Use of abduction pillow when in bed.  Blood clot prevention (DVT Prophylaxis): not indicated   To prevent constipation: you may use a stool softener such as -  Colace (over the counter) 100 mg by mouth twice a day  Drink plenty of fluids (prune juice may be helpful) and high fiber foods Miralax (over the counter) for constipation as needed.    Itching:  If you experience itching with your medications, try taking only a single pain pill, or even half a pain pill at a time.  You may take up to 10 pain pills per day, and you can also use benadryl over the counter for itching or also to help with sleep.   Precautions:  If you experience chest pain or shortness of breath - call 911 immediately for transfer to the hospital emergency department!!   Call office [phone]) for the following: Temperature greater than 101F Persistent nausea and vomiting Severe uncontrolled pain Redness, tenderness, or signs of infection (pain, swelling, redness, odor or green/yellow discharge around the site) Difficulty breathing, headache or visual disturbances Hives Persistent dizziness or light-headedness Extreme fatigue Any other questions or concerns you may have after discharge  In an emergency, call 911 or go to an Emergency Department at a nearby hospital  Follow- Up Appointment:  Please call for an appointment to be seen approximately 4-6 week after surgery in [facility] with your surgeon Dr. Marchia Bond-  [phone] Address: [address] [place], [place], [place] [address]

## 2022-11-03 NOTE — Anesthesia Preprocedure Evaluation (Addendum)
Anesthesia Evaluation  Patient identified by MRN, date of birth, ID band Patient awake    Reviewed: NPO status , Patient's Chart, lab work & pertinent test results  Airway Mallampati: II  TM Distance: >3 FB Neck ROM: Full    Dental no notable dental hx.    Pulmonary COPD, Current Smoker and Patient abstained from smoking.   Pulmonary exam normal        Cardiovascular hypertension, Pt. on medications and Pt. on home beta blockers  Rhythm:Regular Rate:Normal     Neuro/Psych negative neurological ROS  negative psych ROS   GI/Hepatic negative GI ROS, Neg liver ROS,,,  Endo/Other  diabetes, Type 2    Renal/GU   negative genitourinary   Musculoskeletal  (+) Arthritis , Osteoarthritis,    Abdominal Normal abdominal exam  (+)   Peds  Hematology negative hematology ROS (+)   Anesthesia Other Findings   Reproductive/Obstetrics                             Anesthesia Physical Anesthesia Plan  ASA: 2  Anesthesia Plan: General   Post-op Pain Management:    Induction: Intravenous  PONV Risk Score and Plan: 2 and Ondansetron, Dexamethasone and Treatment may vary due to age or medical condition  Airway Management Planned: Mask  Additional Equipment: None  Intra-op Plan:   Post-operative Plan: Extubation in OR  Informed Consent: I have reviewed the patients History and Physical, chart, labs and discussed the procedure including the risks, benefits and alternatives for the proposed anesthesia with the patient or authorized representative who has indicated his/her understanding and acceptance.     Dental advisory given  Plan Discussed with: CRNA  Anesthesia Plan Comments: (Lab Results      Component                Value               Date                      WBC                      12.1 (H)            11/03/2022                HGB                      13.5                11/03/2022                 HCT                      40.1                11/03/2022                MCV                      93.7                11/03/2022                PLT                      251  11/03/2022           )       Anesthesia Quick Evaluation

## 2022-11-03 NOTE — OR Nursing (Signed)
Called [facility] to confirm if patient is to get PT prior to discharge.  Patient does not need PT nor knee immobilizer, states already spoke with patient's husband regarding hip precautions at home. Patient only needs to ambulate prior to discharge.

## 2022-11-03 NOTE — Consult Note (Signed)
Reason for Consult:Right hip dislocation Referring Physician: Sherwood Gambler Time called: [id number] Time at bedside: Melissa Waters is an 72 y.o. female.  HPI: Melissa Waters was on the toilet and bent down to pet her cat. She had immediate pain in her right hip and could not get up. She was brought to the ED where x-rays confirmed a right hip dislocation. CR was attempted by EDP without success and orthopedic surgery was consulted. CR attempted by myself without success as well.  Past Medical History:  Diagnosis Date   COPD (chronic obstructive pulmonary disease) ([place])    Diabetes mellitus without complication ([place])    no meds    Diverticulosis    Hx: of   Heart murmur    as a baby   Hypertension    Nephritis    Hx: of 1958   Osteoarthritis    right hip   Osteoarthritis of left hip 08/13/2013    Past Surgical History:  Procedure Laterality Date   COLONOSCOPY W/ POLYPECTOMY  2006   TOTAL HIP ARTHROPLASTY Right    05/27/2009   TOTAL HIP ARTHROPLASTY Left 08/13/2013   Procedure: TOTAL HIP ARTHROPLASTY;  Surgeon: Johnny Bridge, MD;  Location: [facility];  Service: Orthopedics;  Laterality: Left;    Family History  Problem Relation Age of Onset   Heart Problems Father 55       CABG   Hypertension Mother    Breast cancer Mother    Heart attack Other 54       fatal MI    Social History:  reports that she has been smoking cigarettes. She has a 61.25 pack-year smoking history. She has never used smokeless tobacco. She reports current alcohol use. She reports that she does not use drugs.  Allergies:  Allergies  Allergen Reactions   Amoxil [Amoxicillin] Diarrhea and Nausea And Vomiting   Metformin Other (See Comments)    constipation   Pravastatin Other (See Comments)    Leg pain. Depression   Rosuvastatin Other (See Comments)    Memory problems    Medications: I have reviewed the patient's current medications.  No results found for this or any previous visit (from the past 48  hour(s)).  DG Hip [place] W or [place] Pelvis 1 View Right  Result Date: 11/03/2022 CLINICAL DATA:  Status post reduction. EXAM: DG HIP (WITH OR WITHOUT PELVIS) 1V PORT RIGHT COMPARISON:  Same day. FINDINGS: Continued presence of dislocation involving right hip arthroplasty. No fracture is noted. IMPRESSION: Continued presence of dislocation involving right hip arthroplasty. Electronically Signed   By: Marijo Conception M.D.   On: 11/03/2022 09:58   DG Hip [place]il[place] W or Wo Pelvis 2-3 Views Right  Result Date: 11/03/2022 CLINICAL DATA:  Right hip dislocation. EXAM: DG HIP (WITH OR WITHOUT PELVIS) 2-3V RIGHT COMPARISON:  July 10, 2022. FINDINGS: Patient is status post bilateral total hip arthroplasties. There appears to be superior and lateral dislocation of the right femoral component; it cannot be determined if it is anterior or posterior based on the submitted images. IMPRESSION: Dislocation of right hip arthroplasty is noted. Electronically Signed   By: Marijo Conception M.D.   On: 11/03/2022 08:07    Review of Systems  HENT:  Negative for ear discharge, ear pain, hearing loss and tinnitus.   Eyes:  Negative for photophobia and pain.  Respiratory:  Negative for cough and shortness of breath.   Cardiovascular:  Negative for chest pain.  Gastrointestinal:  Negative  for abdominal pain, nausea and vomiting.  Genitourinary:  Negative for dysuria, flank pain, frequency and urgency.  Musculoskeletal:  Positive for arthralgias (Right hip). Negative for back pain, myalgias and neck pain.  Neurological:  Negative for dizziness and headaches.  Hematological:  Does not bruise/bleed easily.  Psychiatric/Behavioral:  The patient is not nervous/anxious.    Blood pressure (!) 150/81, pulse 74, temperature 98.2 F (36.8 C), temperature source Oral, resp. rate 17, height 4\' 11"  (1.499 m), weight 48.5 kg, SpO2 99 %. Physical Exam Constitutional:      General: She is not in acute distress.    Appearance: She is  well-developed. She is not diaphoretic.  HENT:     Head: Normocephalic and atraumatic.  Eyes:     General: No scleral icterus.       Right eye: No discharge.        Left eye: No discharge.     Conjunctiva/sclera: Conjunctivae normal.  Cardiovascular:     Rate and Rhythm: Normal rate and regular rhythm.  Pulmonary:     Effort: Pulmonary effort is normal. No respiratory distress.  Musculoskeletal:     Cervical back: Normal range of motion.     Comments: RLE No traumatic wounds, ecchymosis, or rash  Hip post/sup dislocation  No knee or ankle effusion  Knee stable to varus/ valgus and anterior/posterior stress  Sens DPN, SPN, TN intact  Motor EHL, ext, flex, evers 5/5  DP 2+, PT 2+, No significant edema  Skin:    General: Skin is warm and dry.  Neurological:     Mental Status: She is alert.  Psychiatric:        Mood and Affect: Mood normal.        Behavior: Behavior normal.     Assessment/Plan: Right hip dislocation -- Plan CR vs open reduction in OR with Dr. Zachery Dakins. Please keep NPO.    Lisette Abu, PA-C Orthopedic Surgery [phone] 11/03/2022, 11:09 AM

## 2022-11-04 ENCOUNTER — Encounter (HOSPITAL_COMMUNITY): Payer: Self-pay | Admitting: Orthopedic Surgery

## 2022-11-04 DIAGNOSIS — T84020A Dislocation of internal right hip prosthesis, initial encounter: Secondary | ICD-10-CM | POA: Diagnosis not present

## 2022-11-04 DIAGNOSIS — Z96641 Presence of right artificial hip joint: Secondary | ICD-10-CM | POA: Diagnosis not present

## 2022-11-16 DIAGNOSIS — Z6821 Body mass index (BMI) 21.0-21.9, adult: Secondary | ICD-10-CM | POA: Diagnosis not present

## 2022-11-18 NOTE — Progress Notes (Signed)
Office Visit    Patient Name: Melissa Waters Date of Encounter: 11/19/2022  PCP:  [name], MD   [facility]  Cardiologist:  Freada Bergeron, MD  Advanced Practice Provider:  No care team member to display Electrophysiologist:  None   HPI    Melissa Waters is a 72 y.o. female with a past medical history significant for hypertension, family history of CAD, tobacco abuse, coronary calcifications Presents today for follow-up appointment.  Saw Dr. Meda Coffee 04/2020.  She had been following in the lipid clinic for statin intolerance, currently on Zetia.  She was active and asymptomatic at that time.  Lexiscan Myoview 2016 with negative for ischemia.  Chest CT for lung cancer screening 01/2018 showed coronary atherosclerosis in the LAD.  Was seen last in the clinic 12/2020 where she was doing well from a CV standpoint.  She was last seen by Dr. Johney Frame 04/2022 and at that time she was feeling well.  States she had been needing to use nitro more often than previously due to chest pressure radiating to her back.  Symptoms resolved with 1 pill within a couple of minutes.  She can then resume her activity.  No significant shortness of breath.  Ambulation is limited due to foot pain.  No orthopnea, PND, or LE edema.  Continues to smoke about 4 to 6 cigarettes a day.  Today, she tells me that she has not had any issues with her heart.  No chest pain.  She has had problems with her foot for 20 years.  She has issues with hammertoes and calluses.  The right foot was having some swelling and she was started on Mobic with resolution.  However, over the last week she has had increased swelling on the right side with no apparent cause.  She is not getting much physical activity at the moment because she fears her hip will dislocate again.  October 24, 2022 at 5:30 AM she bent over to care for her cat and her hip popped out.  She had to go to the ER and get some pain medicine  and it was popped back in place.  Prior to that she was in the ER in October for diverticulitis.  She has had no further episodes.  She takes daily Benefiber and a stool softener as needed.   Reports no shortness of breath nor dyspnea on exertion. Reports no chest pain, pressure, or tightness. No edema, orthopnea, PND. Reports no palpitations.    Past Medical History    Past Medical History:  Diagnosis Date   COPD (chronic obstructive pulmonary disease) ([place])    Diabetes mellitus without complication ([place])    no meds    Diverticulosis    Hx: of   Heart murmur    as a baby   Hypertension    Nephritis    Hx: of 1958   Osteoarthritis    right hip   Osteoarthritis of left hip 08/13/2013   Past Surgical History:  Procedure Laterality Date   COLONOSCOPY W/ POLYPECTOMY  2006   HIP CLOSED REDUCTION  11/03/2022   Procedure: CLOSED MANIPULATION HIP;  Surgeon: Willaim Sheng, MD;  Location: [facility];  Service: Orthopedics;;   TOTAL HIP ARTHROPLASTY Right    05/27/2009   TOTAL HIP ARTHROPLASTY Left 08/13/2013   Procedure: TOTAL HIP ARTHROPLASTY;  Surgeon: Johnny Bridge, MD;  Location: [facility];  Service: Orthopedics;  Laterality: Left;    Allergies  Allergies  Allergen Reactions   Amoxil [Amoxicillin] Diarrhea and Nausea And Vomiting   Metformin Other (See Comments)    constipation   Pravastatin Other (See Comments)    Leg pain. Depression   Rosuvastatin Other (See Comments)    Memory problems    EKGs/Labs/Other Studies Reviewed:   The following studies were reviewed today:  Coronary CTA 05/11/2022 FINDINGS: FFRct analysis was performed on the original cardiac CT angiogram dataset. Diagrammatic representation of the FFRct analysis is provided in a separate PDF document in PACS. This dictation was created using the PDF document and an interactive 3D model of the results. 3D model is not available in the EMR/PACS. Normal FFR range is >0.80.   1. Left Main: No significant  stenosis   2. LAD: CTFFR 0.87 across lesion in mid LAD, suggesting lesion is not functionally significant   3. LCX: No significant stenosis   4. RCA: No significant stenosis   IMPRESSION: 1.  CTFFR suggests nonobstructive CAD     Electronically Signed   By: Oswaldo Milian M.D.   On: 05/11/2022 14:25    EKG:  EKG is not ordered today.   Recent Labs: 07/06/2022: ALT 31; TSH 0.815 07/11/2022: Magnesium 2.1 11/03/2022: BUN 12; Creatinine, Ser 0.38; Hemoglobin 13.5; Platelets 251; Potassium 3.9; Sodium 136  Recent Lipid Panel    Component Value Date/Time   CHOL 152 02/20/2020 0829   TRIG 66 02/20/2020 0829   HDL 84 02/20/2020 0829   CHOLHDL 1.8 02/20/2020 0829   CHOLHDL 2 06/12/2013 0832   VLDL 6.0 06/12/2013 0832   LDLCALC 55 02/20/2020 0829   Home Medications   Current Meds  Medication Sig   acetaminophen (TYLENOL) 500 MG tablet Take 1,000 mg by mouth daily as needed (pain).   BIOTIN PO Take 10,000 mcg by mouth daily.   Brimonidine Tartrate (LUMIFY OP) Place 1 drop into both eyes daily.   carvedilol (COREG) 3.125 MG tablet Take 1 tablet (3.125 mg total) by mouth 2 (two) times daily with a meal.   Cholecalciferol (VITAMIN D3) 1000 units CAPS Take 1,000 Units by mouth daily.   ezetimibe (ZETIA) 10 MG tablet Take 1 tablet (10 mg total) by mouth daily.   isosorbide mononitrate (IMDUR) 30 MG 24 hr tablet Take 1 tablet (30 mg total) by mouth daily.   meloxicam (MOBIC) 15 MG tablet Take 15 mg by mouth daily.   MILK THISTLE PO Take 350 mg by mouth daily.   nitroGLYCERIN (NITROSTAT) 0.4 MG SL tablet Place 1 tablet (0.4 mg total) under the tongue every 5 (five) minutes as needed for chest pain.   Probiotic Product (PHILLIPS COLON HEALTH PO) Take 1 capsule by mouth daily.   valsartan (DIOVAN) 80 MG tablet Take 1 tablet (80 mg total) by mouth daily.   vitamin C (ASCORBIC ACID) 500 MG tablet Take 500 mg by mouth daily.   [DISCONTINUED] valsartan (DIOVAN) 160 MG tablet Take 160  mg by mouth daily.     Review of Systems      All other systems reviewed and are otherwise negative except as noted above.  Physical Exam    VS:  BP 122/72   Pulse 71   Ht 4' 11"$  (1.499 m)   Wt 101 lb 9.6 oz (46.1 kg)   SpO2 99%   BMI 20.52 kg/m  , BMI Body mass index is 20.52 kg/m.  Wt Readings from Last 3 Encounters:  11/19/22 101 lb 9.6 oz (46.1 kg)  11/03/22 106 lb 14.8 oz (48.5 kg)  07/11/22 107 lb 12.9 oz (48.9 kg)     GEN: Well nourished, well developed, in no acute distress. HEENT: normal. Neck: Supple, no JVD, carotid bruits, or masses. Cardiac: RRR, no murmurs, rubs, or gallops. No clubbing, cyanosis, edema.  Radials/PT 2+ and equal bilaterally.  Respiratory:  Respirations regular and unlabored, clear to auscultation bilaterally. GI: Soft, nontender, nondistended. MS: No deformity or atrophy. Skin: Warm and dry, no rash. Neuro:  Strength and sensation are intact. Psych: Normal affect.  Assessment & Plan    Stable angina/coronary artery disease -no chest pain or sob -Continue Coreg 3.125 mg twice a day, Zetia 10 mg daily, Imdur 30 mg daily, nitro as needed, valsartan 80 mg daily -She is not on aspirin due to being on Mobic  Hypertension -61m daily valsartan -BP well controlled today -continue current medication regimen   Hyperlipidemia -LDL 50 -primary to draw updated lipid panel  Tobacco abuse -She is down to 5 or 6 cigarettes a day -trying to cut back  5. LE edema R > L -will get an [place] the lower legs to r/o DVT         Disposition: Follow up 1 year with HFreada Bergeron MD or APP.  Signed, TElgie Collard PA-C 11/19/2022, 12:16 PM [facility]

## 2022-11-19 ENCOUNTER — Ambulatory Visit: Payer: Medicare Other | Attending: Physician Assistant | Admitting: Physician Assistant

## 2022-11-19 ENCOUNTER — Encounter: Payer: Self-pay | Admitting: Physician Assistant

## 2022-11-19 VITALS — BP 122/72 | HR 71 | Ht 59.0 in | Wt 101.6 lb

## 2022-11-19 DIAGNOSIS — Z720 Tobacco use: Secondary | ICD-10-CM

## 2022-11-19 DIAGNOSIS — I10 Essential (primary) hypertension: Secondary | ICD-10-CM

## 2022-11-19 DIAGNOSIS — E785 Hyperlipidemia, unspecified: Secondary | ICD-10-CM | POA: Diagnosis not present

## 2022-11-19 DIAGNOSIS — I2510 Atherosclerotic heart disease of native coronary artery without angina pectoris: Secondary | ICD-10-CM | POA: Diagnosis not present

## 2022-11-19 DIAGNOSIS — R600 Localized edema: Secondary | ICD-10-CM

## 2022-11-19 MED ORDER — VALSARTAN 80 MG PO TABS: 80.0000 mg | ORAL_TABLET | Freq: Every day | ORAL | 3 refills | Status: DC

## 2022-11-19 NOTE — Patient Instructions (Signed)
Medication Instructions:  Your physician recommends that you continue on your current medications as directed. Please refer to the Current Medication list given to you today.  *If you need a refill on your cardiac medications before your next appointment, please call your pharmacy*   Lab Work: None If you have labs (blood work) drawn today and your tests are completely normal, you will receive your results only by: [place] (if you have MyChart) OR A paper copy in the mail If you have any lab test that is abnormal or we need to change your treatment, we will call you to review the results.   Testing/Procedures: Your physician has requested that you have a lower or upper extremity venous duplex. This test is an ultrasound of the veins in the legs or arms. It looks at venous blood flow that carries blood from the heart to the legs or arms. Allow one hour for a Lower Venous exam. Allow thirty minutes for an Upper Venous exam. There are no restrictions or special instructions.     Follow-Up: At [facility], you and your health needs are our priority.  As part of our continuing mission to provide you with exceptional heart care, we have created designated Provider Care Teams.  These Care Teams include your primary Cardiologist (physician) and Advanced Practice Providers (APPs -  Physician Assistants and Nurse Practitioners) who all work together to provide you with the care you need, when you need it.   Your next appointment:   1 year(s)  Provider:   Freada Bergeron, MD  or Nicholes Rough, PA-C

## 2022-11-25 ENCOUNTER — Ambulatory Visit (HOSPITAL_COMMUNITY)
Admission: RE | Admit: 2022-11-25 | Discharge: 2022-11-25 | Disposition: A | Discharge status: Home / Self Care | Payer: Medicare Other | Source: Ambulatory Visit | Attending: Physician Assistant | Admitting: Physician Assistant

## 2022-11-25 DIAGNOSIS — R600 Localized edema: Secondary | ICD-10-CM | POA: Diagnosis not present

## 2022-12-06 DIAGNOSIS — T84020D Dislocation of internal right hip prosthesis, subsequent encounter: Secondary | ICD-10-CM | POA: Diagnosis not present

## 2022-12-08 ENCOUNTER — Ambulatory Visit: Payer: Self-pay | Admitting: Surgery

## 2022-12-08 DIAGNOSIS — K4090 Unilateral inguinal hernia, without obstruction or gangrene, not specified as recurrent: Secondary | ICD-10-CM | POA: Diagnosis not present

## 2022-12-08 DIAGNOSIS — Z720 Tobacco use: Secondary | ICD-10-CM | POA: Diagnosis not present

## 2022-12-08 DIAGNOSIS — Z8719 Personal history of other diseases of the digestive system: Secondary | ICD-10-CM | POA: Diagnosis not present

## 2022-12-10 DIAGNOSIS — K5790 Diverticulosis of intestine, part unspecified, without perforation or abscess without bleeding: Secondary | ICD-10-CM | POA: Diagnosis not present

## 2022-12-10 DIAGNOSIS — Z8719 Personal history of other diseases of the digestive system: Secondary | ICD-10-CM | POA: Diagnosis not present

## 2023-01-06 ENCOUNTER — Other Ambulatory Visit: Payer: Self-pay | Admitting: Family Medicine

## 2023-01-06 DIAGNOSIS — Z1231 Encounter for screening mammogram for malignant neoplasm of breast: Secondary | ICD-10-CM

## 2023-01-21 DIAGNOSIS — K4120 Bilateral femoral hernia, without obstruction or gangrene, not specified as recurrent: Secondary | ICD-10-CM | POA: Diagnosis not present

## 2023-02-18 ENCOUNTER — Ambulatory Visit
Admission: RE | Admit: 2023-02-18 | Discharge: 2023-02-18 | Disposition: A | Discharge status: Home / Self Care | Payer: Medicare Other | Source: Ambulatory Visit | Attending: Family Medicine | Admitting: Family Medicine

## 2023-02-18 DIAGNOSIS — E119 Type 2 diabetes mellitus without complications: Secondary | ICD-10-CM | POA: Diagnosis not present

## 2023-02-18 DIAGNOSIS — I10 Essential (primary) hypertension: Secondary | ICD-10-CM | POA: Diagnosis not present

## 2023-02-18 DIAGNOSIS — Z1231 Encounter for screening mammogram for malignant neoplasm of breast: Secondary | ICD-10-CM | POA: Diagnosis not present

## 2023-02-18 DIAGNOSIS — G720 Drug-induced myopathy: Secondary | ICD-10-CM | POA: Diagnosis not present

## 2023-02-18 DIAGNOSIS — E1169 Type 2 diabetes mellitus with other specified complication: Secondary | ICD-10-CM | POA: Diagnosis not present

## 2023-02-23 ENCOUNTER — Other Ambulatory Visit: Payer: Self-pay | Admitting: Family Medicine

## 2023-02-23 DIAGNOSIS — R928 Other abnormal and inconclusive findings on diagnostic imaging of breast: Secondary | ICD-10-CM

## 2023-03-02 DIAGNOSIS — H35442 Age-related reticular degeneration of retina, left eye: Secondary | ICD-10-CM | POA: Diagnosis not present

## 2023-03-02 DIAGNOSIS — H25811 Combined forms of age-related cataract, right eye: Secondary | ICD-10-CM | POA: Diagnosis not present

## 2023-03-02 DIAGNOSIS — E119 Type 2 diabetes mellitus without complications: Secondary | ICD-10-CM | POA: Diagnosis not present

## 2023-03-02 DIAGNOSIS — H04123 Dry eye syndrome of bilateral lacrimal glands: Secondary | ICD-10-CM | POA: Diagnosis not present

## 2023-03-04 ENCOUNTER — Ambulatory Visit
Admission: RE | Admit: 2023-03-04 | Discharge: 2023-03-04 | Disposition: A | Discharge status: Home / Self Care | Payer: Medicare Other | Source: Ambulatory Visit | Attending: Family Medicine | Admitting: Family Medicine

## 2023-03-04 ENCOUNTER — Ambulatory Visit: Payer: Medicare Other

## 2023-03-04 DIAGNOSIS — R92331 Mammographic heterogeneous density, right breast: Secondary | ICD-10-CM | POA: Diagnosis not present

## 2023-03-04 DIAGNOSIS — R928 Other abnormal and inconclusive findings on diagnostic imaging of breast: Secondary | ICD-10-CM

## 2023-03-04 DIAGNOSIS — R922 Inconclusive mammogram: Secondary | ICD-10-CM | POA: Diagnosis not present

## 2023-03-21 ENCOUNTER — Other Ambulatory Visit: Payer: Self-pay | Admitting: Acute Care

## 2023-03-21 DIAGNOSIS — Z87891 Personal history of nicotine dependence: Secondary | ICD-10-CM

## 2023-03-21 DIAGNOSIS — Z122 Encounter for screening for malignant neoplasm of respiratory organs: Secondary | ICD-10-CM

## 2023-03-21 DIAGNOSIS — F17210 Nicotine dependence, cigarettes, uncomplicated: Secondary | ICD-10-CM

## 2023-03-29 DIAGNOSIS — S098XXA Other specified injuries of head, initial encounter: Secondary | ICD-10-CM | POA: Diagnosis present

## 2023-03-29 DIAGNOSIS — W108XXA Fall (on) (from) other stairs and steps, initial encounter: Secondary | ICD-10-CM | POA: Insufficient documentation

## 2023-03-29 DIAGNOSIS — J449 Chronic obstructive pulmonary disease, unspecified: Secondary | ICD-10-CM | POA: Diagnosis not present

## 2023-03-29 DIAGNOSIS — Z23 Encounter for immunization: Secondary | ICD-10-CM | POA: Diagnosis not present

## 2023-03-29 DIAGNOSIS — E119 Type 2 diabetes mellitus without complications: Secondary | ICD-10-CM | POA: Diagnosis not present

## 2023-03-29 DIAGNOSIS — I10 Essential (primary) hypertension: Secondary | ICD-10-CM | POA: Diagnosis not present

## 2023-03-29 DIAGNOSIS — S0101XA Laceration without foreign body of scalp, initial encounter: Secondary | ICD-10-CM | POA: Insufficient documentation

## 2023-03-29 NOTE — ED Triage Notes (Signed)
Patient lost footing on porch steps, falling hitting her head on the driveway. Patient denies any LOC, also denies use of blood thinners. Patient has approximately 2 inch laceration to the back of her head. Laceration is currently still bleeding. Patient laceration irrigated with normal saline during triage.

## 2023-03-30 ENCOUNTER — Emergency Department (HOSPITAL_BASED_OUTPATIENT_CLINIC_OR_DEPARTMENT_OTHER)
Admission: EM | Admit: 2023-03-30 | Discharge: 2023-03-30 | Disposition: A | Discharge status: Home / Self Care | Payer: Medicare Other | Attending: Emergency Medicine | Admitting: Emergency Medicine

## 2023-03-30 ENCOUNTER — Emergency Department (HOSPITAL_BASED_OUTPATIENT_CLINIC_OR_DEPARTMENT_OTHER): Payer: Medicare Other

## 2023-03-30 ENCOUNTER — Encounter (HOSPITAL_BASED_OUTPATIENT_CLINIC_OR_DEPARTMENT_OTHER): Payer: Self-pay

## 2023-03-30 DIAGNOSIS — W19XXXA Unspecified fall, initial encounter: Secondary | ICD-10-CM

## 2023-03-30 DIAGNOSIS — G9389 Other specified disorders of brain: Secondary | ICD-10-CM | POA: Diagnosis not present

## 2023-03-30 DIAGNOSIS — S0101XA Laceration without foreign body of scalp, initial encounter: Secondary | ICD-10-CM

## 2023-03-30 DIAGNOSIS — R220 Localized swelling, mass and lump, head: Secondary | ICD-10-CM | POA: Diagnosis not present

## 2023-03-30 MED ORDER — TETANUS-DIPHTH-ACELL PERTUSSIS 5-2.5-18.5 LF-MCG/0.5 IM SUSY
0.5000 mL | PREFILLED_SYRINGE | Freq: Once | INTRAMUSCULAR | Status: AC
  Administered 2023-03-30: 0.5 mL via INTRAMUSCULAR
  Filled 2023-03-30: qty 0.5

## 2023-03-30 NOTE — ED Notes (Signed)
After laceration was cleaned and stapled by Dr.Horton, 2 2x2 gauze pads covered with abx ointment, placed over lac and secured with wide coban with some compression.Instructions given tp patient and husband re: cleaning hair and wound and changing dressing.

## 2023-03-30 NOTE — Discharge Instructions (Signed)
You were seen today and found to have a minor head injury with a scalp laceration.  Staples were placed.  You need to have these removed in approximately 14 days.  You may wash your hair normally but avoid directly manipulating the area that has been repaired.

## 2023-03-30 NOTE — ED Provider Notes (Signed)
[place] EMERGENCY DEPARTMENT AT [facility] Provider Note   CSN: [id number] Arrival date & time: 03/29/23  2335     History  Chief Complaint  Patient presents with   Marletta Lor    Melissa Waters is a 72 y.o. female.  HPI    This is a 72 year old female who presents after a fall.  Patient reports mechanical fall when she lost her footing on the front porch steps.  She did hit her head.  She did not lose consciousness.  She is not on any blood thinners.  Denies pain elsewhere.  Has been ambulatory since.  No hip pain, knee pain, chest pain, shortness of breath. Home Medications Prior to Admission medications   Medication Sig Start Date End Date Taking? Authorizing Provider  acetaminophen (TYLENOL) 500 MG tablet Take 1,000 mg by mouth daily as needed (pain).    [provider]  BIOTIN PO Take 10,000 mcg by mouth daily.    [provider]  Brimonidine Tartrate (LUMIFY OP) Place 1 drop into both eyes daily.    [provider]  carvedilol (COREG) 3.125 MG tablet Take 1 tablet (3.125 mg total) by mouth 2 (two) times daily with a meal. 04/29/22   Meriam Sprague, MD  Cholecalciferol (VITAMIN D3) 1000 units CAPS Take 1,000 Units by mouth daily.    [provider]  ezetimibe (ZETIA) 10 MG tablet Take 1 tablet (10 mg total) by mouth daily. 10/05/22   Meriam Sprague, MD  isosorbide mononitrate (IMDUR) 30 MG 24 hr tablet Take 1 tablet (30 mg total) by mouth daily. 07/14/22   Meriam Sprague, MD  meloxicam (MOBIC) 15 MG tablet Take 15 mg by mouth daily.    [provider]  MILK THISTLE PO Take 350 mg by mouth daily.    [provider]  nitroGLYCERIN (NITROSTAT) 0.4 MG SL tablet Place 1 tablet (0.4 mg total) under the tongue every 5 (five) minutes as needed for chest pain. 12/05/20   Meriam Sprague, MD  Probiotic Product (PHILLIPS COLON HEALTH PO) Take 1 capsule by mouth daily.    [provider]  valsartan  (DIOVAN) 80 MG tablet Take 1 tablet (80 mg total) by mouth daily. 11/19/22   Sharlene Dory, PA-C  vitamin C (ASCORBIC ACID) 500 MG tablet Take 500 mg by mouth daily.    [provider]      Allergies    Amoxil [amoxicillin], Metformin, Pravastatin, and Rosuvastatin    Review of Systems   Review of Systems  Constitutional:  Negative for fever.  Respiratory:  Negative for shortness of breath.   Musculoskeletal:  Negative for neck pain.  Skin:  Positive for wound.  All other systems reviewed and are negative.   Physical Exam Updated Vital Signs BP (!) 144/66 (BP Location: Left Arm)   Pulse 64   Temp 98 F (36.7 C)   Resp 18   Ht 1.473 m (4\' 10" )   Wt 47.6 kg   SpO2 98%   BMI 21.95 kg/m  Physical Exam Vitals and nursing note reviewed.  Constitutional:      Appearance: She is well-developed. She is not ill-appearing.  HENT:     Head: Normocephalic.     Comments: Slightly gaping 4 cm laceration over the occiput, no active bleeding    Mouth/Throat:     Mouth: Mucous membranes are moist.  Eyes:     Pupils: Pupils are equal, round, and reactive to light.  Cardiovascular:  Rate and Rhythm: Normal rate and regular rhythm.  Pulmonary:     Effort: Pulmonary effort is normal. No respiratory distress.     Breath sounds: No wheezing.  Abdominal:     Palpations: Abdomen is soft.  Musculoskeletal:        General: No deformity. Normal range of motion.     Cervical back: Neck supple.  Skin:    General: Skin is warm and dry.  Neurological:     Mental Status: She is alert and oriented to person, place, and time.  Psychiatric:        Mood and Affect: Mood normal.     ED Results / Procedures / Treatments   Labs (all labs ordered are listed, but only abnormal results are displayed) Labs Reviewed - No data to display  EKG None  Radiology CT Head Wo Contrast  Result Date: 03/30/2023 CLINICAL DATA:  Status post fall. EXAM: CT HEAD WITHOUT CONTRAST TECHNIQUE:  Contiguous axial images were obtained from the base of the skull through the vertex without intravenous contrast. RADIATION DOSE REDUCTION: This exam was performed according to the departmental dose-optimization program which includes automated exposure control, adjustment of the mA and/or kV according to patient size and/or use of iterative reconstruction technique. COMPARISON:  None Available. FINDINGS: Brain: There is mild cerebral atrophy with widening of the extra-axial spaces and ventricular dilatation. There are areas of decreased attenuation within the white matter tracts of the supratentorial brain, consistent with microvascular disease changes. Vascular: No hyperdense vessel or unexpected calcification. Skull: Normal. Negative for fracture or focal lesion. Sinuses/Orbits: No acute finding. Other: Mild scalp soft tissue swelling is seen along the posterior aspect of the vertex on the right. IMPRESSION: 1. Mild scalp soft tissue swelling along the posterior aspect of the vertex on the right. 2. No acute intracranial abnormality. 3. Generalized cerebral atrophy and microvascular disease changes of the supratentorial brain. Electronically Signed   By: Aram Candela M.D.   On: 03/30/2023 01:15    Procedures .Marland KitchenLaceration Repair  Date/Time: 03/30/2023 1:36 AM  Performed by: Shon Baton, MD Authorized by: Shon Baton, MD   Consent:    Consent obtained:  Verbal   Consent given by:  Patient   Risks discussed:  Infection, pain and poor cosmetic result Anesthesia:    Anesthesia method:  None Laceration details:    Location:  Scalp   Scalp location:  Crown   Length (cm):  4   Depth (mm):  5 Exploration:    Wound exploration: wound explored through full range of motion and entire depth of wound visualized     Contaminated: no   Treatment:    Area cleansed with:  Saline   Amount of cleaning:  Standard   Irrigation solution:  Sterile saline   Visualized foreign bodies/material  removed: no     Debridement:  None   Undermining:  None Skin repair:    Repair method:  Staples   Number of staples:  4 Approximation:    Approximation:  Close Repair type:    Repair type:  Simple Post-procedure details:    Dressing:  Open (no dressing)     Medications Ordered in ED Medications  Tdap (BOOSTRIX) injection 0.5 mL (0.5 mLs Intramuscular Given 03/30/23 0124)    ED Course/ Medical Decision Making/ A&P                             Medical Decision Making Amount  and/or Complexity of Data Reviewed Radiology: ordered.  Risk Prescription drug management.   This patient presents to the ED for concern of fall, this involves an extensive number of treatment options, and is a complaint that carries with it a high risk of complications and morbidity.  I considered the following differential and admission for this acute, potentially life threatening condition.  The differential diagnosis includes injury, laceration, subdural  MDM:    This is a 72 year old female who presents after mechanical fall.  She fell hitting her head.  She is nontoxic.  She has evidence of a scalp laceration.  CT head obtained and shows no evidence of intracranial injury.  Do not appreciate any further injuries.  Tetanus was updated.  Laceration was repaired.  Patient was instructed to have her staples removed in approximately 14 days.   (Labs, imaging, consults)  Labs: I Ordered, and personally interpreted labs.  The pertinent results include: None  Imaging Studies ordered: I ordered imaging studies including CT head I independently visualized and interpreted imaging. I agree with the radiologist interpretation  Additional history obtained from chart review.  External records from outside source obtained and reviewed including prior evaluations  Cardiac Monitoring: The patient was maintained on a cardiac monitor.  If on the cardiac monitor, I personally viewed and interpreted the cardiac  monitored which showed an underlying rhythm of: Sinus rhythm  Reevaluation: After the interventions noted above, I reevaluated the patient and found that they have :improved  Social Determinants of Health:  lives independently  Disposition: Discharge  Co morbidities that complicate the patient evaluation  Past Medical History:  Diagnosis Date   COPD (chronic obstructive pulmonary disease) ([facility])    Diabetes mellitus without complication ([facility])    no meds    Diverticulosis    Hx: of   Heart murmur    as a baby   Hypertension    Nephritis    Hx: of 1958   Osteoarthritis    right hip   Osteoarthritis of left hip 08/13/2013     Medicines Meds ordered this encounter  Medications   Tdap (BOOSTRIX) injection 0.5 mL    I have reviewed the patients home medicines and have made adjustments as needed  Problem List / ED Course: Problem List Items Addressed This Visit   None Visit Diagnoses     Fall, initial encounter    -  Primary   Laceration of scalp, initial encounter                      Final Clinical Impression(s) / ED Diagnoses Final diagnoses:  Fall, initial encounter  Laceration of scalp, initial encounter    Rx / DC Orders ED Discharge Orders     None         Joden Bonsall, Mayer Masker, MD 03/30/23 [phone]

## 2023-04-05 DIAGNOSIS — M1991 Primary osteoarthritis, unspecified site: Secondary | ICD-10-CM | POA: Diagnosis not present

## 2023-04-05 DIAGNOSIS — M79642 Pain in left hand: Secondary | ICD-10-CM | POA: Diagnosis not present

## 2023-04-05 DIAGNOSIS — I7300 Raynaud's syndrome without gangrene: Secondary | ICD-10-CM | POA: Diagnosis not present

## 2023-04-05 DIAGNOSIS — Z5181 Encounter for therapeutic drug level monitoring: Secondary | ICD-10-CM | POA: Diagnosis not present

## 2023-04-05 DIAGNOSIS — Z6820 Body mass index (BMI) 20.0-20.9, adult: Secondary | ICD-10-CM | POA: Diagnosis not present

## 2023-04-05 DIAGNOSIS — M47819 Spondylosis without myelopathy or radiculopathy, site unspecified: Secondary | ICD-10-CM | POA: Diagnosis not present

## 2023-04-05 DIAGNOSIS — M25579 Pain in unspecified ankle and joints of unspecified foot: Secondary | ICD-10-CM | POA: Diagnosis not present

## 2023-04-11 ENCOUNTER — Other Ambulatory Visit: Payer: Self-pay

## 2023-04-11 MED ORDER — ISOSORBIDE MONONITRATE ER 30 MG PO TB24: 30.0000 mg | ORAL_TABLET | Freq: Every day | ORAL | 2 refills | Status: DC

## 2023-04-15 DIAGNOSIS — S0990XD Unspecified injury of head, subsequent encounter: Secondary | ICD-10-CM | POA: Diagnosis not present

## 2023-04-15 DIAGNOSIS — S0101XA Laceration without foreign body of scalp, initial encounter: Secondary | ICD-10-CM | POA: Diagnosis not present

## 2023-04-15 DIAGNOSIS — Z09 Encounter for follow-up examination after completed treatment for conditions other than malignant neoplasm: Secondary | ICD-10-CM | POA: Diagnosis not present

## 2023-04-22 DIAGNOSIS — M4120 Other idiopathic scoliosis, site unspecified: Secondary | ICD-10-CM | POA: Diagnosis not present

## 2023-04-22 DIAGNOSIS — M545 Low back pain, unspecified: Secondary | ICD-10-CM | POA: Diagnosis not present

## 2023-05-09 ENCOUNTER — Ambulatory Visit
Admission: RE | Admit: 2023-05-09 | Discharge: 2023-05-09 | Disposition: A | Discharge status: Home / Self Care | Payer: Medicare Other | Source: Ambulatory Visit | Attending: Family Medicine | Admitting: Family Medicine

## 2023-05-09 DIAGNOSIS — F17210 Nicotine dependence, cigarettes, uncomplicated: Secondary | ICD-10-CM

## 2023-05-09 DIAGNOSIS — Z122 Encounter for screening for malignant neoplasm of respiratory organs: Secondary | ICD-10-CM

## 2023-05-09 DIAGNOSIS — Z87891 Personal history of nicotine dependence: Secondary | ICD-10-CM

## 2023-05-10 ENCOUNTER — Other Ambulatory Visit: Payer: Self-pay | Admitting: Gastroenterology

## 2023-05-10 DIAGNOSIS — R933 Abnormal findings on diagnostic imaging of other parts of digestive tract: Secondary | ICD-10-CM | POA: Diagnosis not present

## 2023-05-10 DIAGNOSIS — K648 Other hemorrhoids: Secondary | ICD-10-CM | POA: Diagnosis not present

## 2023-05-10 DIAGNOSIS — K5730 Diverticulosis of large intestine without perforation or abscess without bleeding: Secondary | ICD-10-CM | POA: Diagnosis not present

## 2023-05-10 DIAGNOSIS — Q438 Other specified congenital malformations of intestine: Secondary | ICD-10-CM

## 2023-05-10 DIAGNOSIS — Z1211 Encounter for screening for malignant neoplasm of colon: Secondary | ICD-10-CM

## 2023-05-10 DIAGNOSIS — K5790 Diverticulosis of intestine, part unspecified, without perforation or abscess without bleeding: Secondary | ICD-10-CM

## 2023-05-13 ENCOUNTER — Other Ambulatory Visit: Payer: Self-pay | Admitting: Acute Care

## 2023-05-13 DIAGNOSIS — Z122 Encounter for screening for malignant neoplasm of respiratory organs: Secondary | ICD-10-CM

## 2023-05-13 DIAGNOSIS — Z87891 Personal history of nicotine dependence: Secondary | ICD-10-CM

## 2023-05-13 DIAGNOSIS — F17210 Nicotine dependence, cigarettes, uncomplicated: Secondary | ICD-10-CM

## 2023-05-20 DIAGNOSIS — M4120 Other idiopathic scoliosis, site unspecified: Secondary | ICD-10-CM | POA: Diagnosis not present

## 2023-06-13 ENCOUNTER — Ambulatory Visit
Admission: RE | Admit: 2023-06-13 | Discharge: 2023-06-13 | Disposition: A | Discharge status: Home / Self Care | Payer: Medicare Other | Source: Ambulatory Visit | Attending: Gastroenterology | Admitting: Gastroenterology

## 2023-06-13 DIAGNOSIS — K5790 Diverticulosis of intestine, part unspecified, without perforation or abscess without bleeding: Secondary | ICD-10-CM

## 2023-06-13 DIAGNOSIS — Z1211 Encounter for screening for malignant neoplasm of colon: Secondary | ICD-10-CM

## 2023-06-13 DIAGNOSIS — K5730 Diverticulosis of large intestine without perforation or abscess without bleeding: Secondary | ICD-10-CM | POA: Diagnosis not present

## 2023-06-13 DIAGNOSIS — Q438 Other specified congenital malformations of intestine: Secondary | ICD-10-CM

## 2023-06-22 ENCOUNTER — Other Ambulatory Visit: Payer: Self-pay

## 2023-06-22 MED ORDER — CARVEDILOL 3.125 MG PO TABS: 3.1250 mg | ORAL_TABLET | Freq: Two times a day (BID) | ORAL | 1 refills | Status: DC

## 2023-07-12 DIAGNOSIS — M2041 Other hammer toe(s) (acquired), right foot: Secondary | ICD-10-CM | POA: Diagnosis not present

## 2023-07-12 DIAGNOSIS — M25571 Pain in right ankle and joints of right foot: Secondary | ICD-10-CM | POA: Diagnosis not present

## 2023-08-18 DIAGNOSIS — E1169 Type 2 diabetes mellitus with other specified complication: Secondary | ICD-10-CM | POA: Diagnosis not present

## 2023-08-18 DIAGNOSIS — I10 Essential (primary) hypertension: Secondary | ICD-10-CM | POA: Diagnosis not present

## 2023-08-18 DIAGNOSIS — E782 Mixed hyperlipidemia: Secondary | ICD-10-CM | POA: Diagnosis not present

## 2023-08-18 DIAGNOSIS — E559 Vitamin D deficiency, unspecified: Secondary | ICD-10-CM | POA: Diagnosis not present

## 2023-08-25 ENCOUNTER — Other Ambulatory Visit: Payer: Self-pay | Admitting: Family Medicine

## 2023-08-25 DIAGNOSIS — I700 Atherosclerosis of aorta: Secondary | ICD-10-CM | POA: Diagnosis not present

## 2023-08-25 DIAGNOSIS — I10 Essential (primary) hypertension: Secondary | ICD-10-CM | POA: Diagnosis not present

## 2023-08-25 DIAGNOSIS — J449 Chronic obstructive pulmonary disease, unspecified: Secondary | ICD-10-CM | POA: Diagnosis not present

## 2023-08-25 DIAGNOSIS — E782 Mixed hyperlipidemia: Secondary | ICD-10-CM | POA: Diagnosis not present

## 2023-08-25 DIAGNOSIS — M8580 Other specified disorders of bone density and structure, unspecified site: Secondary | ICD-10-CM

## 2023-08-25 DIAGNOSIS — Z0000 Encounter for general adult medical examination without abnormal findings: Secondary | ICD-10-CM | POA: Diagnosis not present

## 2023-08-25 DIAGNOSIS — G720 Drug-induced myopathy: Secondary | ICD-10-CM | POA: Diagnosis not present

## 2023-09-30 ENCOUNTER — Other Ambulatory Visit: Payer: Self-pay

## 2023-09-30 MED ORDER — EZETIMIBE 10 MG PO TABS: 10.0000 mg | ORAL_TABLET | Freq: Every day | ORAL | 0 refills | Status: DC

## 2023-10-11 DIAGNOSIS — Z5181 Encounter for therapeutic drug level monitoring: Secondary | ICD-10-CM | POA: Diagnosis not present

## 2023-10-11 DIAGNOSIS — M25579 Pain in unspecified ankle and joints of unspecified foot: Secondary | ICD-10-CM | POA: Diagnosis not present

## 2023-10-11 DIAGNOSIS — Z6820 Body mass index (BMI) 20.0-20.9, adult: Secondary | ICD-10-CM | POA: Diagnosis not present

## 2023-10-11 DIAGNOSIS — M47819 Spondylosis without myelopathy or radiculopathy, site unspecified: Secondary | ICD-10-CM | POA: Diagnosis not present

## 2023-10-11 DIAGNOSIS — M1991 Primary osteoarthritis, unspecified site: Secondary | ICD-10-CM | POA: Diagnosis not present

## 2023-11-20 NOTE — Progress Notes (Unsigned)
 Office Visit    Patient Name: Melissa Waters Date of Encounter: 11/21/2023  PCP:  Daisy Floro, MD   [facility]  Cardiologist:  Meriam Sprague, MD (Inactive)  Advanced Practice Provider:  No care team member to display Electrophysiologist:  None   HPI    Melissa Waters is a 73 y.o. female with a past medical history significant for hypertension, family history of CAD, tobacco abuse, coronary calcifications Presents today for follow-up appointment.  Saw Dr. Delton See 04/2020.  She had been following in the lipid clinic for statin intolerance, currently on Zetia.  She was active and asymptomatic at that time.  Lexiscan Myoview 2016 with negative for ischemia.  Chest CT for lung cancer screening 01/2018 showed coronary atherosclerosis in the LAD.  Was seen last in the clinic 12/2020 where she was doing well from a CV standpoint.  She was last seen by Dr. Shari Prows 04/2022 and at that time she was feeling well.  States she had been needing to use nitro more often than previously due to chest pressure radiating to her back.  Symptoms resolved with 1 pill within a couple of minutes.  She can then resume her activity.  No significant shortness of breath.  Ambulation is limited due to foot pain.  No orthopnea, PND, or LE edema.  Continues to smoke about 4 to 6 cigarettes a day.  I saw her last in November 19, 2022, she tells me that she has not had any issues with her heart.  No chest pain.  She has had problems with her foot for 20 years.  She has issues with hammertoes and calluses.  The right foot was having some swelling and she was started on Mobic with resolution.  However, over the last week she has had increased swelling on the right side with no apparent cause.  She is not getting much physical activity at the moment because she fears her hip will dislocate again.  October 24, 2022 at 5:30 AM she bent over to care for her cat and her hip popped out.  She had to  go to the ER and get some pain medicine and it was popped back in place.  Prior to that she was in the ER in October for diverticulitis.  She has had no further episodes.  She takes daily Benefiber and a stool softener as needed.   Reports no shortness of breath nor dyspnea on exertion. Reports no chest pain, pressure, or tightness. No edema, orthopnea, PND. Reports no palpitations.   Today, she presents with a history of heart disease, for a routine follow-up. She reports no new symptoms related to her heart condition. She has not needed to use her nitroglycerin, indicating stable angina. She has been adhering to her medication regimen, which includes valsartan, isosorbide, carvedilol, and Zetia.  The patient also discusses recent health issues, including a hernia surgery last spring and a laceration from a fall off her porch during the summer, which required staples. She also mentions a history of diverticulitis and a recent virtual colonoscopy due to a blockage preventing the completion of a regular colonoscopy.  The patient's spouse mentions ongoing orthopedic issues, including a need for ankle surgery due to bone-on-bone arthritis. The patient also mentions having hammer toes and a problem with her thumb  Reports no shortness of breath nor dyspnea on exertion. Reports no chest pain, pressure, or tightness. No edema, orthopnea, PND. Reports no palpitations.   Discussed the  use of AI scribe software for clinical note transcription with the patient, who gave verbal consent to proceed.  Past Medical History    Past Medical History:  Diagnosis Date   COPD (chronic obstructive pulmonary disease) (HCC)    Diabetes mellitus without complication (HCC)    no meds    Diverticulosis    Hx: of   Heart murmur    as a baby   Hypertension    Nephritis    Hx: of 1958   Osteoarthritis    right hip   Osteoarthritis of left hip 08/13/2013   Past Surgical History:  Procedure Laterality Date    COLONOSCOPY W/ POLYPECTOMY  2006   HIP CLOSED REDUCTION  11/03/2022   Procedure: CLOSED MANIPULATION HIP;  Surgeon: Joen Rielly, MD;  Location: [facility];  Service: Orthopedics;;   TOTAL HIP ARTHROPLASTY Right    05/27/2009   TOTAL HIP ARTHROPLASTY Left 08/13/2013   Procedure: TOTAL HIP ARTHROPLASTY;  Surgeon: Eulas Post, MD;  Location: [facility] OR;  Service: Orthopedics;  Laterality: Left;    Allergies  Allergies  Allergen Reactions   Amoxil [Amoxicillin] Diarrhea and Nausea And Vomiting   Metformin Other (See Comments)    constipation   Pravastatin Other (See Comments)    Leg pain. Depression   Rosuvastatin Other (See Comments)    Memory problems    EKGs/Labs/Other Studies Reviewed:   The following studies were reviewed today:  Coronary CTA 05/11/2022 FINDINGS: FFRct analysis was performed on the original cardiac CT angiogram dataset. Diagrammatic representation of the FFRct analysis is provided in a separate PDF document in PACS. This dictation was created using the PDF document and an interactive 3D model of the results. 3D model is not available in the EMR/PACS. Normal FFR range is >0.80.   1. Left Main: No significant stenosis   2. LAD: CTFFR 0.87 across lesion in mid LAD, suggesting lesion is not functionally significant   3. LCX: No significant stenosis   4. RCA: No significant stenosis   IMPRESSION: 1.  CTFFR suggests nonobstructive CAD     Electronically Signed   By: Epifanio Lesches M.D.   On: 05/11/2022 14:25    EKG:  EKG is not ordered today.   Recent Labs: No results found for requested labs within last 365 days.  Recent Lipid Panel    Component Value Date/Time   CHOL 152 02/20/2020 0829   TRIG 66 02/20/2020 0829   HDL 84 02/20/2020 0829   CHOLHDL 1.8 02/20/2020 0829   CHOLHDL 2 06/12/2013 0832   VLDL 6.0 06/12/2013 0832   LDLCALC 55 02/20/2020 0829   Home Medications   Current Meds  Medication Sig   acetaminophen (TYLENOL)  500 MG tablet Take 1,000 mg by mouth daily as needed (pain).   BIOTIN PO Take 10,000 mcg by mouth daily.   Brimonidine Tartrate (LUMIFY OP) Place 1 drop into both eyes daily.   Cholecalciferol (VITAMIN D3) 1000 units CAPS Take 1,000 Units by mouth daily.   meloxicam (MOBIC) 15 MG tablet Take 15 mg by mouth daily.   MILK THISTLE PO Take 350 mg by mouth daily.   Probiotic Product (PHILLIPS COLON HEALTH PO) Take 1 capsule by mouth daily.   vitamin C (ASCORBIC ACID) 500 MG tablet Take 500 mg by mouth daily.   [DISCONTINUED] carvedilol (COREG) 3.125 MG tablet Take 1 tablet (3.125 mg total) by mouth 2 (two) times daily with a meal.   [DISCONTINUED] ezetimibe (ZETIA) 10 MG tablet Take 1 tablet (  10 mg total) by mouth daily.   [DISCONTINUED] isosorbide mononitrate (IMDUR) 30 MG 24 hr tablet Take 1 tablet (30 mg total) by mouth daily.   [DISCONTINUED] nitroGLYCERIN (NITROSTAT) 0.4 MG SL tablet Place 1 tablet (0.4 mg total) under the tongue every 5 (five) minutes as needed for chest pain.   [DISCONTINUED] valsartan (DIOVAN) 80 MG tablet Take 1 tablet (80 mg total) by mouth daily.     Review of Systems      All other systems reviewed and are otherwise negative except as noted above.  Physical Exam    VS:  BP 122/74   Pulse 71   Ht 4\' 10"  (1.473 m)   Wt 98 lb (44.5 kg)   SpO2 96%   BMI 20.48 kg/m  , BMI Body mass index is 20.48 kg/m.  Wt Readings from Last 3 Encounters:  11/21/23 98 lb (44.5 kg)  03/29/23 105 lb (47.6 kg)  11/19/22 101 lb 9.6 oz (46.1 kg)     GEN: Well nourished, well developed, in no acute distress. HEENT: normal. Neck: Supple, no JVD, carotid bruits, or masses. Cardiac: RRR, no murmurs, rubs, or gallops. No clubbing, cyanosis, edema.  Radials/PT 2+ and equal bilaterally.  Respiratory:  Respirations regular and unlabored, clear to auscultation bilaterally. GI: Soft, nontender, nondistended. MS: No deformity or atrophy. Skin: Warm and dry, no rash. Neuro:  Strength and  sensation are intact. Psych: Normal affect.  Assessment & Plan    Hypertension -80mg  daily valsartan -BP well controlled today -continue current medication regimen  Hyperlipidemia -LDL 57 -primary to draw updated lipid panel   LE edema R > L -will get an US of the lower legs to r/o DVT   Stable Ischemic Heart Disease No new symptoms or need for nitroglycerin. -Continue current medications:Diovan, Imdur, Carvedilol, and Zetia. -Renew nitroglycerin prescription due to expiration.         Disposition: Follow up 1 year with me.  Signed, Sharlene Dory, PA-C 11/21/2023, 5:10 PM [facility]

## 2023-11-21 ENCOUNTER — Encounter: Payer: Self-pay | Admitting: Physician Assistant

## 2023-11-21 ENCOUNTER — Ambulatory Visit: Payer: Medicare Other | Attending: Physician Assistant | Admitting: Physician Assistant

## 2023-11-21 VITALS — BP 122/74 | HR 71 | Ht <= 58 in | Wt 98.0 lb

## 2023-11-21 DIAGNOSIS — Z720 Tobacco use: Secondary | ICD-10-CM | POA: Diagnosis not present

## 2023-11-21 DIAGNOSIS — I2089 Other forms of angina pectoris: Secondary | ICD-10-CM

## 2023-11-21 DIAGNOSIS — I2510 Atherosclerotic heart disease of native coronary artery without angina pectoris: Secondary | ICD-10-CM

## 2023-11-21 DIAGNOSIS — I10 Essential (primary) hypertension: Secondary | ICD-10-CM | POA: Diagnosis not present

## 2023-11-21 DIAGNOSIS — E785 Hyperlipidemia, unspecified: Secondary | ICD-10-CM | POA: Diagnosis not present

## 2023-11-21 DIAGNOSIS — R600 Localized edema: Secondary | ICD-10-CM

## 2023-11-21 DIAGNOSIS — R072 Precordial pain: Secondary | ICD-10-CM | POA: Diagnosis not present

## 2023-11-21 MED ORDER — VALSARTAN 80 MG PO TABS: 80.0000 mg | ORAL_TABLET | Freq: Every day | ORAL | 3 refills | Status: AC

## 2023-11-21 MED ORDER — CARVEDILOL 3.125 MG PO TABS: 3.1250 mg | ORAL_TABLET | Freq: Two times a day (BID) | ORAL | 3 refills | Status: AC

## 2023-11-21 MED ORDER — EZETIMIBE 10 MG PO TABS: 10.0000 mg | ORAL_TABLET | Freq: Every day | ORAL | 3 refills | Status: AC

## 2023-11-21 MED ORDER — ISOSORBIDE MONONITRATE ER 30 MG PO TB24: 30.0000 mg | ORAL_TABLET | Freq: Every day | ORAL | 3 refills | Status: DC

## 2023-11-21 MED ORDER — NITROGLYCERIN 0.4 MG SL SUBL: 0.4000 mg | SUBLINGUAL_TABLET | SUBLINGUAL | 3 refills | Status: AC | PRN

## 2023-11-21 NOTE — Patient Instructions (Signed)
 Medication Instructions:  Your physician recommends that you continue on your current medications as directed. Please refer to the Current Medication list given to you today.  *If you need a refill on your cardiac medications before your next appointment, please call your pharmacy*   Follow-Up: At [facility], you and your health needs are our priority.  As part of our continuing mission to provide you with exceptional heart care, we have created designated Provider Care Teams.  These Care Teams include your primary Cardiologist (physician) and Advanced Practice Providers (APPs -  Physician Assistants and Nurse Practitioners) who all work together to provide you with the care you need, when you need it.   Your next appointment:   1 year  Provider:   Jari Favre, PA-C       1st Floor: - Lobby - Registration  - Pharmacy  - Lab - Cafe  2nd Floor: - PV Lab - Diagnostic Testing (echo, CT, nuclear med)  3rd Floor: - Vacant  4th Floor: - TCTS (cardiothoracic surgery) - AFib Clinic - Structural Heart Clinic - Vascular Surgery  - Vascular Ultrasound  5th Floor: - HeartCare Cardiology (general and EP) - Clinical Pharmacy for coumadin, hypertension, lipid, weight-loss medications, and med management appointments    Valet parking services will be available as well.

## 2023-12-06 DIAGNOSIS — Z6821 Body mass index (BMI) 21.0-21.9, adult: Secondary | ICD-10-CM | POA: Diagnosis not present

## 2023-12-17 ENCOUNTER — Other Ambulatory Visit: Payer: Self-pay | Admitting: Physician Assistant

## 2023-12-17 DIAGNOSIS — I10 Essential (primary) hypertension: Secondary | ICD-10-CM

## 2024-02-12 IMAGING — MR MR ANKLE*R* W/O CM
5 series · 39 of 40 positions shown · non-contrast
Comparison: None Available.

CLINICAL DATA: Lateral ankle pain for 2-3 months.  No known injury.

EXAM:
MRI OF THE RIGHT ANKLE WITHOUT CONTRAST
TECHNIQUE: Multiplanar, multisequence MR imaging of the ankle was performed. No
intravenous contrast was administered.

[Series 4: T2 fat-sat · axial · 3.0mm · 0.50mm/px · z∈[-105,+15]mm · 8 of 32 slices shown (1 of 2)]
[im 1/32]
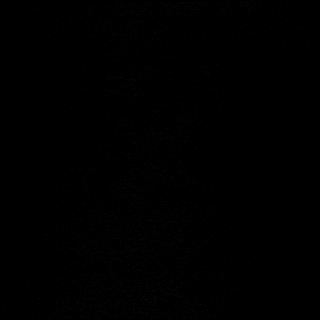
[im 5/32]
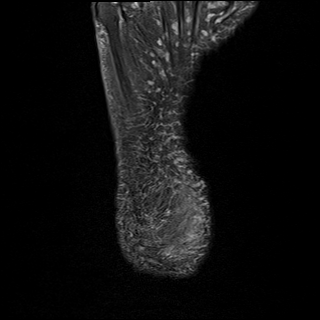
[im 9/32]
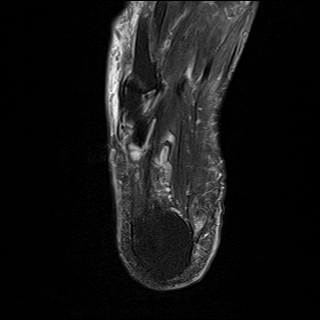
[im 14/32]
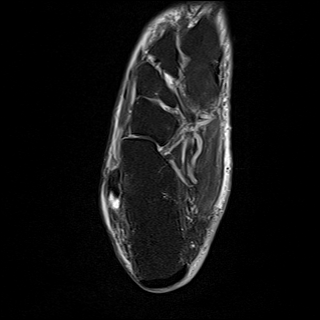
[im 18/32]
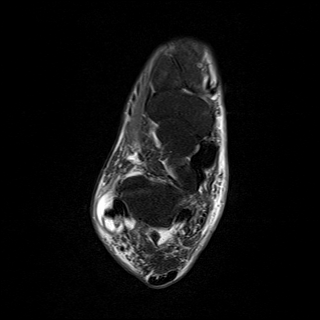
[im 23/32]
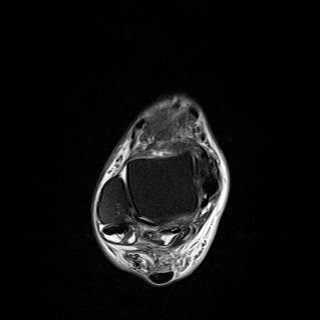
[im 27/32]
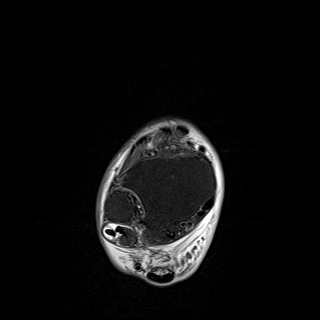
[im 32/32]
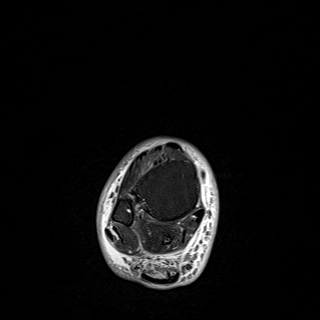

[Series 5: PD fat-sat · axial · 3.0mm · 0.50mm/px · z∈[-105,+15]mm · 9 of 32 slices shown]
[im 1/32]
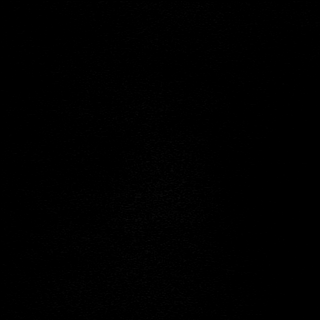
[im 4/32]
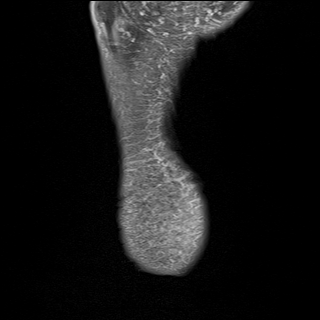
[im 8/32]
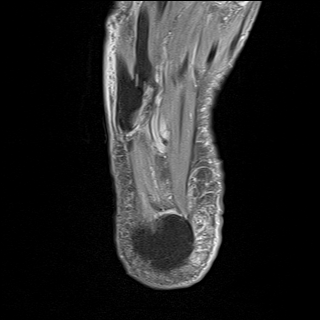
[im 12/32]
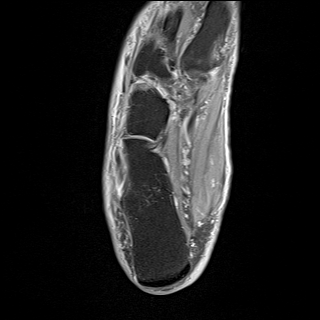
[im 16/32]
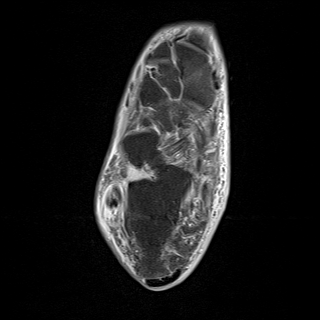
[im 20/32]
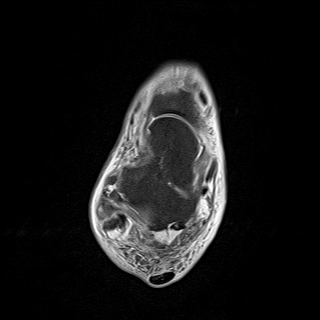
[im 24/32]
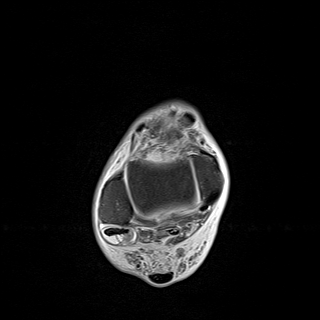
[im 28/32]
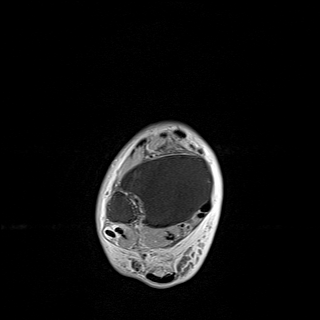
[im 32/32]
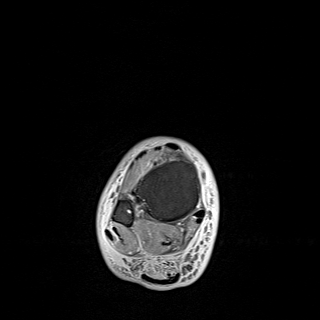

[Series 6: T1 · sagittal · 4.0mm · 0.56mm/px · 6 of 20 slices shown]
[im 1/20]
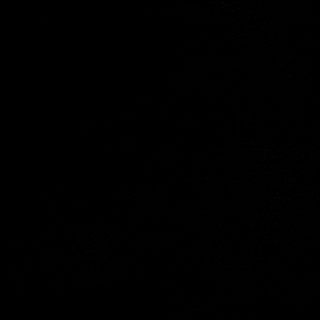
[im 4/20]
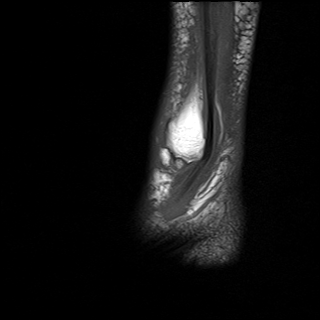
[im 8/20]
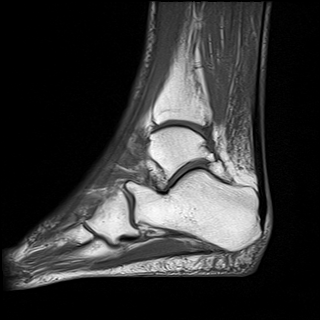
[im 12/20]
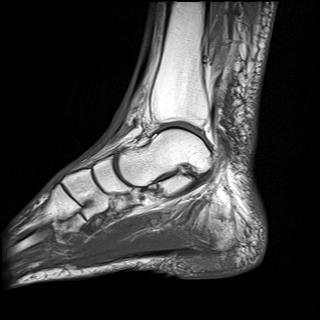
[im 16/20]
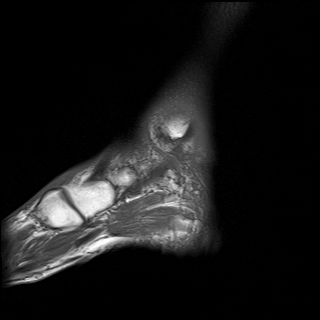
[im 20/20]
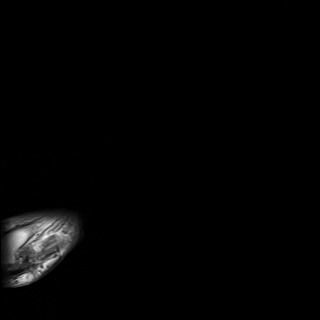

[Series 7: STIR · sagittal · 4.0mm · 0.35mm/px · 5 of 20 slices shown]
[im 1/20]
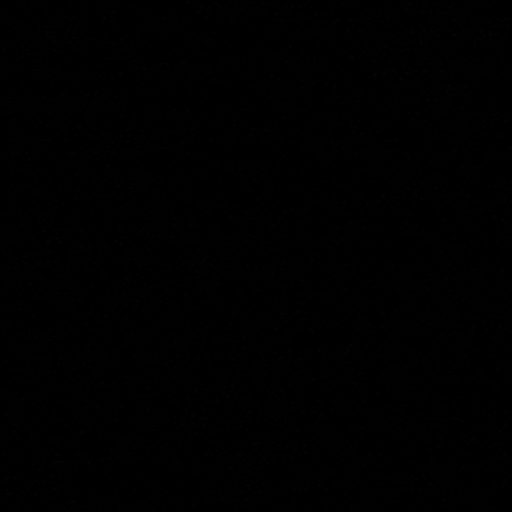
[im 4/20]
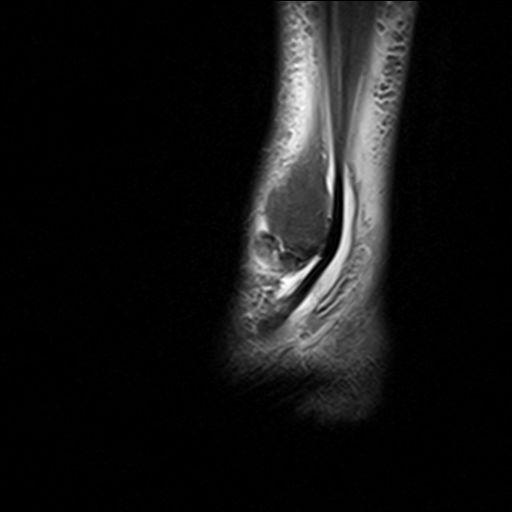
[im 8/20]
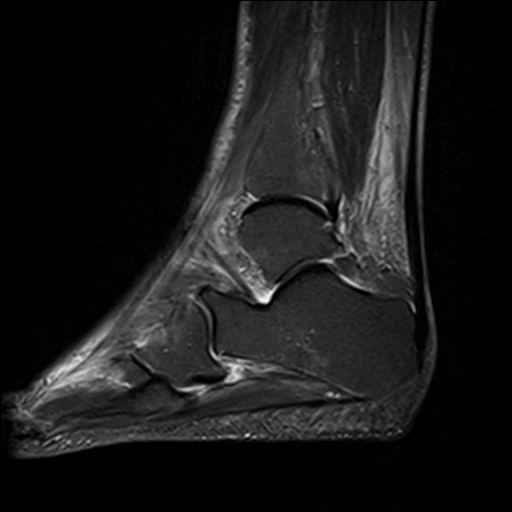
[im 12/20]
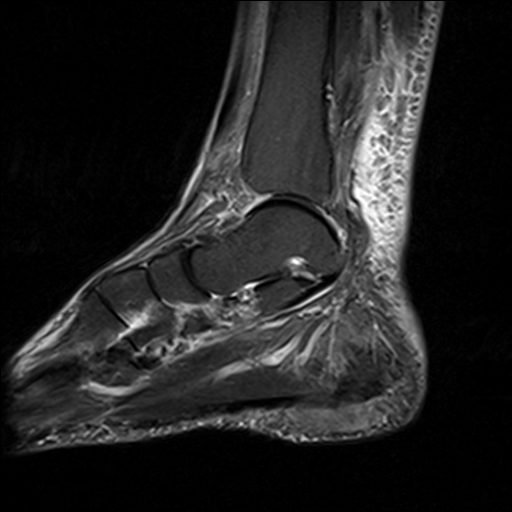
[im 16/20]
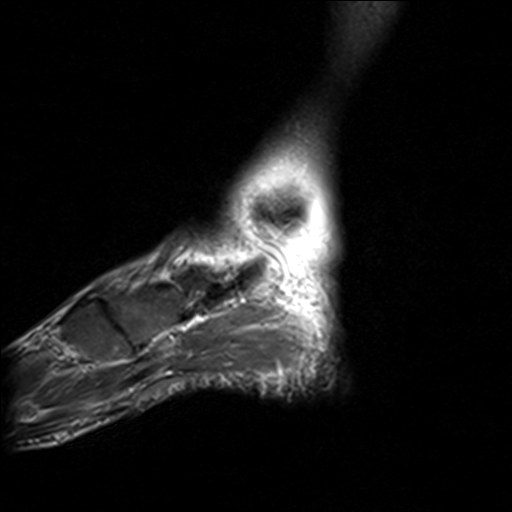

[Series 8: T2 fat-sat · coronal · 3.0mm · 0.50mm/px · 11 of 38 slices shown (2 of 2)]
[im 1/38]
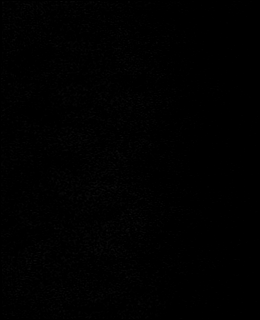
[im 4/38]
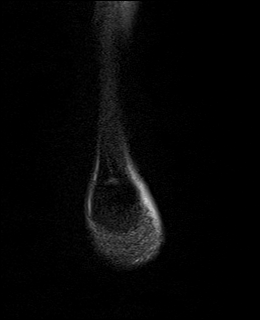
[im 8/38]
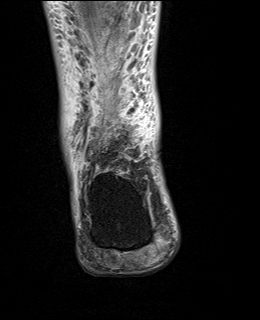
[im 12/38]
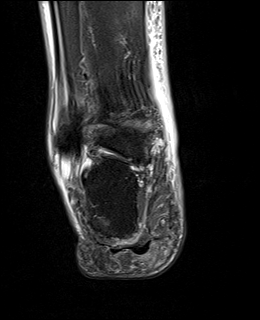
[im 15/38]
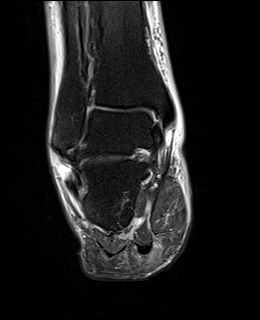
[im 19/38]
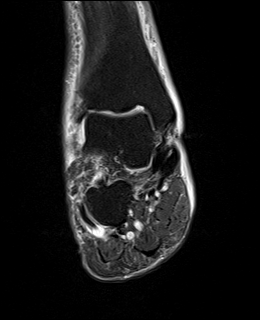
[im 23/38]
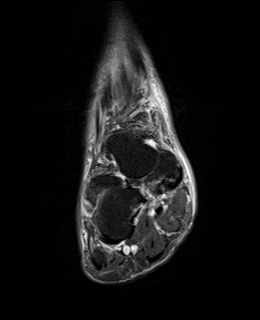
[im 26/38]
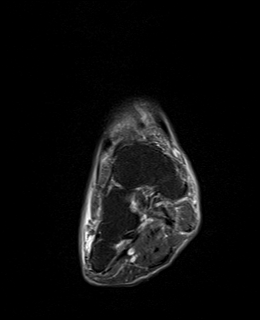
[im 30/38]
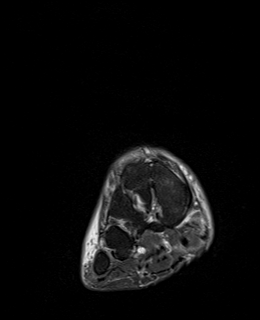
[im 34/38]
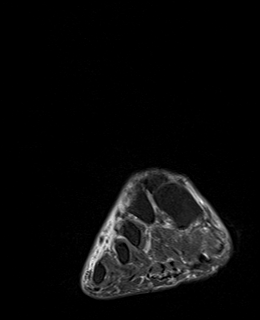
[im 38/38]
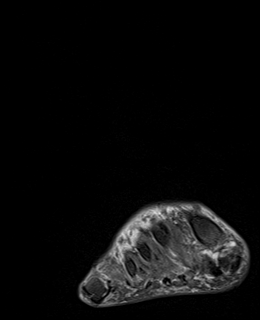

[39 of 40 positions shown; findings below may reference images not displayed]

FINDINGS: TENDONS

Peroneal: Peroneal longus tendon intact. Peroneal brevis intact.
Fluid along the peroneus brevis and longus suggesting tenosynovitis.

Posteromedial: Posterior tibial tendon intact. Flexor hallucis
longus tendon intact. Flexor digitorum longus tendon intact.

Anterior: Tibialis anterior tendon intact. Extensor hallucis longus
tendon intact Extensor digitorum longus tendon intact.

Achilles:  Intact.

Plantar Fascia: Intact.

LIGAMENTS

Lateral: Thinning of the anterior talofibular ligament with
surrounding edema suggesting ligamentous sprain without tear.
Calcaneofibular ligament intact. Posterior talofibular ligament
intact. Anterior and posterior tibiofibular ligaments intact.

Medial: Deltoid ligament intact. Spring ligament intact.

CARTILAGE

Ankle Joint: Small joint effusion. Normal ankle mortise. No chondral
defect.

Subtalar Joints/Sinus Tarsi: Normal subtalar joints. No subtalar
joint effusion. Normal sinus tarsi.

Bones: No marrow signal abnormality.  No fracture or dislocation.

Soft Tissue: Generalized subcutaneous soft tissue edema about the
medial and lateral aspect of the ankle. No fluid collection or
hematoma. Muscles are normal without edema or atrophy. Tarsal tunnel
is normal.
IMPRESSION: 1.  No evidence of fracture or dislocation.

2.  Tenosynovitis of the peroneus longus and brevis.

3. Thinning of the anterior talofibular ligament with surrounding
edema concerning for ligamentous sprain without tear.

4. Mild subcutaneous soft tissue edema about the medial and lateral
aspect of the ankle.

## 2024-02-16 DIAGNOSIS — G720 Drug-induced myopathy: Secondary | ICD-10-CM | POA: Diagnosis not present

## 2024-02-16 DIAGNOSIS — E782 Mixed hyperlipidemia: Secondary | ICD-10-CM | POA: Diagnosis not present

## 2024-02-16 DIAGNOSIS — E1169 Type 2 diabetes mellitus with other specified complication: Secondary | ICD-10-CM | POA: Diagnosis not present

## 2024-02-16 DIAGNOSIS — Z6820 Body mass index (BMI) 20.0-20.9, adult: Secondary | ICD-10-CM | POA: Diagnosis not present

## 2024-02-16 DIAGNOSIS — I10 Essential (primary) hypertension: Secondary | ICD-10-CM | POA: Diagnosis not present

## 2024-02-17 ENCOUNTER — Other Ambulatory Visit: Payer: Self-pay | Admitting: Family Medicine

## 2024-02-17 DIAGNOSIS — Z1231 Encounter for screening mammogram for malignant neoplasm of breast: Secondary | ICD-10-CM

## 2024-03-05 ENCOUNTER — Ambulatory Visit

## 2024-03-07 DIAGNOSIS — H25811 Combined forms of age-related cataract, right eye: Secondary | ICD-10-CM | POA: Diagnosis not present

## 2024-03-07 DIAGNOSIS — H35033 Hypertensive retinopathy, bilateral: Secondary | ICD-10-CM | POA: Diagnosis not present

## 2024-03-07 DIAGNOSIS — H52223 Regular astigmatism, bilateral: Secondary | ICD-10-CM | POA: Diagnosis not present

## 2024-03-07 DIAGNOSIS — H5203 Hypermetropia, bilateral: Secondary | ICD-10-CM | POA: Diagnosis not present

## 2024-03-07 DIAGNOSIS — H524 Presbyopia: Secondary | ICD-10-CM | POA: Diagnosis not present

## 2024-03-07 DIAGNOSIS — H2512 Age-related nuclear cataract, left eye: Secondary | ICD-10-CM | POA: Diagnosis not present

## 2024-03-07 DIAGNOSIS — E119 Type 2 diabetes mellitus without complications: Secondary | ICD-10-CM | POA: Diagnosis not present

## 2024-03-12 ENCOUNTER — Ambulatory Visit
Admission: RE | Admit: 2024-03-12 | Discharge: 2024-03-12 | Disposition: A | Discharge status: Home / Self Care | Source: Ambulatory Visit | Attending: Family Medicine | Admitting: Family Medicine

## 2024-03-12 DIAGNOSIS — Z1231 Encounter for screening mammogram for malignant neoplasm of breast: Secondary | ICD-10-CM

## 2024-03-16 ENCOUNTER — Other Ambulatory Visit: Payer: Self-pay | Admitting: Family Medicine

## 2024-03-16 DIAGNOSIS — R928 Other abnormal and inconclusive findings on diagnostic imaging of breast: Secondary | ICD-10-CM

## 2024-03-22 ENCOUNTER — Ambulatory Visit
Admission: RE | Admit: 2024-03-22 | Discharge: 2024-03-22 | Discharge status: Home / Self Care | Source: Ambulatory Visit | Attending: Family Medicine | Admitting: Family Medicine

## 2024-03-22 DIAGNOSIS — R928 Other abnormal and inconclusive findings on diagnostic imaging of breast: Secondary | ICD-10-CM

## 2024-03-22 DIAGNOSIS — N6011 Diffuse cystic mastopathy of right breast: Secondary | ICD-10-CM | POA: Diagnosis not present

## 2024-03-22 DIAGNOSIS — N6312 Unspecified lump in the right breast, upper inner quadrant: Secondary | ICD-10-CM | POA: Diagnosis not present

## 2024-03-26 DIAGNOSIS — M2041 Other hammer toe(s) (acquired), right foot: Secondary | ICD-10-CM | POA: Diagnosis not present

## 2024-04-02 ENCOUNTER — Telehealth: Payer: Self-pay | Admitting: *Deleted

## 2024-04-02 NOTE — Telephone Encounter (Signed)
   Pre-operative Risk Assessment    Patient Name: Melissa Waters  DOB: 1950-11-03 MRN: [id number]   Date of last office visit: 11/21/23 ORREN FABRY, PAC Date of next office visit: NONE   Request for Surgical Clearance    Procedure:  RIGHT FOOT SECOND, THIRD AND FOURTH HAMMERTOE CORRECTION WITH METATARSOPHALANGEAL JOINT CAPSULOTOMIES AND FLEXOR TENOTOMIES, WEIL OSTEOTOMIES AND EXTENSOR TENDON LENGTHENING, POSSIBLE BUNIONETTE EXCISION AND FIFTH METATARSAL OSTEOTOMY     Date of Surgery:  Clearance 04/24/24                                Surgeon:  DR. MEDFORD PAE Surgeon's Group or Practice Name: [facility] Phone number:  [phone] PAULA JORGENSEN Fax number:  [phone]   Type of Clearance Requested:   - Medical ; NONE INDICATED TO BE HELD   Type of Anesthesia:  CHOICE   Additional requests/questions:    [name]   04/02/2024, 12:51 PM

## 2024-04-02 NOTE — Telephone Encounter (Signed)
  Patient Consent for Virtual Visit     [name] has provided verbal consent on 04/02/2024 for a virtual visit (video or telephone).   CONSENT FOR VIRTUAL VISIT FOR:  Melissa Waters  By participating in this virtual visit I agree to the following:  I hereby voluntarily request, consent and authorize [facility] and its employed or contracted physicians, physician assistants, nurse practitioners or other licensed health care professionals (the Practitioner), to provide me with telemedicine health care services (the "Services) as deemed necessary by the treating Practitioner. I acknowledge and consent to receive the Services by the Practitioner via telemedicine. I understand that the telemedicine visit will involve communicating with the Practitioner through live audiovisual communication technology and the disclosure of certain medical information by electronic transmission. I acknowledge that I have been given the opportunity to request an in-person assessment or other available alternative prior to the telemedicine visit and am voluntarily participating in the telemedicine visit.  I understand that I have the right to withhold or withdraw my consent to the use of telemedicine in the course of my care at any time, without affecting my right to future care or treatment, and that the Practitioner or I may terminate the telemedicine visit at any time. I understand that I have the right to inspect all information obtained and/or recorded in the course of the telemedicine visit and may receive copies of available information for a reasonable fee.  I understand that some of the potential risks of receiving the Services via telemedicine include:  Delay or interruption in medical evaluation due to technological equipment failure or disruption; Information transmitted may not be sufficient (e.g. poor resolution of images) to allow for appropriate medical decision making by the Practitioner;  and/or  In rare instances, security protocols could fail, causing a breach of personal health information.  Furthermore, I acknowledge that it is my responsibility to provide information about my medical history, conditions and care that is complete and accurate to the best of my ability. I acknowledge that Practitioner's advice, recommendations, and/or decision may be based on factors not within their control, such as incomplete or inaccurate data provided by me or distortions of diagnostic images or specimens that may result from electronic transmissions. I understand that the practice of medicine is not an exact science and that Practitioner makes no warranties or guarantees regarding treatment outcomes. I acknowledge that a copy of this consent can be made available to me via my patient portal [facility] MyChart), or I can request a printed copy by calling the office of [facility].    I understand that my insurance will be billed for this visit.   I have read or had this consent read to me. I understand the contents of this consent, which adequately explains the benefits and risks of the Services being provided via telemedicine.  I have been provided ample opportunity to ask questions regarding this consent and the Services and have had my questions answered to my satisfaction. I give my informed consent for the services to be provided through the use of telemedicine in my medical care

## 2024-04-02 NOTE — Telephone Encounter (Signed)
   Name: Melissa Waters  DOB: 1951/03/15  MRN: [id number]  Primary Cardiologist: Powell FORBES Sorrow, MD (Inactive)   Preoperative team, please contact this patient and set up a phone call appointment for further preoperative risk assessment. Please obtain consent and complete medication review. Thank you for your help.  I confirm that guidance regarding antiplatelet and oral anticoagulation therapy has been completed and, if necessary, noted below.  None  I also confirmed the patient resides in the state of [place] . As per [facility] Medical Board telemedicine laws, the patient must reside in the state in which the provider is licensed.   Wyn Raddle, Jackee Shove, NP 04/02/2024, 1:03 PM [facility]

## 2024-04-02 NOTE — Telephone Encounter (Signed)
 Patient schedule appt 7-15

## 2024-04-17 ENCOUNTER — Ambulatory Visit: Attending: Cardiology

## 2024-04-17 DIAGNOSIS — Z01810 Encounter for preprocedural cardiovascular examination: Secondary | ICD-10-CM

## 2024-04-17 DIAGNOSIS — M25579 Pain in unspecified ankle and joints of unspecified foot: Secondary | ICD-10-CM | POA: Diagnosis not present

## 2024-04-17 DIAGNOSIS — Z5181 Encounter for therapeutic drug level monitoring: Secondary | ICD-10-CM | POA: Diagnosis not present

## 2024-04-17 DIAGNOSIS — Z681 Body mass index (BMI) 19 or less, adult: Secondary | ICD-10-CM | POA: Diagnosis not present

## 2024-04-17 DIAGNOSIS — M47819 Spondylosis without myelopathy or radiculopathy, site unspecified: Secondary | ICD-10-CM | POA: Diagnosis not present

## 2024-04-17 DIAGNOSIS — M1991 Primary osteoarthritis, unspecified site: Secondary | ICD-10-CM | POA: Diagnosis not present

## 2024-04-17 DIAGNOSIS — M25122 Fistula, left elbow: Secondary | ICD-10-CM | POA: Diagnosis not present

## 2024-04-17 NOTE — Progress Notes (Signed)
 Virtual Visit via Telephone Note   Because of Melissa Waters co-morbid illnesses, she is at least at moderate risk for complications without adequate follow up.  This format is felt to be most appropriate for this patient at this time.  Due to technical limitations with video connection (technology), today's appointment will be conducted as an audio only telehealth visit, and Melissa Waters verbally agreed to proceed in this manner.   All issues noted in this document were discussed and addressed.  No physical exam could be performed with this format.  Evaluation Performed:  Preoperative cardiovascular risk assessment _____________  Date:  04/17/2024  Patient ID:  Melissa Waters, DOB 11/02/1950, MRN [id number] Patient Location:  Home Provider location:   Office Primary Care Provider:  Okey Carlin Redbird, MD Primary Cardiologist:  Melissa FORBES Sorrow, MD (Inactive) Chief Complaint / Patient Profile  73 y.o. y/o female with a h/o nonobstructive CAD noted on coronary CTA, hypertension and hyperlipidemia who is pending right foot 2nd, 3rd and 4th hammertoe correction with metatarsophalangeal joint capsulotomies and flexor tenotomies, weil osteotomies and extensor tendon lengthening, possible bunionette excision and fifth metatarsal osteotomy and presents today for telephonic preoperative cardiovascular risk assessment. History of Present Illness  Melissa Waters is a 73 y.o. female who presents via audio/video conferencing for a telehealth visit today.  Pt was last seen in cardiology clinic on 11/21/2023 by Melissa Fabry, PA.  At that time Melissa Waters was doing well.  The patient is now pending procedure as outlined above. Since her last visit, she has remained stable from a cardiac standpoint. Today she denies chest pain, shortness of breath, lower extremity edema, fatigue, palpitations, melena, hematuria, hemoptysis, diaphoresis, weakness, presyncope, syncope, orthopnea, and PND. She is able to  achieve greater than 4 METs  of activity.  Past Medical History    Past Medical History:  Diagnosis Date   COPD (chronic obstructive pulmonary disease) (HCC)    Diabetes mellitus without complication (HCC)    no meds    Diverticulosis    Hx: of   Heart murmur    as a baby   Hypertension    Nephritis    Hx: of 1958   Osteoarthritis    right hip   Osteoarthritis of left hip 08/13/2013   Past Surgical History:  Procedure Laterality Date   COLONOSCOPY W/ POLYPECTOMY  2006   HIP CLOSED REDUCTION  11/03/2022   Procedure: CLOSED MANIPULATION HIP;  Surgeon: Melissa Waters LABOR, MD;  Location: [facility];  Service: Orthopedics;;   TOTAL HIP ARTHROPLASTY Right    05/27/2009   TOTAL HIP ARTHROPLASTY Left 08/13/2013   Procedure: TOTAL HIP ARTHROPLASTY;  Surgeon: Melissa SHAUNNA Olmsted, MD;  Location: [facility];  Service: Orthopedics;  Laterality: Left;    Allergies  Allergies  Allergen Reactions   Amoxil [Amoxicillin] Diarrhea and Nausea And Vomiting   Metformin Other (See Comments)    constipation   Pravastatin Other (See Comments)    Leg pain. Depression   Rosuvastatin  Other (See Comments)    Memory problems    Home Medications    Prior to Admission medications   Medication Sig Start Date End Date Taking? Authorizing Provider  acetaminophen  (TYLENOL ) 500 MG tablet Take 1,000 mg by mouth daily as needed (pain).    [provider]  BIOTIN PO Take 10,000 mcg by mouth daily.    [provider]  Brimonidine Tartrate (LUMIFY OP) Place 1 drop into both eyes daily.    [provider]  carvedilol  (COREG ) 3.125 MG tablet Take 1 tablet (3.125 mg total) by mouth 2 (two) times daily with a meal. 11/21/23   Melissa Melissa SAILOR, PA-C  Cholecalciferol (VITAMIN D3) 1000 units CAPS Take 1,000 Units by mouth daily.    [provider]  ezetimibe  (ZETIA ) 10 MG tablet Take 1 tablet (10 mg total) by mouth daily. 11/21/23   Melissa Melissa SAILOR, PA-C  isosorbide  mononitrate (IMDUR ) 30 MG 24  hr tablet TAKE 1 TABLET BY MOUTH EVERY DAY 12/19/23   Melissa Melissa SAILOR, PA-C  meloxicam (MOBIC) 15 MG tablet Take 15 mg by mouth daily.    [provider]  MILK THISTLE PO Take 350 mg by mouth daily.    [provider]  nitroGLYCERIN  (NITROSTAT ) 0.4 MG SL tablet Place 1 tablet (0.4 mg total) under the tongue every 5 (five) minutes as needed for chest pain. 11/21/23   Melissa Melissa SAILOR, PA-C  Probiotic Product (PHILLIPS COLON HEALTH PO) Take 1 capsule by mouth daily.    [provider]  valsartan  (DIOVAN ) 80 MG tablet Take 1 tablet (80 mg total) by mouth daily. 11/21/23   Melissa Melissa SAILOR, PA-C  vitamin C (ASCORBIC ACID) 500 MG tablet Take 500 mg by mouth daily.    [provider]    Physical Exam  Vital Signs:  Melissa Waters does not have vital signs available for review today. Given telephonic nature of communication, physical exam is limited. AAOx3. NAD. Normal affect.  Speech and respirations are unlabored. Accessory Clinical Findings  None Assessment & Plan   1.  Preoperative Cardiovascular Risk Assessment: Melissa Waters's perioperative risk of a major cardiac event is 0.9% according to the Revised Cardiac Risk Index (RCRI).  Therefore, she is at low risk for perioperative complications.   Her functional capacity is good at 5.07 METs according to the Duke Activity Status Index (DASI). Recommendations: According to ACC/AHA guidelines, no further cardiovascular testing needed.  The patient may proceed to surgery at acceptable risk.   Antiplatelet and/or Anticoagulation Recommendations: None requested.    The patient was advised that if she develops new symptoms prior to surgery to contact our office to arrange for a follow-up visit, and she verbalized understanding.  A copy of this note will be routed to requesting surgeon.  Time:   Today, I have spent 10 minutes with the patient with telehealth technology discussing medical history, symptoms, and management  plan.    Melissa Lahti D Harlea Goetzinger, NP  04/17/2024, 1:38 PM

## 2024-04-18 ENCOUNTER — Other Ambulatory Visit: Payer: Medicare Other

## 2024-04-18 ENCOUNTER — Ambulatory Visit (HOSPITAL_BASED_OUTPATIENT_CLINIC_OR_DEPARTMENT_OTHER)
Admission: RE | Admit: 2024-04-18 | Discharge: 2024-04-18 | Disposition: A | Discharge status: Home / Self Care | Source: Ambulatory Visit | Attending: Family Medicine | Admitting: Family Medicine

## 2024-04-18 DIAGNOSIS — Z13820 Encounter for screening for osteoporosis: Secondary | ICD-10-CM | POA: Insufficient documentation

## 2024-04-18 DIAGNOSIS — Z780 Asymptomatic menopausal state: Secondary | ICD-10-CM | POA: Diagnosis not present

## 2024-04-18 DIAGNOSIS — M810 Age-related osteoporosis without current pathological fracture: Secondary | ICD-10-CM | POA: Insufficient documentation

## 2024-04-18 DIAGNOSIS — M8580 Other specified disorders of bone density and structure, unspecified site: Secondary | ICD-10-CM

## 2024-04-24 DIAGNOSIS — M205X1 Other deformities of toe(s) (acquired), right foot: Secondary | ICD-10-CM | POA: Diagnosis not present

## 2024-04-24 DIAGNOSIS — M2041 Other hammer toe(s) (acquired), right foot: Secondary | ICD-10-CM | POA: Diagnosis not present

## 2024-04-24 DIAGNOSIS — G8918 Other acute postprocedural pain: Secondary | ICD-10-CM | POA: Diagnosis not present

## 2024-04-27 DIAGNOSIS — T148XXA Other injury of unspecified body region, initial encounter: Secondary | ICD-10-CM | POA: Diagnosis not present

## 2024-05-09 DIAGNOSIS — M2041 Other hammer toe(s) (acquired), right foot: Secondary | ICD-10-CM | POA: Diagnosis not present

## 2024-05-10 ENCOUNTER — Encounter: Payer: Self-pay | Admitting: Acute Care

## 2024-05-16 DIAGNOSIS — M2041 Other hammer toe(s) (acquired), right foot: Secondary | ICD-10-CM | POA: Diagnosis not present

## 2024-05-28 ENCOUNTER — Other Ambulatory Visit: Payer: Self-pay | Admitting: Acute Care

## 2024-05-28 DIAGNOSIS — F17210 Nicotine dependence, cigarettes, uncomplicated: Secondary | ICD-10-CM

## 2024-05-28 DIAGNOSIS — Z122 Encounter for screening for malignant neoplasm of respiratory organs: Secondary | ICD-10-CM

## 2024-05-28 DIAGNOSIS — Z87891 Personal history of nicotine dependence: Secondary | ICD-10-CM

## 2024-06-05 DIAGNOSIS — Z23 Encounter for immunization: Secondary | ICD-10-CM | POA: Diagnosis not present

## 2024-06-05 DIAGNOSIS — E1169 Type 2 diabetes mellitus with other specified complication: Secondary | ICD-10-CM | POA: Diagnosis not present

## 2024-06-05 DIAGNOSIS — M810 Age-related osteoporosis without current pathological fracture: Secondary | ICD-10-CM | POA: Diagnosis not present

## 2024-06-06 DIAGNOSIS — M2041 Other hammer toe(s) (acquired), right foot: Secondary | ICD-10-CM | POA: Diagnosis not present

## 2024-06-12 ENCOUNTER — Ambulatory Visit
Admission: RE | Admit: 2024-06-12 | Discharge: 2024-06-12 | Disposition: A | Discharge status: Home / Self Care | Source: Ambulatory Visit | Attending: Acute Care | Admitting: Acute Care

## 2024-06-12 DIAGNOSIS — F17210 Nicotine dependence, cigarettes, uncomplicated: Secondary | ICD-10-CM

## 2024-06-12 DIAGNOSIS — Z87891 Personal history of nicotine dependence: Secondary | ICD-10-CM

## 2024-06-12 DIAGNOSIS — Z122 Encounter for screening for malignant neoplasm of respiratory organs: Secondary | ICD-10-CM

## 2024-06-20 DIAGNOSIS — M2041 Other hammer toe(s) (acquired), right foot: Secondary | ICD-10-CM | POA: Diagnosis not present

## 2024-06-25 ENCOUNTER — Other Ambulatory Visit: Payer: Self-pay

## 2024-06-25 DIAGNOSIS — Z122 Encounter for screening for malignant neoplasm of respiratory organs: Secondary | ICD-10-CM

## 2024-06-25 DIAGNOSIS — Z87891 Personal history of nicotine dependence: Secondary | ICD-10-CM

## 2024-06-25 DIAGNOSIS — F17210 Nicotine dependence, cigarettes, uncomplicated: Secondary | ICD-10-CM

## 2024-07-25 DIAGNOSIS — M79671 Pain in right foot: Secondary | ICD-10-CM | POA: Diagnosis not present
